# Patient Record
Sex: Female | Born: 1970 | Race: Black or African American | Hispanic: No | Smoking: Former smoker
Health system: Southern US, Community
[De-identification: ages and names within clinical notes are randomized; demographics above are authoritative.]

## PROBLEM LIST (undated history)

## (undated) DIAGNOSIS — R519 Headache, unspecified: Secondary | ICD-10-CM

## (undated) DIAGNOSIS — F32A Depression, unspecified: Secondary | ICD-10-CM

## (undated) DIAGNOSIS — Z8679 Personal history of other diseases of the circulatory system: Secondary | ICD-10-CM

## (undated) DIAGNOSIS — F329 Major depressive disorder, single episode, unspecified: Secondary | ICD-10-CM

## (undated) DIAGNOSIS — K219 Gastro-esophageal reflux disease without esophagitis: Secondary | ICD-10-CM

## (undated) DIAGNOSIS — I252 Old myocardial infarction: Secondary | ICD-10-CM

## (undated) DIAGNOSIS — K589 Irritable bowel syndrome without diarrhea: Secondary | ICD-10-CM

## (undated) DIAGNOSIS — F419 Anxiety disorder, unspecified: Secondary | ICD-10-CM

## (undated) DIAGNOSIS — Z8639 Personal history of other endocrine, nutritional and metabolic disease: Secondary | ICD-10-CM

## (undated) DIAGNOSIS — Z862 Personal history of diseases of the blood and blood-forming organs and certain disorders involving the immune mechanism: Secondary | ICD-10-CM

## (undated) DIAGNOSIS — Z8759 Personal history of other complications of pregnancy, childbirth and the puerperium: Secondary | ICD-10-CM

## (undated) HISTORY — DX: Depression, unspecified: F32.A

## (undated) HISTORY — DX: Gastro-esophageal reflux disease without esophagitis: K21.9

## (undated) HISTORY — DX: Major depressive disorder, single episode, unspecified: F32.9

## (undated) HISTORY — PX: ANTERIOR CRUCIATE LIGAMENT REPAIR: SHX115

## (undated) HISTORY — PX: CARDIAC CATHETERIZATION: SHX172

## (undated) HISTORY — DX: Irritable bowel syndrome, unspecified: K58.9

---

## 1999-10-23 ENCOUNTER — Encounter: Payer: Self-pay | Admitting: Gastroenterology

## 1999-10-23 ENCOUNTER — Ambulatory Visit (HOSPITAL_COMMUNITY): Admission: RE | Admit: 1999-10-23 | Discharge: 1999-10-23 | Payer: Self-pay | Admitting: Gastroenterology

## 1999-10-24 ENCOUNTER — Ambulatory Visit (HOSPITAL_COMMUNITY): Admission: RE | Admit: 1999-10-24 | Discharge: 1999-10-24 | Payer: Self-pay | Admitting: Gastroenterology

## 1999-10-24 ENCOUNTER — Encounter: Payer: Self-pay | Admitting: Gastroenterology

## 2001-02-07 ENCOUNTER — Other Ambulatory Visit: Admission: RE | Admit: 2001-02-07 | Discharge: 2001-02-07 | Payer: Self-pay | Admitting: Family Medicine

## 2002-03-21 ENCOUNTER — Encounter: Payer: Self-pay | Admitting: Internal Medicine

## 2002-03-21 ENCOUNTER — Ambulatory Visit (HOSPITAL_COMMUNITY): Admission: RE | Admit: 2002-03-21 | Discharge: 2002-03-21 | Payer: Self-pay | Admitting: Internal Medicine

## 2002-08-08 ENCOUNTER — Encounter: Payer: Self-pay | Admitting: *Deleted

## 2002-08-08 ENCOUNTER — Inpatient Hospital Stay (HOSPITAL_COMMUNITY): Admission: AD | Admit: 2002-08-08 | Discharge: 2002-08-08 | Payer: Self-pay | Admitting: *Deleted

## 2002-08-31 ENCOUNTER — Ambulatory Visit (HOSPITAL_COMMUNITY): Admission: RE | Admit: 2002-08-31 | Discharge: 2002-08-31 | Payer: Self-pay | Admitting: *Deleted

## 2002-10-26 ENCOUNTER — Ambulatory Visit (HOSPITAL_COMMUNITY): Admission: RE | Admit: 2002-10-26 | Discharge: 2002-10-26 | Payer: Self-pay | Admitting: *Deleted

## 2002-11-23 ENCOUNTER — Inpatient Hospital Stay (HOSPITAL_COMMUNITY): Admission: AD | Admit: 2002-11-23 | Discharge: 2002-11-23 | Payer: Self-pay | Admitting: *Deleted

## 2002-12-10 DIAGNOSIS — Z8679 Personal history of other diseases of the circulatory system: Secondary | ICD-10-CM

## 2002-12-10 DIAGNOSIS — I10 Essential (primary) hypertension: Secondary | ICD-10-CM

## 2002-12-10 HISTORY — DX: Essential (primary) hypertension: I10

## 2002-12-10 HISTORY — DX: Personal history of other diseases of the circulatory system: Z86.79

## 2003-02-01 ENCOUNTER — Inpatient Hospital Stay (HOSPITAL_COMMUNITY): Admission: AD | Admit: 2003-02-01 | Discharge: 2003-02-01 | Payer: Self-pay | Admitting: *Deleted

## 2003-02-04 ENCOUNTER — Inpatient Hospital Stay (HOSPITAL_COMMUNITY): Admission: AD | Admit: 2003-02-04 | Discharge: 2003-02-13 | Payer: Self-pay | Admitting: *Deleted

## 2003-02-05 ENCOUNTER — Encounter: Payer: Self-pay | Admitting: Pulmonary Disease

## 2003-02-05 ENCOUNTER — Encounter: Payer: Self-pay | Admitting: Family Medicine

## 2003-02-05 DIAGNOSIS — Z8759 Personal history of other complications of pregnancy, childbirth and the puerperium: Secondary | ICD-10-CM

## 2003-02-05 DIAGNOSIS — Z862 Personal history of diseases of the blood and blood-forming organs and certain disorders involving the immune mechanism: Secondary | ICD-10-CM

## 2003-02-05 HISTORY — DX: Personal history of other complications of pregnancy, childbirth and the puerperium: Z87.59

## 2003-02-05 HISTORY — DX: Personal history of diseases of the blood and blood-forming organs and certain disorders involving the immune mechanism: Z86.2

## 2003-02-06 ENCOUNTER — Encounter: Payer: Self-pay | Admitting: Critical Care Medicine

## 2003-02-07 ENCOUNTER — Encounter: Payer: Self-pay | Admitting: Pulmonary Disease

## 2003-02-08 ENCOUNTER — Encounter: Payer: Self-pay | Admitting: Pulmonary Disease

## 2003-02-08 ENCOUNTER — Encounter: Payer: Self-pay | Admitting: Cardiology

## 2003-02-09 ENCOUNTER — Encounter: Payer: Self-pay | Admitting: Pulmonary Disease

## 2003-02-10 ENCOUNTER — Encounter: Payer: Self-pay | Admitting: Pulmonary Disease

## 2003-02-15 ENCOUNTER — Inpatient Hospital Stay (HOSPITAL_COMMUNITY): Admission: AD | Admit: 2003-02-15 | Discharge: 2003-02-23 | Payer: Self-pay | Admitting: *Deleted

## 2003-02-25 ENCOUNTER — Encounter: Admission: RE | Admit: 2003-02-25 | Discharge: 2003-02-25 | Payer: Self-pay | Admitting: Obstetrics and Gynecology

## 2003-04-02 ENCOUNTER — Encounter: Admission: RE | Admit: 2003-04-02 | Discharge: 2003-04-02 | Payer: Self-pay | Admitting: Family Medicine

## 2003-04-16 ENCOUNTER — Encounter: Admission: RE | Admit: 2003-04-16 | Discharge: 2003-04-16 | Payer: Self-pay | Admitting: Family Medicine

## 2003-04-20 ENCOUNTER — Ambulatory Visit (HOSPITAL_BASED_OUTPATIENT_CLINIC_OR_DEPARTMENT_OTHER): Admission: RE | Admit: 2003-04-20 | Discharge: 2003-04-21 | Payer: Self-pay | Admitting: Orthopaedic Surgery

## 2003-05-06 ENCOUNTER — Encounter: Admission: RE | Admit: 2003-05-06 | Discharge: 2003-05-06 | Payer: Self-pay | Admitting: Obstetrics and Gynecology

## 2003-05-19 ENCOUNTER — Encounter: Admission: RE | Admit: 2003-05-19 | Discharge: 2003-06-07 | Payer: Self-pay | Admitting: Orthopaedic Surgery

## 2003-06-23 ENCOUNTER — Ambulatory Visit (HOSPITAL_COMMUNITY): Admission: RE | Admit: 2003-06-23 | Discharge: 2003-06-23 | Payer: Self-pay | Admitting: Cardiology

## 2003-06-23 ENCOUNTER — Encounter: Payer: Self-pay | Admitting: Cardiology

## 2003-07-01 ENCOUNTER — Ambulatory Visit (HOSPITAL_COMMUNITY): Admission: RE | Admit: 2003-07-01 | Discharge: 2003-07-01 | Payer: Self-pay | Admitting: Cardiology

## 2004-02-07 IMAGING — CT CT ABDOMEN W/ CM
1 series · 15 of 32 positions shown, 19 images · non-contrast
Comparison: none

FINDINGS
CLINICAL DATA: 31 YEAR OLD WITH  PELVIS PAIN, ELEVATED WHITE COUNT. PATENT IS STATUS POST VAGINAL
DELIVERY OF 02/05/03.
EXAMINATION OF THE LUNG BASES DEMONSTRATE SMALL BILATERAL PLEURAL EFFUSIONS.  THERE IS BIBASILAR
ATELECTASIS AND PATCHY RIGHT LOWER LOBE INFILTRATE.
CT ABDOMEN
THE LIVER, SPLEEN, PANCREAS, ADRENAL GLANDS AND KIDNEYS DEMONSTRATE NO SIGNIFICANT FINDINGS.  THERE
IS A TINY CYST IN THE LEFT KIDNEY.  THE RIGHT OVARIAN VEIN IS ENLARGED AND APPEARS TO BE CLOT
WITHIN IT.   THE LEFT OVARIAN VEIN IS NOT AS ENLARGED BUT MAY ALSO HAVE A SMALL AMOUNT OF CLOT
WITHIN IT. THE FINDINGS MAY SUGGEST INFECTIOUS THROMBOPHLEBITIS OF THE RIGHT OVARIAN VEIN.   NO
MESENTERIC OR RETROPERITONEAL MASSES OR ADENOPATHY.
IMPRESSION
1.  SMALL BILATERAL PLEURAL EFFUSIONS, BIBASILAR ATELECTASIS AND PROBABLE RIGHT BASILAR INFILTRATE.
2.  RIGHT OVARIAN VEIN THROMBOSIS MAY BE AN INFECTIOUS THROMBOPHLEBITIS.   THE LEFT OVARIAN VEIN
MAY ALSO HAVE SOME THROMBUS IN IT.
CT PELVIS
THE UTERUS IS STILL ENLARGED. THERE IS FLUID AND SOME AIR IN THE ENDOMETRIUM WHICH IS NOT AN
EXPECTED FINDING THIS FAR OUT AFTER DELIVERY AND ENDOMETRITIS WOULD BE A CONSIDERATION.   NO FREE
PELVIC FLUID COLLECTIONS OR DRAINABLE PELVIC ABSCESS.  THE RECTUM AND SIGMOID COLON ARE
UNREMARKABLE. THERE IS A CYSTIC STRUCTURE IN THE RIGHT LABIA.
1.  RIGHT OVARIAN VEIN THROMBOSIS.  THERE MAY BE SOME CLOT IN THE LEFT OVARIAN VEIN ALSO.  ENLARGED
UTERUS WITH SMALL AMOUNT OF AIR AND SOME FLUID IN THE ENDOMETRIUM COULD NOT EXCLUDE ENDOMETRITIS.
2.  NO PELVIC ABSCESS.

[Series 2: abd pelvis · axial · 0.75mm/px · z∈[-288,+62]mm · 15 of 106 slices shown, 19 images]
[im 7/106  soft-tissue]
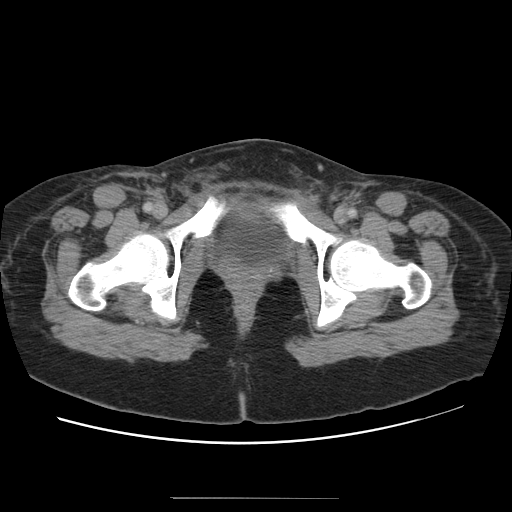
[im 7/106  bone]
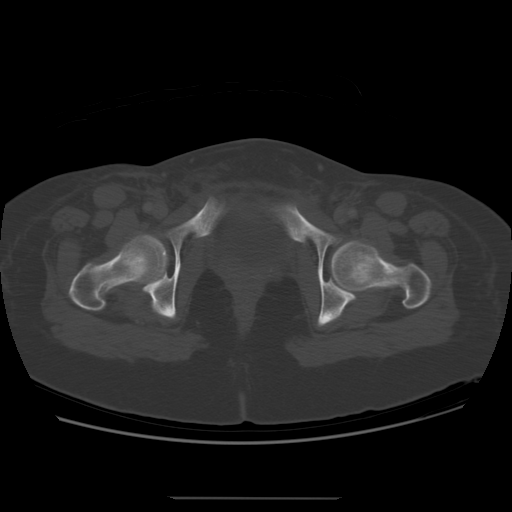
[im 14/106  soft-tissue]
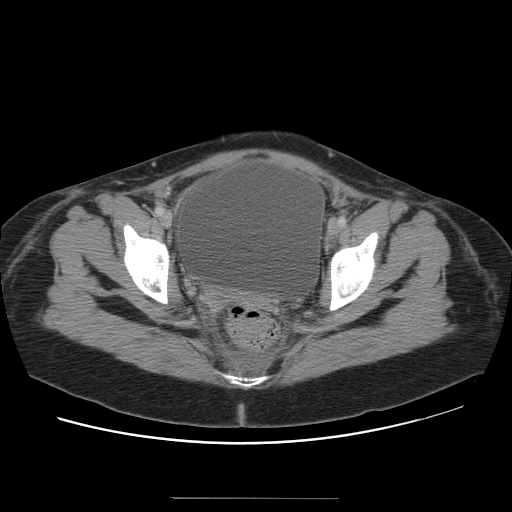
[im 21/106  soft-tissue]
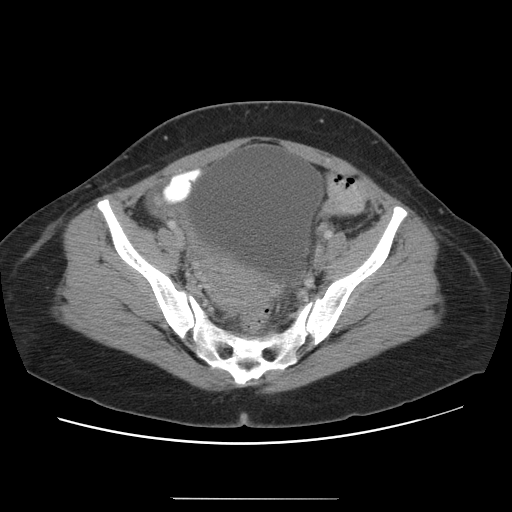
[im 31/106  soft-tissue]
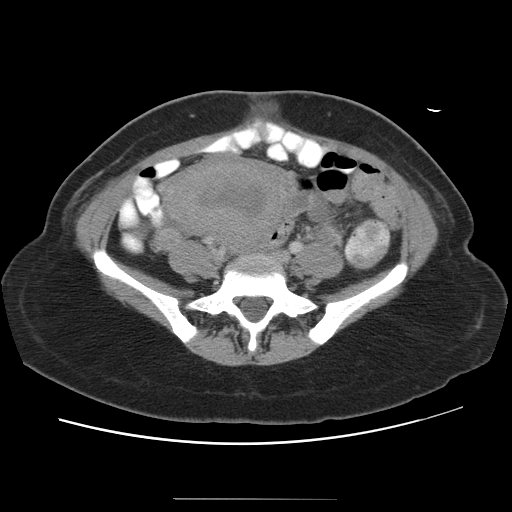
[im 38/106  soft-tissue]
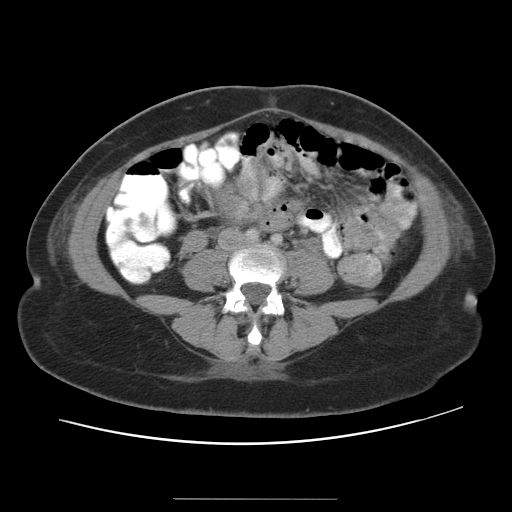
[im 45/106  soft-tissue]
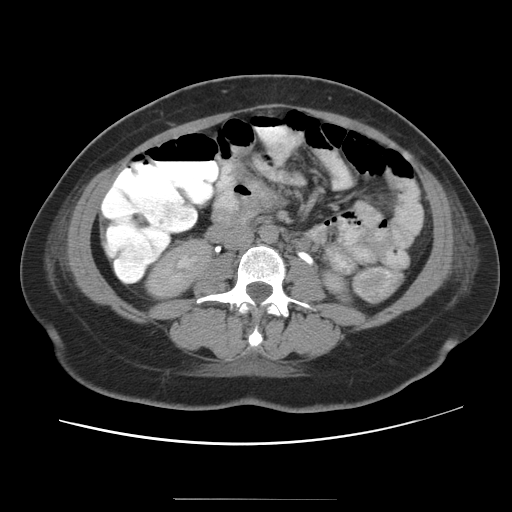
[im 55/106  soft-tissue]
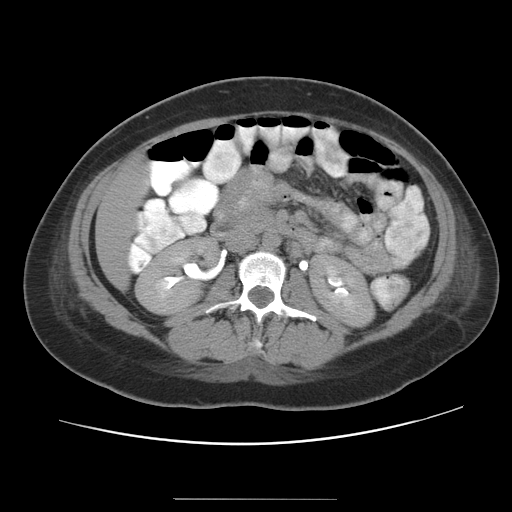
[im 61/106  soft-tissue]
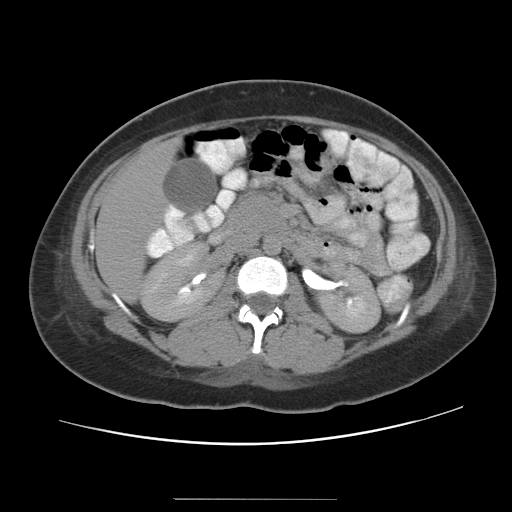
[im 68/106  soft-tissue]
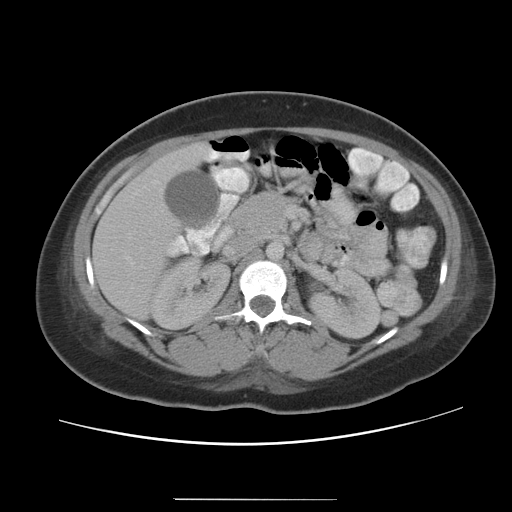
[im 68/106  bone]
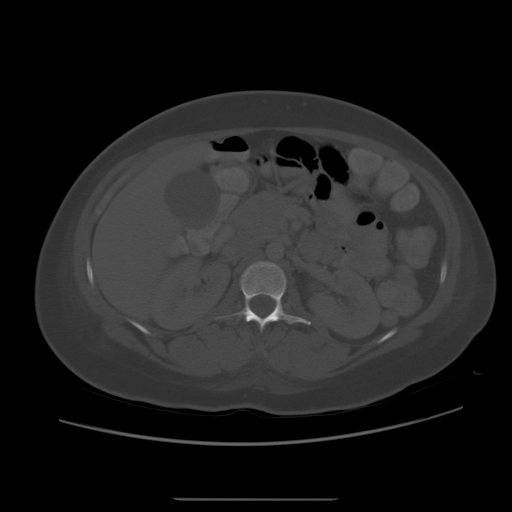
[im 75/106  soft-tissue]
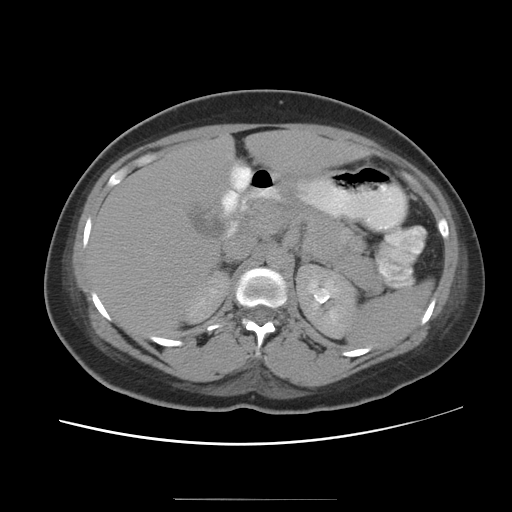
[im 85/106  soft-tissue]
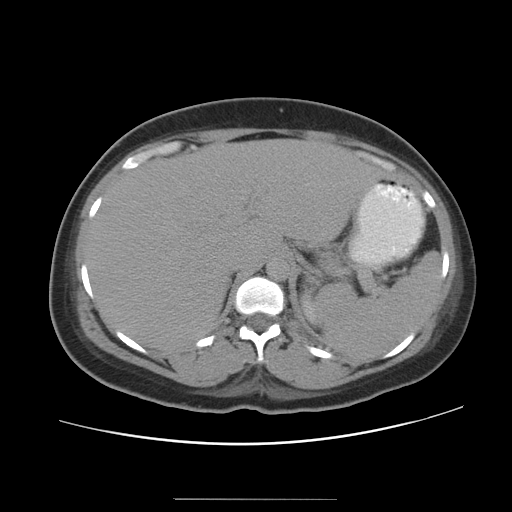
[im 92/106  soft-tissue]
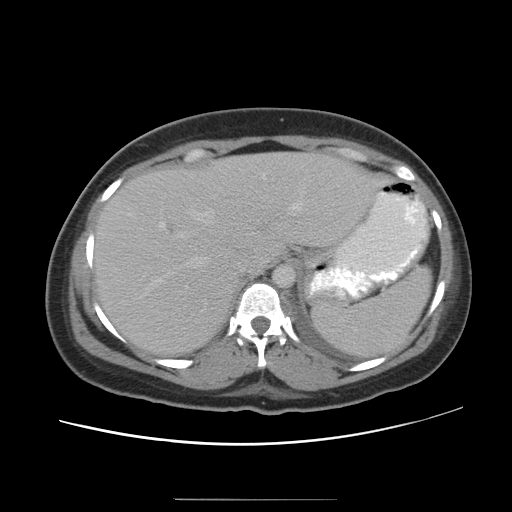
[im 92/106  lung]
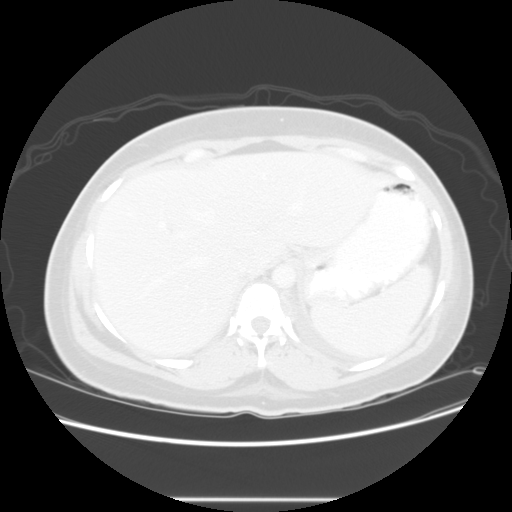
[im 95/106  lung]
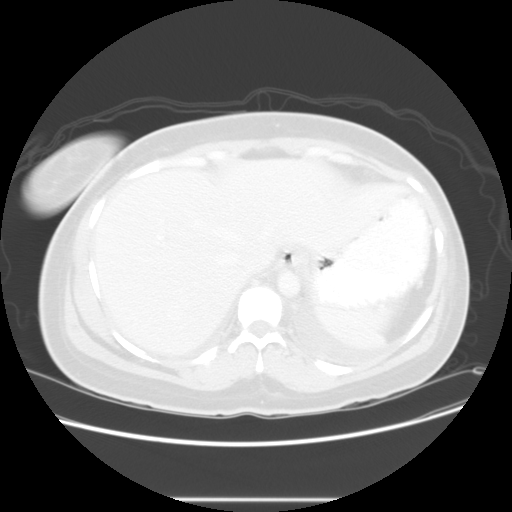
[im 99/106  soft-tissue]
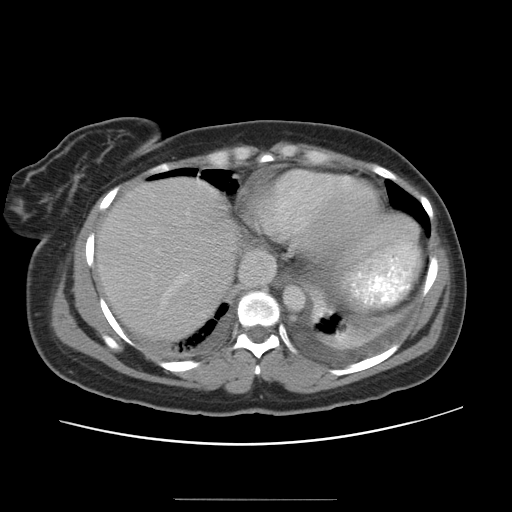
[im 99/106  lung]
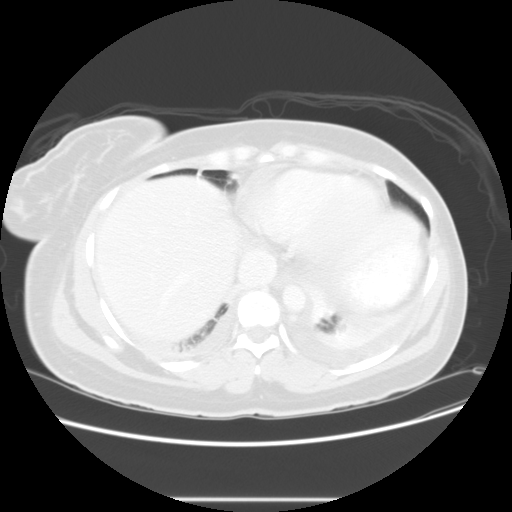
[im 102/106  lung]
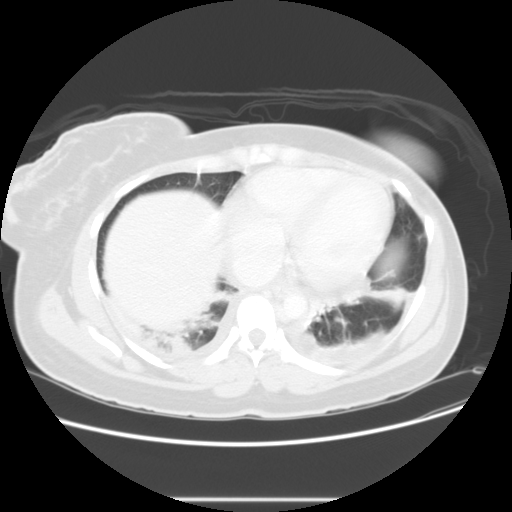

[15 of 32 positions shown; findings below may reference images not displayed]

## 2004-11-10 ENCOUNTER — Ambulatory Visit: Payer: Self-pay | Admitting: Family Medicine

## 2004-12-01 ENCOUNTER — Ambulatory Visit: Payer: Self-pay | Admitting: Family Medicine

## 2004-12-12 ENCOUNTER — Ambulatory Visit: Payer: Self-pay | Admitting: Internal Medicine

## 2004-12-13 ENCOUNTER — Ambulatory Visit: Payer: Self-pay | Admitting: Family Medicine

## 2004-12-13 ENCOUNTER — Ambulatory Visit: Payer: Self-pay | Admitting: *Deleted

## 2004-12-26 ENCOUNTER — Ambulatory Visit: Payer: Self-pay | Admitting: Family Medicine

## 2004-12-27 ENCOUNTER — Ambulatory Visit: Payer: Self-pay | Admitting: Family Medicine

## 2005-01-29 ENCOUNTER — Ambulatory Visit: Payer: Self-pay | Admitting: Nurse Practitioner

## 2005-05-03 ENCOUNTER — Ambulatory Visit: Payer: Self-pay | Admitting: Nurse Practitioner

## 2005-08-24 ENCOUNTER — Ambulatory Visit: Payer: Self-pay | Admitting: Internal Medicine

## 2005-09-20 ENCOUNTER — Ambulatory Visit: Payer: Self-pay | Admitting: Nurse Practitioner

## 2005-11-22 ENCOUNTER — Ambulatory Visit: Payer: Self-pay | Admitting: Family Medicine

## 2005-11-28 ENCOUNTER — Ambulatory Visit: Payer: Self-pay | Admitting: Nurse Practitioner

## 2006-01-29 ENCOUNTER — Ambulatory Visit: Payer: Self-pay | Admitting: Family Medicine

## 2006-01-31 ENCOUNTER — Ambulatory Visit: Payer: Self-pay | Admitting: Nurse Practitioner

## 2006-02-18 ENCOUNTER — Encounter (INDEPENDENT_AMBULATORY_CARE_PROVIDER_SITE_OTHER): Payer: Self-pay | Admitting: Nurse Practitioner

## 2006-02-26 ENCOUNTER — Ambulatory Visit: Payer: Self-pay | Admitting: Nurse Practitioner

## 2006-03-06 ENCOUNTER — Ambulatory Visit (HOSPITAL_COMMUNITY): Admission: RE | Admit: 2006-03-06 | Discharge: 2006-03-06 | Payer: Self-pay | Admitting: Family Medicine

## 2006-03-19 ENCOUNTER — Ambulatory Visit: Payer: Self-pay | Admitting: Nurse Practitioner

## 2006-03-21 ENCOUNTER — Encounter: Admission: RE | Admit: 2006-03-21 | Discharge: 2006-03-21 | Payer: Self-pay | Admitting: Nurse Practitioner

## 2006-08-15 ENCOUNTER — Ambulatory Visit: Payer: Self-pay | Admitting: Nurse Practitioner

## 2006-08-19 ENCOUNTER — Encounter (INDEPENDENT_AMBULATORY_CARE_PROVIDER_SITE_OTHER): Payer: Self-pay | Admitting: Nurse Practitioner

## 2006-08-19 LAB — CONVERTED CEMR LAB: TSH: 2.233 microintl units/mL

## 2006-08-26 ENCOUNTER — Ambulatory Visit (HOSPITAL_COMMUNITY): Admission: RE | Admit: 2006-08-26 | Discharge: 2006-08-26 | Payer: Self-pay | Admitting: Family Medicine

## 2006-09-27 ENCOUNTER — Ambulatory Visit: Payer: Self-pay | Admitting: Cardiology

## 2006-10-11 ENCOUNTER — Ambulatory Visit: Payer: Self-pay

## 2006-10-18 ENCOUNTER — Ambulatory Visit: Payer: Self-pay | Admitting: Cardiology

## 2006-10-24 ENCOUNTER — Ambulatory Visit: Payer: Self-pay | Admitting: Nurse Practitioner

## 2006-11-07 ENCOUNTER — Encounter (INDEPENDENT_AMBULATORY_CARE_PROVIDER_SITE_OTHER): Payer: Self-pay | Admitting: Gynecology

## 2006-11-07 ENCOUNTER — Ambulatory Visit: Payer: Self-pay | Admitting: Gynecology

## 2007-03-17 ENCOUNTER — Ambulatory Visit: Payer: Self-pay | Admitting: Nurse Practitioner

## 2007-03-17 LAB — CONVERTED CEMR LAB
RBC count: 4.05 10*6/uL
WBC, blood: 7 10*3/uL

## 2007-06-18 ENCOUNTER — Ambulatory Visit (HOSPITAL_COMMUNITY): Admission: RE | Admit: 2007-06-18 | Discharge: 2007-06-18 | Payer: Self-pay | Admitting: Family Medicine

## 2007-06-26 ENCOUNTER — Ambulatory Visit: Payer: Self-pay | Admitting: Gynecology

## 2007-07-17 ENCOUNTER — Ambulatory Visit: Payer: Self-pay | Admitting: Family Medicine

## 2007-07-30 ENCOUNTER — Encounter: Payer: Self-pay | Admitting: Nurse Practitioner

## 2007-07-30 DIAGNOSIS — F339 Major depressive disorder, recurrent, unspecified: Secondary | ICD-10-CM | POA: Insufficient documentation

## 2007-07-30 DIAGNOSIS — O8813 Amniotic fluid embolism in the puerperium: Secondary | ICD-10-CM | POA: Insufficient documentation

## 2007-07-30 DIAGNOSIS — I252 Old myocardial infarction: Secondary | ICD-10-CM

## 2007-07-30 DIAGNOSIS — Z8674 Personal history of sudden cardiac arrest: Secondary | ICD-10-CM

## 2007-07-30 DIAGNOSIS — E039 Hypothyroidism, unspecified: Secondary | ICD-10-CM

## 2007-07-30 DIAGNOSIS — F329 Major depressive disorder, single episode, unspecified: Secondary | ICD-10-CM

## 2007-07-31 ENCOUNTER — Ambulatory Visit: Payer: Self-pay | Admitting: Obstetrics & Gynecology

## 2007-08-14 ENCOUNTER — Ambulatory Visit: Payer: Self-pay | Admitting: *Deleted

## 2007-08-27 ENCOUNTER — Encounter (INDEPENDENT_AMBULATORY_CARE_PROVIDER_SITE_OTHER): Payer: Self-pay | Admitting: *Deleted

## 2007-08-28 ENCOUNTER — Ambulatory Visit: Payer: Self-pay | Admitting: Family Medicine

## 2007-09-04 ENCOUNTER — Ambulatory Visit: Payer: Self-pay | Admitting: Obstetrics and Gynecology

## 2007-09-04 ENCOUNTER — Inpatient Hospital Stay (HOSPITAL_COMMUNITY): Admission: AD | Admit: 2007-09-04 | Discharge: 2007-09-05 | Payer: Self-pay | Admitting: Obstetrics & Gynecology

## 2007-09-10 ENCOUNTER — Ambulatory Visit (HOSPITAL_COMMUNITY): Admission: RE | Admit: 2007-09-10 | Discharge: 2007-09-10 | Payer: Self-pay | Admitting: Obstetrics & Gynecology

## 2007-09-15 ENCOUNTER — Ambulatory Visit: Payer: Self-pay | Admitting: Family Medicine

## 2007-09-29 ENCOUNTER — Ambulatory Visit: Payer: Self-pay | Admitting: *Deleted

## 2007-10-13 ENCOUNTER — Ambulatory Visit: Payer: Self-pay | Admitting: Obstetrics & Gynecology

## 2007-10-20 ENCOUNTER — Ambulatory Visit: Payer: Self-pay | Admitting: Obstetrics & Gynecology

## 2007-10-27 ENCOUNTER — Ambulatory Visit: Payer: Self-pay | Admitting: Obstetrics & Gynecology

## 2007-11-03 ENCOUNTER — Ambulatory Visit: Payer: Self-pay | Admitting: Obstetrics & Gynecology

## 2007-11-10 ENCOUNTER — Ambulatory Visit: Payer: Self-pay | Admitting: *Deleted

## 2007-11-17 ENCOUNTER — Inpatient Hospital Stay (HOSPITAL_COMMUNITY): Admission: AD | Admit: 2007-11-17 | Discharge: 2007-11-19 | Payer: Self-pay | Admitting: Obstetrics & Gynecology

## 2007-11-17 ENCOUNTER — Ambulatory Visit: Payer: Self-pay | Admitting: Obstetrics & Gynecology

## 2007-11-17 ENCOUNTER — Ambulatory Visit: Payer: Self-pay | Admitting: Obstetrics and Gynecology

## 2007-11-25 ENCOUNTER — Encounter: Payer: Self-pay | Admitting: Obstetrics & Gynecology

## 2007-11-25 ENCOUNTER — Ambulatory Visit: Payer: Self-pay | Admitting: Obstetrics & Gynecology

## 2007-11-25 ENCOUNTER — Ambulatory Visit (HOSPITAL_COMMUNITY): Admission: AD | Admit: 2007-11-25 | Discharge: 2007-11-26 | Payer: Self-pay | Admitting: Obstetrics & Gynecology

## 2008-04-02 ENCOUNTER — Ambulatory Visit: Payer: Self-pay | Admitting: Internal Medicine

## 2008-12-01 ENCOUNTER — Ambulatory Visit: Payer: Self-pay | Admitting: Internal Medicine

## 2008-12-01 ENCOUNTER — Encounter (INDEPENDENT_AMBULATORY_CARE_PROVIDER_SITE_OTHER): Payer: Self-pay | Admitting: Internal Medicine

## 2008-12-01 LAB — CONVERTED CEMR LAB
ALT: 11 units/L (ref 0–35)
AST: 16 units/L (ref 0–37)
Alkaline Phosphatase: 76 units/L (ref 39–117)
Basophils Absolute: 0 10*3/uL (ref 0.0–0.1)
CO2: 24 meq/L (ref 19–32)
Eosinophils Relative: 2 % (ref 0–5)
HCT: 40.7 % (ref 36.0–46.0)
Lymphocytes Relative: 22 % (ref 12–46)
Lymphs Abs: 1.4 10*3/uL (ref 0.7–4.0)
MCHC: 31.9 g/dL (ref 30.0–36.0)
MCV: 94.9 fL (ref 78.0–100.0)
Monocytes Absolute: 0.5 10*3/uL (ref 0.1–1.0)
Neutro Abs: 4.2 10*3/uL (ref 1.7–7.7)
Neutrophils Relative %: 67 % (ref 43–77)
RDW: 14.3 % (ref 11.5–15.5)
Total Bilirubin: 0.5 mg/dL (ref 0.3–1.2)
WBC: 6.2 10*3/uL (ref 4.0–10.5)

## 2009-01-11 ENCOUNTER — Ambulatory Visit: Payer: Self-pay | Admitting: Internal Medicine

## 2009-05-04 ENCOUNTER — Ambulatory Visit: Payer: Self-pay | Admitting: Internal Medicine

## 2009-06-08 ENCOUNTER — Ambulatory Visit: Payer: Self-pay | Admitting: Family Medicine

## 2009-06-14 ENCOUNTER — Ambulatory Visit: Payer: Self-pay | Admitting: Internal Medicine

## 2009-08-12 ENCOUNTER — Emergency Department (HOSPITAL_COMMUNITY): Admission: EM | Admit: 2009-08-12 | Discharge: 2009-08-12 | Payer: Self-pay | Admitting: Emergency Medicine

## 2009-08-24 ENCOUNTER — Ambulatory Visit: Payer: Self-pay | Admitting: Internal Medicine

## 2009-08-24 ENCOUNTER — Encounter (INDEPENDENT_AMBULATORY_CARE_PROVIDER_SITE_OTHER): Payer: Self-pay | Admitting: Internal Medicine

## 2010-03-03 ENCOUNTER — Ambulatory Visit: Payer: Self-pay | Admitting: Internal Medicine

## 2010-03-28 ENCOUNTER — Ambulatory Visit: Payer: Self-pay | Admitting: Internal Medicine

## 2010-03-28 LAB — CONVERTED CEMR LAB
Direct LDL: 69 mg/dL
Helicobacter Pylori Antibody-IgG: <0.4

## 2010-04-19 ENCOUNTER — Ambulatory Visit: Payer: Self-pay | Admitting: Internal Medicine

## 2010-05-04 ENCOUNTER — Ambulatory Visit: Payer: Self-pay | Admitting: Internal Medicine

## 2010-05-09 ENCOUNTER — Encounter (INDEPENDENT_AMBULATORY_CARE_PROVIDER_SITE_OTHER): Payer: Self-pay | Admitting: Interventional Cardiology

## 2010-05-09 ENCOUNTER — Ambulatory Visit (HOSPITAL_COMMUNITY): Admission: RE | Admit: 2010-05-09 | Discharge: 2010-05-09 | Payer: Self-pay | Admitting: Interventional Cardiology

## 2010-05-09 HISTORY — PX: TRANSTHORACIC ECHOCARDIOGRAM: SHX275

## 2010-06-06 ENCOUNTER — Ambulatory Visit: Payer: Self-pay | Admitting: Internal Medicine

## 2010-06-06 LAB — CONVERTED CEMR LAB
Basophils Relative: 0 % (ref 0–1)
Eosinophils Relative: 2 % (ref 0–5)
Ferritin: 28 ng/mL (ref 10–291)
IgE (Immunoglobulin E), Serum: 16.2 intl units/mL (ref 0.0–180.0)
Iron: 77 ug/dL (ref 42–145)
Lymphocytes Relative: 28 % (ref 12–46)
Lymphs Abs: 1.9 10*3/uL (ref 0.7–4.0)
MCHC: 32.4 g/dL (ref 30.0–36.0)
Monocytes Absolute: 0.3 10*3/uL (ref 0.1–1.0)
Neutro Abs: 4.4 10*3/uL (ref 1.7–7.7)
Neutrophils Relative %: 65 % (ref 43–77)
RDW: 15.7 % — ABNORMAL HIGH (ref 11.5–15.5)

## 2010-06-08 ENCOUNTER — Ambulatory Visit (HOSPITAL_COMMUNITY): Admission: RE | Admit: 2010-06-08 | Discharge: 2010-06-08 | Payer: Self-pay | Admitting: Internal Medicine

## 2010-07-04 ENCOUNTER — Ambulatory Visit: Payer: Self-pay | Admitting: Internal Medicine

## 2010-07-14 ENCOUNTER — Ambulatory Visit: Payer: Self-pay | Admitting: Internal Medicine

## 2010-07-14 LAB — CONVERTED CEMR LAB
ANA Titer 1: 1:40 {titer} — ABNORMAL HIGH
Anti Nuclear Antibody(ANA): POSITIVE — AB
CRP: 0 mg/dL (ref <0.6)

## 2010-12-31 ENCOUNTER — Encounter: Payer: Self-pay | Admitting: *Deleted

## 2011-01-01 ENCOUNTER — Encounter: Payer: Self-pay | Admitting: *Deleted

## 2011-01-10 ENCOUNTER — Encounter (INDEPENDENT_AMBULATORY_CARE_PROVIDER_SITE_OTHER): Payer: Self-pay | Admitting: Family Medicine

## 2011-01-10 LAB — CONVERTED CEMR LAB
ALT: 12 units/L (ref 0–35)
AST: 16 units/L (ref 0–37)
Albumin: 4.3 g/dL (ref 3.5–5.2)
CO2: 25 meq/L (ref 19–32)
Creatinine, Ser: 0.77 mg/dL (ref 0.40–1.20)
Glucose, Bld: 76 mg/dL (ref 70–99)
Hemoglobin: 12.4 g/dL (ref 12.0–15.0)
MCHC: 32.5 g/dL (ref 30.0–36.0)
MCV: 96.9 fL (ref 78.0–100.0)
Monocytes Relative: 9 % (ref 3–12)
Neutro Abs: 3.8 10*3/uL (ref 1.7–7.7)
Neutrophils Relative %: 59 % (ref 43–77)
Potassium: 4 meq/L (ref 3.5–5.3)
RBC: 3.93 M/uL (ref 3.87–5.11)
RDW: 14.2 % (ref 11.5–15.5)
Total Bilirubin: 0.6 mg/dL (ref 0.3–1.2)
Total Protein: 6.8 g/dL (ref 6.0–8.3)
Vit D, 25-Hydroxy: 18 ng/mL — ABNORMAL LOW (ref 30–89)
WBC: 6.4 10*3/uL (ref 4.0–10.5)

## 2011-01-11 ENCOUNTER — Inpatient Hospital Stay (HOSPITAL_COMMUNITY)
Admission: AD | Admit: 2011-01-11 | Discharge: 2011-01-11 | Disposition: A | Discharge status: Home / Self Care | Payer: Self-pay | Source: Ambulatory Visit | Attending: Family Medicine | Admitting: Family Medicine

## 2011-01-11 ENCOUNTER — Inpatient Hospital Stay (HOSPITAL_COMMUNITY): Payer: Self-pay

## 2011-01-11 DIAGNOSIS — N949 Unspecified condition associated with female genital organs and menstrual cycle: Secondary | ICD-10-CM | POA: Insufficient documentation

## 2011-01-11 DIAGNOSIS — N938 Other specified abnormal uterine and vaginal bleeding: Secondary | ICD-10-CM | POA: Insufficient documentation

## 2011-01-11 LAB — CBC
HCT: 36.5 % (ref 36.0–46.0)
MCH: 31.1 pg (ref 26.0–34.0)
MCHC: 32.3 g/dL (ref 30.0–36.0)
MCV: 96.1 fL (ref 78.0–100.0)
RBC: 3.8 MIL/uL — ABNORMAL LOW (ref 3.87–5.11)
RDW: 14.3 % (ref 11.5–15.5)
WBC: 5.3 10*3/uL (ref 4.0–10.5)

## 2011-01-11 LAB — WET PREP, GENITAL

## 2011-01-12 LAB — GC/CHLAMYDIA PROBE AMP, GENITAL: Chlamydia, DNA Probe: NEGATIVE

## 2011-01-25 ENCOUNTER — Encounter: Payer: Self-pay | Admitting: Advanced Practice Midwife

## 2011-02-12 ENCOUNTER — Encounter: Payer: Self-pay | Admitting: Advanced Practice Midwife

## 2011-02-12 ENCOUNTER — Other Ambulatory Visit: Payer: Self-pay

## 2011-02-12 ENCOUNTER — Encounter (INDEPENDENT_AMBULATORY_CARE_PROVIDER_SITE_OTHER): Payer: Self-pay | Admitting: Occupational Therapy

## 2011-02-12 DIAGNOSIS — N920 Excessive and frequent menstruation with regular cycle: Secondary | ICD-10-CM

## 2011-02-12 LAB — CONVERTED CEMR LAB: TSH: 3.54 microintl units/mL (ref 0.350–4.500)
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
## 2011-03-16 LAB — CBC
HCT: 40.1 % (ref 36.0–46.0)
Hemoglobin: 13.4 g/dL (ref 12.0–15.0)
MCHC: 33.4 g/dL (ref 30.0–36.0)
MCV: 97.9 fL (ref 78.0–100.0)
RDW: 15.3 % (ref 11.5–15.5)

## 2011-03-16 LAB — COMPREHENSIVE METABOLIC PANEL
ALT: 20 U/L (ref 0–35)
BUN: 11 mg/dL (ref 6–23)
Calcium: 9.2 mg/dL (ref 8.4–10.5)
Chloride: 111 mEq/L (ref 96–112)
Creatinine, Ser: 1.02 mg/dL (ref 0.4–1.2)
GFR calc Af Amer: >60 mL/min (ref >60)
GFR calc non Af Amer: >60 mL/min (ref >60)
Total Protein: 6.7 g/dL (ref 6.0–8.3)

## 2011-03-16 LAB — URINALYSIS, ROUTINE W REFLEX MICROSCOPIC
Hgb urine dipstick: NEGATIVE
Ketones, ur: >80 mg/dL — AB
Specific Gravity, Urine: 1.029 (ref 1.005–1.030)
pH: 5.5 (ref 5.0–8.0)

## 2011-03-16 LAB — URINE MICROSCOPIC-ADD ON

## 2011-03-16 LAB — DIFFERENTIAL: Monocytes Absolute: 0.5 10*3/uL (ref 0.1–1.0)

## 2011-03-16 LAB — POCT PREGNANCY, URINE: Preg Test, Ur: NEGATIVE

## 2011-03-16 LAB — LIPASE, BLOOD: Lipase: 21 U/L (ref 11–59)

## 2011-04-24 NOTE — Op Note (Signed)
[name], [name]            ACCOUNT NO.:  [id number]   MEDICAL RECORD NO.:  [id number]          PATIENT TYPE:  INP   LOCATION:                                [facility]   PHYSICIAN:  Lesly Dukes, M.D. DATE OF BIRTH:  07-25-1971   DATE OF PROCEDURE:  11/18/2007  DATE OF DISCHARGE:                               OPERATIVE REPORT   PREOPERATIVE DIAGNOSES:  1. Postpartum day #1, spontaneous vaginal delivery.  2. Desires sterilization.   POSTOPERATIVE DIAGNOSES:  1. Postpartum day #1, spontaneous vaginal delivery.  2. Desires sterilization.   PROCEDURE:  Postpartum bilateral tubal ligation with Filshie clips.   SURGEON:  Lesly Dukes, M.D.   ASSISTANT:  Karlton Lemon, M.D.   ANESTHESIA:  Epidural.   FINDINGS:  Normal female anatomy.   ESTIMATED BLOOD LOSS:  Less than 10 ml.   DRAINS:  None.   COMPLICATIONS:  None immediate.   SPECIMENS:  None.   INDICATIONS FOR PROCEDURE:  This is a 40 year old gravida 7, para 5, 0-2-  5, that is postpartum day #1 from a spontaneous vaginal delivery.  She  desires permanent sterilization with bilateral tubal ligation.   DESCRIPTION OF PROCEDURE:  Patient was taken to the operating room and  after insuring adequate epidural anesthesia she was prepped and draped  in the usual sterile manner.  The superior and inferior portions of the  umbilicus were clamped with Allis clamps, and the skin was tented up.  A  skin incision was made through the umbilicus using a scalpel.  This was  carried down through the fascia with the scalpel.  The incision was then  opened superiorly and inferiorly with Mayo scissors.  Army[employer]y  retractors were then placed to provide good visualization of the  peritoneal cavity.  The attention was turned to the left tube, which was  easily identified and clamped with the Babcock clamp.  The second  Tanja Port was used to follow the tube out to the fimbria.  After  identification of the fimbria, the tube  was clamped with two Babcock  clamps, and a Filshie clip was placed between two Babcock clamps under  direct visualization.  The Babcock clamps were then removed from the  tube, and the tube was allowed to fall back within the peritoneal  cavity.  Attention was then turned to the right tube, which was easily  identified and clamped with a Babcock clamp.  A second Tanja Port was used  to follow the tube out to the fimbria, which was easily identified.  A  Filshie clip was then placed between the two Babcock clamps under good  visualization.  The Babcocks were then removed, and the tube was allowed  to fall back within the peritoneal cavity.  The fascia was then  reapproximated with one stitch of 0 Vicryl in unlocked fashion.  A  subcuticular stitch with 3-0 Vicryl was then used to approximate the  skin.  The patient tolerated the procedure well and went to the post  anesthesia care unit in stable condition.  All counts were correct at  the end of the procedure.  Karlton Lemon, MD  Electronically Signed     ______________________________  Lesly Dukes, M.D.    NS/MEDQ  D:  11/19/2007  T:  11/20/2007  Job:  [id number]

## 2011-04-24 NOTE — Op Note (Signed)
NAMECRISTINA, [name]            ACCOUNT NO.:  [id number]   MEDICAL RECORD NO.:  [id number]          PATIENT TYPE:  WOC   LOCATION:  WOC                          FACILITY:  [facility]   PHYSICIAN:  Allie Bossier, MD        DATE OF BIRTH:  1971/06/07   DATE OF PROCEDURE:  DATE OF DISCHARGE:                               OPERATIVE REPORT   PREOPERATIVE DIAGNOSIS:  Postpartum hemorrhage and anemia.   POSTOPERATIVE DIAGNOSIS:  Postpartum hemorrhage and anemia plus retained  products of conception.   PROCEDURE:  Curettage.   SURGEON:  Allie Bossier, M.D.   ANESTHESIA:  GETA.   COMPLICATIONS:  None.   ESTIMATED BLOOD LOSS:  800 ml.   DETAILS OF PROCEDURE/FINDINGS:  The risks, benefits and alternatives of  surgery were explained, understood, and accepted.  Consents were signed.  She was ordered 2 units of packed red blood cells to be transfused as  soon as available.  Please note her hemoglobin from the MAU was 7.8.  Doxycycline 100 mg IV was ordered to be given as well.   In the operating room, she was placed in the dorsal lithotomy position.  General anesthesia was applied without complications.  Her vagina was  prepped and draped in the usual sterile fashion.  Her bladder was  emptied with an in-and-out Foley catheter for a large amount of clear  urine.  Bimanual exam revealed a significantly enlarged uterus.  It was  at the umbilicus -2 position.  There were no adnexal masses appreciated.  A weighted speculum was placed posteriorly and a Deaver anteriorly.  The  anterior lip of the dilated cervix was grasped with a single-tooth  tenaculum.  A #12 curved suction curette was placed in the intrauterine  cavity, and suction was applied.  A large amount of tissue and blood was  obtained.  I then proceeded with a sharp curettage of all quadrants and  the fundus of the uterus until a gritty sensation was appreciated  throughout.  At this point, I reapplied suction and assured complete  emptying of the uterus.  The tenaculum was removed.  The bimanual exam  was repeated.  The uterus was noted to be significantly smaller and  firm.  During the case, she received 20 of Pitocin in her IV fluids and  20 of Pitocin IM.  She also received 1 gm of Cytotec per rectum.  At the  end of the case, there was only a normal amount of post D&C bleeding  from the cervix, and she was taken to the recovery room in stable  condition.  Instrument, sponge, and needle counts were correct.     Allie Bossier, MD  Electronically Signed    MCD/MEDQ  D:  11/25/2007  T:  11/25/2007  Job:  [id number]

## 2011-04-27 NOTE — Consult Note (Signed)
NAMELASHONTA, PILLING                      ACCOUNT NO.:  [id number]   MEDICAL RECORD NO.:  [id number]                   PATIENT TYPE:  INP   LOCATION:  9178                                 [facility]ILITY[facility]   PHYSICIAN:  Shan Levans, M.D. [facility]            DATE OF BIRTH:  Oct 17, 1971   DATE OF CONSULTATION:  02/05/2003  DATE OF DISCHARGE:                                   CONSULTATION   HISTORY OF PRESENT ILLNESS:  This is a 40 year old African-American female  status post delivery this morning.  She came in with prolonged fetal heart  tones that were variable in rate.  She was having epidural placed.  She was  brought and she came initially at midnight and through the night and had  increasing bradycardia and maternal syncope.  The patient felt itching and  restlessness.  She was taken to the labor and delivery area and had a  forceps Simpson's delivery via forceps at 0419 hours.  Post procedure the  patient's blood pressure became very low, pulse rate in the 20s.  She was  intubated and was found to have severe coagulation derangement.  Began  hemorrhaging massively from the vaginal area.  I was called to see the  patient 6:15 a.m. and came to see her at 7 a.m. and at that point multiple  individuals were in the room helping to resuscitate the patient.  We placed  a left subclavian triple lumen and a left femoral Cordis introducer and  helped with resuscitation efforts.   PHYSICAL EXAMINATION:  VITAL SIGNS:  Blood pressure variable between 80s-  100, heart rate 150s-170s, saturations 100%.  GENERAL:  The patient is orally intubated, sedated.  PELVIC:  Copious amount of blood exuding from the vaginal area being dealt  with by the obstetric physicians on duty.  CHEST:  Bilateral equal breath sounds.  ABDOMEN:  Protuberant.  CARDIAC:  Tachycardia.  EXTREMITIES:  Cool.  There were early signs of extremity clotting and  mottling.   LABORATORY DATA:  Chest x-ray showed good airway  expansion, lines and tubes  in proper position, on 100% rate of 30, tidal volume 600, pH 7.37, pCO2 22,  pO2 535.  At 6:11 a.m. the last hemoglobin we have is 10.4, platelet count  193,000, white count 11.2, PT 24, PTT 80, fibrinogen 47.  D-dimer greater  than 20.  Sodium 135, potassium 3.9, chloride 108, CO2 13, BUN 8, creatinine  0.8.   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    RECOMMENDATIONS:  Obtain hematology consultation.  Get consideration for IV  heparin.  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  Give  significant blood products.  Maintain full ventilatory support, pressors.  Shan Levans, M.D. [facility]    PW/MEDQ  D:  02/05/2003  T:  02/05/2003  Job:  [id number]   cc:   Mary Sella. Orlene Erm, M.D.  [address]  [place]  [place] [address]  Fax: [phone]

## 2011-04-27 NOTE — Group Therapy Note (Signed)
[name], [name] NO.:  [id number]   MEDICAL RECORD NO.:  [id number]          PATIENT TYPE:  WOC   LOCATION:  [facility]                   FACILITY:  [facility]   PHYSICIAN:  Ginger Carne, MD DATE OF BIRTH:  [birth year]   DATE OF SERVICE:  11/07/2006                                  CLINIC NOTE   The patient returns today for yearly examination without complaints.  She also uses Tri-Sprintec contraception and wishes a refill.  No  specific complaints at this time.  Vital signs per office record as well  as current medications.  Please refer to current medication list and GYN  visit note for written vital signs and prescriptions.     HEENT:  Grossly normal.  CHEST:  Clear.  Cardiac exam without murmurs or enlargements.  BREASTS:  Without mass, discharge, thickenings or tenderness.  Extremities, lymphatic, skin, neurological, musculoskeletal system  within normal limits.  ABDOMEN:  Soft without gross hepatosplenomegaly.  PELVIC:  Pap smear performed.  external genitalia, vulva and vagina  normal.  Cervix smooth without erosions or lesions.  Uterus small,  anteverted and flexed and both adnexa palpable, found to be normal.   IMPRESSION:  Normal gynecological examination.   PLAN:  The patient prescribed Tri-Sprintec oral contraceptives for 1  year.           ______________________________  Ginger Carne, MD     SHB/MEDQ  D:  11/07/2006  T:  11/08/2006  Job:  [id number]

## 2011-04-27 NOTE — Cardiovascular Report (Signed)
[name], [name]                      ACCOUNT NO.:  [id number]   MEDICAL RECORD NO.:  [id number]                   PATIENT TYPE:  OIB   LOCATION:  2853                                 FACILITY:  [facility]   PHYSICIAN:  Mohan N. Sharyn Lull, M.D.              DATE OF BIRTH:  23-May-1971   DATE OF PROCEDURE:  07/01/2003  DATE OF DISCHARGE:                              CARDIAC CATHETERIZATION   PROCEDURE:  Left cardiac catheterization with selective left and right  coronary angiography and left ventriculography via the right groin using  Judkins technique.   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  Complains of retrosternal chest pain sharp in nature radiating to the left  arm associated with nausea, relieved with rest in few minutes.  The patient  also complains of exertional chest pain associated with dyspnea and  palpitations.  She denies any lightheadedness or syncope.  Denies PND,  orthopnea, leg swelling.  The patient underwent stress Cardiolite on June 23, 2003 which showed reversible ischemia in the mid inferior wall with EF  of 52%.  Due to recurrent chest pain and positive stress Cardiolite and  prior myocardial infarction in the past, the patient was advised on left  cath and possible percutaneous transluminal coronary angioplasty and  stenting.   PAST MEDICAL HISTORY:  As above.   PAST SURGICAL HISTORY:  She had left knee surgery in May 2004.   ALLERGIES:  No known drug allergies.   MEDICATIONS:  1. Baby aspirin 81 mg p.o. daily.  2. Nitrostat 0.4 mg sublingual p.r.n.   SOCIAL HISTORY:  She is married and has four children.  No history of  smoking.  Drinks beer and wine occasionally, socially.  Used to work at [employer]  [employer].  Presently, she is unemployed.   FAMILY HISTORY:  Father is  alive.  He is hypertensive.  He has peripheral  vascular disease. Mother is alive and in good health.  She has five sisters  and two brothers in good health.   PHYSICAL EXAMINATION:  GENERAL:  She was alert, awake, oriented x3 in no  acute distress.  VITAL SIGNS:  Blood pressure 114/76, pulse 64.  HEENT:  Conjunctivae pink.  NECK:  Supple.  No jugular venous distention.  No bruit.  LUNGS:  Clear to auscultation without rhonchi or rales.  CARDIOVASCULAR:  S1, S2 was normal.  There was no S3, gallop or murmur.  ABDOMEN:  Soft.  Bowel sounds are present.  Nontender.  EXTREMITIES:  No clubbing, cyanosis or edema.   IMPRESSION:  1. Recurrent chest pain.  2. Positive stress Cardiolite.  3. Coronary artery disease, status post non-Q-wave myocardial infarction in     February 2004.  4. Status post __________, prolonged hypertension in February 2004.   Discussed with the  patient regarding stress test findings and left cath,  possible percutaneous transluminal coronary angioplasty and stenting its  risks, i.e.; death, myocardial infarction, stroke, need for emergency  coronary artery bypass graft, risk of restenosis, local vascular  complications, etc., and consented for PCI.   PROCEDURE:  After obtaining informed consent, the patient was brought to the  cath lab and was placed on fluoroscopy table. Right groin was prepped and  draped in usual fashion.  2% Xylocaine was used for local anesthesia in the  right groin.  With the help of thin-wall needle, a 6-French arterial sheath  was placed.  The sheath was aspirated and flushed.  Next, 6-French left  Judkins catheter was advanced over the wire under fluoroscopic guidance up  to the ascending aorta.  Wire was pulled out and the catheter was aspirated  and connected to the manifold. Catheter was further advanced and engaged  into left coronary ostium.  Multiple views of the left system were taken.  Next, the catheter was disengaged and was  pulled out over the wire and was  replaced with 6-French right Judkins catheter which was advanced over the  wire under fluoroscopic guidance up to the ascending aorta.  Wire was pulled  out, the catheter was aspirated and connected to the manifold.  Catheter was  further advanced and engaged into right coronary ostium.  Multiple views of  the right system were taken.  Next, the catheter was disengaged and was  pulled out over the wire and was replaced with 6-French pigtail catheter  which was advanced over wire under fluoroscopic guidance up to the ascending  aorta.  Wire was pulled out, the catheter was aspirated and connected to the  manifold.  Catheter was further advanced across the aortic valve into the  left ventricle.  Left ventricular pressures were recorded.  Next, left  ventriculography was done in 30-degree RAO position.  Post angiographic  pressures were recorded from LV and then pullback pressures were recorded  from the aorta.  There was no gradient across the aortic valve.  Next, the  pigtail catheter was pulled out over the wire, sheaths aspirated and  flushed.   FINDINGS:  The LV showed mild global hypokinesia, EF of 50% approximately.  The left main was patent.  LAD was patent.  Diagonal #1 and 2 were very  small which were patent.  Ramus was medium size which was patent.  Left  circumflex was patent.  OM-1 was less than 0.5 mm which was very, very small  vessel which was diffusely diseased.  OM-2 was moderate and patent.  RCA was  patent.  The patient tolerated the procedure well and no complications.  The  patient was transferred to recovery room in stable condition.                                               Eduardo Osier. Sharyn Lull, M.D.    MNH/MEDQ  D:  07/01/2003  T:  07/01/2003  Job:  [id number]

## 2011-04-27 NOTE — Assessment & Plan Note (Signed)
[facility]                              CARDIOLOGY OFFICE NOTE   [name], [name]                     MRN:          [id number]  DATE:10/18/2006                            DOB:          [birth year]    REASON FOR VISIT:  Follow up cardiac testing.   HISTORY OF PRESENT ILLNESS:  I saw [name] back on the 19th of October.  Her history is detailed in my previous note.  I referred her for followup  ischemic testing, and she underwent an exercise echocardiogram.  She  experienced some chest pain with the study, although had no diagnostic  electrocardiographic changes and had no arrhythmias.  Her echocardiographic  views showed normal left ventricular function at baseline, with no regional  wall motion abnormalities and no inducible wall motion abnormalities with  stress.  This would suggest an overall very low risk of major underlying  ischemic heart disease, and I reassured the patient today.  Her descriptions  of chest pain are fairly atypical, describing intermittent sharp episodes.  I asked her to arrange followup with [name], as she may need to  consider other etiologies, potentially pulmonary or gastrointestinal.  At  this point would not pursue any additional ischemic evaluation based on her  reassuring exercise echocardiogram.   ALLERGIES:  No known drug allergies.   PRESENT MEDICATIONS:  1. Multivitamins.  2. Proventil.  3. Celexa 20 mg p.o. daily.  4. Metoclopramide 10 mg p.o. q.i.d.  5. Lexapro 10 mg p.o. daily.  6. Levothyroxine 50 mcg p.o. daily.  7. Ibuprofen p.r.n.  8. Promethazine p.r.n.   REVIEW OF SYSTEMS:  As described in history of present illness.   PHYSICAL EXAMINATION:  Blood pressure today 113/66, heart rate is 76, weight  136 pounds.  She in no acute distress.  NECK:  Examination shows no elevated jugular venous pressure or loud bruits,  no thyromegaly is noted.  LUNGS:  Clear without labored  breathing.  CARDIAC:  Exam reveals a regular rate and rhythm without rub, murmur or  gallop.  EXTREMITIES:  Show no pitting edema.   IMPRESSION AND RECOMMENDATIONS:  1. Longstanding, intermittent, atypical chest discomfort as well as      dyspnea.  Records indicate prior evidence of presumably a non-ST      elevation myocardial infarction in 2004 based on abnormal troponin I      levels in the setting of severe systemic illness, including amnionic      fluid embolism with ventilatory-dependent respiratory failure,      disseminating intravascular coagulation and sepsis.  Prior cardiac      catheterization at that time by Dr. Sharyn Lull did not reveal any major      obstructive disease in the major epicardial vessels, and at this point      followup exercise echocardiogram is within normal limits.  It seems      unlikely that the patient's symptoms are purely cardiac, and I tried to      reassure her today about this.  It may be that she needs additional      evaluation,  perhaps from a pulmonary or gastroenterology evaluation,      and I have recommended that she maintain followup with [name], and      proceed from there.  At this particular time would not pursue      additional ischemic evaluation, based on her reassuring exercise      echocardiogram.  2. We can see her back as needed.     Jonelle Sidle, MD  Electronically Signed    SGM/MedQ  DD: 10/18/2006  DT: 10/19/2006  Job #: [id number]   cc:   [name]

## 2011-04-27 NOTE — Group Therapy Note (Signed)
NAMESETAREH, ROM NO.:  [id number]   MEDICAL RECORD NO.:  [id number]          PATIENT TYPE:  WOC   LOCATION:  [facility]                   FACILITY:  [facility]   PHYSICIAN:  Tinnie Gens, MD        DATE OF BIRTH:  05/28/1971   DATE OF SERVICE:                                    CLINIC NOTE   CHIEF COMPLAINT:  Birth control follow-up.   HISTORY OF PRESENT ILLNESS:  Patient is a 40 year old gravida 4, para 4, who  has previously had an amniotic fluid embolism with her last pregnancy and  status post massive transfusion, who is doing well.  She actually brings her  child in today, who also is doing well.  She had no significant complaints  today.  She is on her cycle though.  Her last Pap smear was in 2005 and was  normal, and she had a normal one before that and no change in partners.   PHYSICAL EXAMINATION:  VITAL SIGNS:  Blood pressure is 112/71, weight 137,  temp 98.4.  GENERAL: She is a well-developed, well-nourished female in no acute  distress.  ABDOMEN:  Soft, non-tender, and non-distended.  GENITOURINARY:  Deferred secondary to bleeding.   IMPRESSION:  OTC oral contraceptive surveillance.   PLAN:  I have refilled her Ortho Tri-Cyclen Lo for a year.  She will follow  up in one year in December of 2007 for her next Pap smear.           ______________________________  Tinnie Gens, MD     TP/MEDQ  D:  11/22/2005  T:  11/22/2005  Job:  [id number]

## 2011-04-27 NOTE — Op Note (Signed)
NAMEZENOBIA, KUENNEN                      ACCOUNT NO.:  [id number]   MEDICAL RECORD NO.:  [id number]                   PATIENT TYPE:  AMB   LOCATION:  [facility]                                  FACILITY:  [facility]   PHYSICIAN:  Lubertha Basque. Jerl Santos, M.D.             DATE OF BIRTH:  24-Sep-1971   DATE OF PROCEDURE:  04/20/2003  DATE OF DISCHARGE:                                 OPERATIVE REPORT   PREOPERATIVE DIAGNOSES:  1. Left knee anterior cruciate ligament tear.  2. Left knee torn medial meniscus.   POSTOPERATIVE DIAGNOSES:  1. Left knee anterior cruciate ligament tear.  2. Left knee chondromalacia of patella.   OPERATION:  1. Left knee anterior cruciate ligament reconstruction.  2. Left knee chondroplasty of patella.   SURGEON:  Lubertha Basque. Jerl Santos, M.D.   ASSISTANT:  Lindwood Qua, P.A.   ANESTHESIA:  General   INDICATIONS FOR PROCEDURE:  The patient is a 40 year old woman with a long  history of an unstable left knee.  This has persisted despite activity  restriction and an exercise program.  She has been left with a knee which  limits her activities and gives way frequency.  She has undergone an MRI  scan which shows a complete anterior cruciate ligament insufficiency.  She  is offered a reconstruction of her anterior cruciate ligament at this point  in hopes of stabilizing the knee.  Informed operative consent was obtained  after discussion of possible complications of, reaction to anesthesia and  infection.  We discussed various graft options and together decided upon an  Allograft in hopes of returning her family and work in a more timely  fashion.  The extensive postoperative rehabilitation after this operation  was also discussed in detail.   DESCRIPTION OF PROCEDURE:  With the patient in the operating suite where  general anesthetic was applied without.  She was positioned supine and  prepped and draped in the normal fashion.  After the administration of  preoperative antibiotics, an arthroscopy of the left knee was performed  through a total of two inferior portals.  Suprapatellar pouch was benign,  while the patella femoral joint exhibited some mild chondromalacia, brief  chondroplasty.  Medial and lateral compartments exhibits no evidence of left  lower articular cartilage injury, though there appeared to be a healed  injury in the lateral meniscus consistent with an old bucket handle tear  which had healed well.  I saw no degenerative changes.  The anterior  cruciate ligament was for the most part absent but there was a small remnant  adherent to the PCL which was intact.  The remnant anterior cruciate  ligament was removed, followed by a notchplasty done with an arthroscopic  bur.  An Allograft was fully defrosted on the back table and contoured to  fit through 9 and 10 mm tunnels by Bryna Colander.  Drill holes were placed  in each of the bone plugs, with  PDS suture placed in one and a wire in the  other.  A guide was then placed inside the knee just anterior to the PCL and  utilized to place a guide wire through a separate anterior stab wound into  the knee.  This was over reamed to a diameter of 10 mm creating a tibial  tunnel.  A guide was then placed through this in the over the top position  of the femur.  Utilized to place the guide wire through the tibial tunnel,  through this guide, and into the femur in the appropriate position.  This  was utilized to ream the distal femur to a diameter of 9 mm and a depth of 3  cm.  Care was taken to maintain a 1 or 2 mm posterior wall which was well  visualized arthroscopically.  Bony debride was removed from the knee with a  resector.  The aforementioned fully defrosted tension graft was then pulled  through the tibial tunnel into the femoral tunnel.  Care was taken to keep  the tendinous surface of the graft in a posterior position.  The graft went  in appropriately and was secured in the  femoral tunnel with an 8 x 25 metal  Lensotex screw placed over an anterior guide wire in this tunnel.  Attention  was then placed on the trailing bone plug with the attached wire and the  leading bone plug could not be dislodged.  The knee moved well and the graft  was felt to be placed in isometric position.  A guide wire was then placed  through the tibial tunnel and seemed to enter the knee.  The trailing bone  plug was then secured in the tibia with an identical interference screw over  this guide wire.  Again the knee ranged fully and came to full  hyperextension without significant impingement.  The graft was thought to be  taut throughout.  The knee was irrigated, followed by placement of Marcaine  with epinephrine and Morphine.  A stitch of Nylon was placed in the tibial  stab wound, followed by Adaptic on all her portals with a dry gauze dressing  and a loose Ace wrap.  Estimated blood loss and operative fluids can be  obtained from anesthesia records.  No tourniquet was placed or used.   DISPOSITION:  The patient was extubated in the operating room and taken to  the recovery room in stable condition.  Plans were for her to stay overnight  for pain control with probable discharge in the morning.                                               Lubertha Basque Jerl Santos, M.D.    PGD/MEDQ  D:  04/20/2003  T:  04/21/2003  Job:  [id number]

## 2011-04-27 NOTE — Discharge Summary (Signed)
NAME:  [name], [name]                      ACCOUNT NO.:  [id number]   MEDICAL RECORD NO.:  [id number]                   PATIENT TYPE:  INP   LOCATION:  9130                                 [facility]ACILITY:  [facility]   PHYSICIAN:  Tanya S. Shawnie Pons, M.D.                DATE OF BIRTH:  Apr 13, 1971   DATE OF ADMISSION:  02/15/2003  DATE OF DISCHARGE:  02/23/2003                                 DISCHARGE SUMMARY   DISCHARGE MEDICATIONS:  1. Ibuprofen 600 mg q.6h. p.r.n.  2. Percocet 5/325 one to two q.4-6h. p.r.n.  3. Zoloft 100 mg one pill daily.   DISCHARGE INSTRUCTIONS:  Activities to include light activity and no dietary  or wound care instructions.   FOLLOW-UP:  The patient is to follow up at 1:15 p.m. on Thursday, February 25, 2003 at OB/GYN clinic at [facility] for hospital follow-up visit.   BRIEF ADMISSION HISTORY:  This is a 40 year old G6 P4-0-2-4 who is  postpartum day #11 status post spontaneous vaginal delivery complicated by  amniotic fluid embolism.  The patient had profound DIC with massive  hemorrhage and multiple transfusions, respiratory failure which resolved,  and who now presents on the day of admission with severe pelvic pain and  diarrhea and some dysuria.  The patient is very tearful and sad and  concerned.  The patient is afebrile at the time but was recently discharged  approximately only a day ago.   ADMISSION MEDICATIONS:  Prenatal vitamins, Phenergan, Augmentin.   ALLERGIES:  None.   OBSTETRICAL HISTORY:  Spontaneous vaginal delivery x4.   GYNECOLOGICAL HISTORY:  1. History of chlamydia.  2. Spontaneous abortion in 1990.  3. Total abortion in 1998.   SOCIAL HISTORY:  The patient denies any substance abuse and is married.   ADMISSION PHYSICAL EXAMINATION:  VITAL SIGNS:  Afebrile, 98.5, pulse 79,  respirations 18, blood pressure 149/75.  GENERAL:  She is a thin female in no acute distress although anxious and  tearful.  HEENT:  Normocephalic,  atraumatic, positive hemorrhage in the right eye.  LUNGS:  Clear to auscultation bilaterally.  ABDOMEN:  Soft, positive bowel sounds.  HEART:  Regular rate and rhythm.  No murmurs, rubs, or gallops.  PELVIC:  External genitalia reveal some mild erythema.  The uterus is  exquisitely tender.  RECTUM:  Has decreased tone and feels intact.  SKIN:  No rashes.  EXTREMITIES:  No cyanosis, clubbing, or edema in the lower extremities.   LABORATORY DATA:  White count 15.7, hemoglobin 10.8, platelets 504.  AST/ALT  are 38 and 20 respectively, total bili 1.2.  Catheterized UA reveals  moderate hemoglobin, trace leukocyte esterase, negative nitrites, 7-10  wbc's, and 3-6 rbc's.   ADMISSION DIAGNOSES:  1. Postpartum endometritis.  2. Possible urinary tract infection.  3. Depression.  4. Diarrhea probably related to Augmentin therapy.  5. 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   HOSPITAL COURSE:  The patient was started  on IV fluids, IV antibiotics  including triple therapy of ampicillin, gentamycin, and clindamycin.  Urine  cultures were sent.  The patient was started on Imodium and an  antidepressant.  The patient was maintained on triple therapy antibiotics  and had fever curve continuing to trend up throughout hospital course  despite adequate antibiotic treatment.  The patient continued to complain of  moderate to severe abdominal pain despite adequate pain control.  The  patient also complained of some shortness of breath with deep breaths and  some upper rib pain described as 10/10.  Although O2 saturations were good  with 96-97% the patient found it difficult to get good oxygenation.  She was  not tachypneic.  Respiration rate was approximately 20.  The patient had  chest x-rays PA and lateral that showed decreased bilateral pleural effusion  with persistent bibasilar atelectasis; otherwise, no infiltrate.  On  hospital day #4 despite adequate antibiotic therapy and increasing fever  curve,  the patient had CT scan performed to rule out septic thrombophlebitis  and ovarian thromboembolism.  CT was performed at [facility] which showed  small bilateral pleural effusions, bibasilar atelectasis, and it also showed  right ovarian vein thrombosis, may be infectious thrombophlebitis left  ovarian vein.  They also had some thrombus.  Possible retained clot in the  left ovarian vein, enlarged uterus with a small amount of air and some fluid  in the endometrium, cannot exclude endometritis.  The patient at that point  in time was switched over to heparin drip for treatment of ovarian  thromboembolism and switched over to Primaxin for greater coverage.  The  patient's white count started to trend down subsequently.  White count at  time of discharge was 8.3.  The patient's fever curve trended down very  nicely and was afebrile x72 hours at the time of discharge.  The patient's  abdominal tenderness decreased to very mild abdominal discomfort upon  palpation the day of discharge.  The patient was discontinued off of  Primaxin and heparin drip on day of discharge and the patient received a  total of six days of antibiotic therapy and the patient will follow up in  GYN clinic on February 25, 2003 at 1:15 p.m. for follow-up of both ovarian vein  thrombosis and endometritis.  Literature on the chart supports  discontinuation of IV anticoagulation and no oral anticoagulation on  discharge for such a diagnosis.   The patient was also started on Zoloft 100 mg p.o. daily for depression.  The patient's mood was stable at the time of discharge and will continue  Zoloft at discharge.   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  Sodium remained  normal 135 to 139 range, potassium remained in normal range from 3.5 to 4.0. Prolactin level was also obtained which was 28.8.  Serum estradiol was also  obtained which  was 47.8 and FSH was 4.3 and TSH was 1.544.  LH was 0.6.  Urine osmolarities were 294.  The patient was thus ruled out by laboratory  values for Sheehan's.  No imaging studies were done of the pituitary.  Will  continue to watch and observe, though.   For the patient's diarrhea, the patient had negative C. difficile cultures  and resolved on approximately hospital day #2 with no further therapy.  The  patient was watched and had normal bowel movements despite getting IV  antibiotics throughout hospital course.  The  patient was discharged on February 23, 2039 in stable and improved condition and will follow up on February 25, 2003 as noted above.  The case was discussed with Dr. Tinnie Gens and okay  for discharge.   The patient's vital signs on day of discharge showed afebrile, blood  pressure 100/60, pulse 80, and respirations 20.   LABORATORY DATA:  Patient's labs at day of discharge revealed white count  8.3, hemoglobin 10.3, hematocrit 30.8, platelets 804.     Alvira Philips, M.D.                      Shelbie Proctor. Shawnie Pons, M.D.    RM/MEDQ  D:  02/23/2003  T:  02/23/2003  Job:  [id number]

## 2011-04-27 NOTE — Assessment & Plan Note (Signed)
[facility]                              CARDIOLOGY OFFICE NOTE   [name], [name]                     MRN:          [id number]  DATE:09/27/2006                            DOB:          11-02-1971    PRIMARY CARE Newell Wafer:  [name] with [facility].   REASON FOR VISIT:  Patient self-referral with history of chest pain.   HISTORY OF PRESENT ILLNESS:  [name] is a 40 year old woman with  available history indicating amniotic fluid embolism complicating vaginal  delivery back in March 2004.  She required critical care with ventilatory  support and experienced respiratory failure, hypotension, DIC, and sepsis  during her hospital stay at that time.  She was also noted to rule in for a  myocardial infarction at that point (I presume a non ST elevation  myocardial infarction based on limited information) and had a peaked  troponin level of 14.  The discharge summary states that she was seen by  cardiology at that time and the records available at this point indicate  that Dr. Sharyn Lull reviewed a myocardial perfusion study done on the patient  in July of 2004.  The report of this study indicated a reversible perfusion  defect in the mid portion of the anterior wall, concerning for ischemia with  an ejection fraction of 52%.  She ultimately underwent a cardiac  catheterization by Dr. Sharyn Lull which reported mild global left ventricular  hypokinesis with an ejection fraction of 50%.  The left main was patent,  left anterior descending patent, first and second diagonals were small but  patent, ramus intermedius was patent, left circumflex was patent.  First  obtuse marginal was very small, less than 0.5 mm and diffusely diseased.  Second obtuse marginal was moderate and patent.  Right coronary artery was  also patent.  [name] states that she saw Dr. Sharyn Lull in the office  following her procedure and had not had any regular cardiology  followup  since that time.   Symptomatically, [name] tells me that she has experienced  intermittent sharp episodes of chest pain and a feeling of sudden  breathlessness that occur sporadically and have actually done so for at  least the last few years.  She denies having specific exertion induced chest  pain.  She does describe some sense of palpitations when she gets upset  but has had no frank syncope.  Her electrocardiogram today is normal showing  sinus rhythm at 70 beats per minute.   ALLERGIES:  No known drug allergies.   PRESENT MEDICATIONS:  1. Multivitamins.  2. Proventil.  3. Celexa 20 mg p.o. daily.  4. Metoclopramide 10 mg p.o. q.i.d.  5. Lexapro 10 mg p.o. daily.  6. Levothyroxine 50 mcg p.o. daily.  7. Ibuprofen p.r.n.  8. Promethazine p.r.n.   PAST MEDICAL HISTORY:  As outlined in the history of present illness.  She  also reports a prior knee surgery in 2004.   FAMILY HISTORY:  Noncontributory for sudden cardiac death or premature  cardiovascular disease.   SOCIAL HISTORY:  The patient is married.  She has  four children.  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  She has a  prior tobacco use history but states that she quit in 1995.  She denies any  recreational drug use and states that she drinks alcoholic beverages rarely.   REVIEW OF SYSTEMS:  As described in the history of present illness.  She  also describes occasional headaches, reflux symptoms, menstrual and sexual  dysfunction, anxiety and depression, fatigue, constipation.   PHYSICAL EXAMINATION:  VITAL SIGNS:  Blood pressure 124/79, heart rate 70,  weight 136 pounds.  GENERAL:  The patient is comfortable and in no acute distress.  HEENT:  Conjunctivae and lids are normal.  Pharynx is clear.  NECK:  Supple without elevated jugular venous pressure or loud bruits.  No  thyromegaly is noted.  LUNGS:  Clear without labored breathing at rest.  No active wheezing.   CARDIAC:  Regular rate and rhythm without S3 gallop or pericardial rub.  ABDOMEN:  Soft with no bruits.  Bowel sounds are present.  EXTREMITIES:  No pitting edema.  Distal pulses are 1-2+.  SKIN:  No ulcerative changes are noted.  MUSCULOSKELETAL:  No kyphosis is noted.  NEUROPSYCHIATRIC:  The patient is alert and oriented x3.  Affect is grossly  normal.   IMPRESSION/RECOMMENDATIONS:  1. Description of chronic intermittent atypical chest pain as outlined      above.  Records indicate evidence of a myocardial infarction in 2004 in      the setting of severe systemic illness including amniotic fluid      embolism with ventilatory dependent respiratory failure, disseminated      intravascular coagulation and sepsis.  Subsequent cardiac evaluation by      Dr. Sharyn Lull including a cardiac catheterization did not demonstrate any      major obstructive disease in the major epicardial vessels with an      ejection fraction of 50%.  She has not had any followup or cardiac      testing based on available information.  Her electrocardiogram is      normal today.  I discussed the situation with the patient and will plan      to proceed with an exercise echocardiogram for basic risk      stratification.  I will then have her come back to the office and we      can review the results.  For the time being I will not make any major      medication adjustments.  2. Further plans to follow.    Jonelle Sidle, MD   SGM/MedQ  DD: 09/27/2006  DT: 09/30/2006  Job #: [id number]   cc:   [name] with [facility]

## 2011-04-27 NOTE — Group Therapy Note (Signed)
[name], [name] NO.:  [id number]   MEDICAL RECORD NO.:  [id number]          PATIENT TYPE:  WOC   LOCATION:  [facility]                   FACILITY:  [facility]   PHYSICIAN:  Tinnie Gens, MD        DATE OF BIRTH:  20-Jan-1971   DATE OF SERVICE:  11/10/2004                                    CLINIC NOTE   CHIEF COMPLAINT:  Pap smear, birth control consult.   HISTORY OF PRESENT ILLNESS:  The patient is a 40 year old gravida 4 para 4-0-  0-4 who previously had a Mirena IUD inserted in May 2004 who is here today  for possible removal of her IUD.  She reports that she has multiple  breakouts on her back and arms and face and that she would like to have this  removed.  She is interested in getting back on oral contraceptives.  She was  recently separated from her husband; however, had intercourse with him  approximately 1 week ago and since that time has had a foul-smelling vaginal  discharge.  The patient does have a history significant for amniotic fluid  embolism in 2004 with the birth of her last child.  The patient's last Pap  was in 2004.  Her last period was October 24, 2004.   PAST MEDICAL HISTORY:  Negative.   PAST SURGICAL HISTORY:  Also negative.   MEDICATIONS:  None.   ALLERGIES:  None known.   SOCIAL HISTORY:  No tobacco, alcohol, or drug use.   REVIEW OF SYSTEMS:  A 14-point review of systems reviewed.  The patient does  have some anxiety and depression although she does not seem too willing to  talk about it today.  Otherwise, her review of systems is negative except as  in HPI.   PHYSICAL EXAMINATION TODAY:  VITAL SIGNS:  As noted in the chart.  Blood  pressure is 96/57.  GENERAL:  She is a thin black female in no acute distress.  ABDOMEN:  Soft, nontender, nondistended.  NECK:  Supple with normal thyroid.  BREASTS:  Symmetric with everted nipples.  There is no mass noted.  There is  no supraclavicular or axillary adenopathy.  GENITOURINARY:   She has normal external female genitalia.  The vagina is  rugated and pink.  There is a foul-smelling discharge that is yellow in  nature noted.  The cervix is without lesion.  The IUD strings are clearly  visible.  The uterus was small, anteverted.  The adnexa are without mass or  tenderness.   PROCEDURE:  IUD strings grabbed with the ring forceps and IUD easily  removed.   IMPRESSION:  1.  Yearly examination.  2.  Birth control consult with intrauterine device removal.  3.  Vaginal discharge.   PLAN:  1.  Pap smear today.  2.  Start Nordette one p.o. daily with next menses.  3.  Encouraged condom use.  4.  Wet prep, GC, chlamydia today.      TP/MEDQ  D:  11/10/2004  T:  11/10/2004  Job:  [id number]

## 2011-04-27 NOTE — Discharge Summary (Signed)
NAMEJAVIANA, [name]                        ACCOUNT NO.:  [id number]   MEDICAL RECORD NO.:  [id number]                   PATIENT TYPE:   LOCATION:                                       FACILITY:   PHYSICIAN:  Julianne Handler. Shattuck, N.P.              DATE OF BIRTH:   DATE OF ADMISSION:  DATE OF DISCHARGE:                                 DISCHARGE SUMMARY   DISCHARGE DIAGNOSES:  1. Forceps assisted vaginal delivery.  2. Amniotic fluid embolism.  3. DIC.  4. Acute myocardial infarction.  5. Hypokalemia.  6. Pulmonary edema.  7. Ventilatory dependent respiratory failure.  8. Hypotension.   DISCHARGE MEDICATIONS:  1. Prenatal vitamins times one month.  2. Augmentin 875 mg one by mouth twice a day times two days.  3. Phenergan 12.5 to 25 mg by mouth every four to six hours as needed.  4. Ortho-Tri-Cyclen one tab by mouth each day, do not start for two weeks.   CONSULTATIONS:  1. Critical Care Medicine.  2. Cardiology.   HISTORY OF PRESENT ILLNESS:  Please see the admission history and physical  for further details. Briefly, the patient is a 40 year old female who was  admitted at 11 and 3/7th weeks with oligohydramnios for labor. Her labor  progressed relatively normal until around the time of delivery, when the  patient's mental status acutely changed. She was found to have an amniotic  fluid embolus. The baby was extracted as quickly as possible. The patient  was intubated and resuscitated. On resuscitation, she was sent to [facility] for further care. She developed DIC and severe anaphylaxis  secondary to her amniotic fluid embolism and received multiple units of FFP,  blood, chronic precipitate, and factor 7-A. She was intubated for a couple  of days until her status improved. She did have a myocardial infarction  secondary to microemboli or severe sepsis. After overcoming the initial  vent, the patient responded slowly. Her mental status remained appropriate  and  she had no evidence of CNS involvement. She was slowly resuscitated at  [facility] and then transferred back to the [facility] on  February 10, 2003, where her rehabilitation continued. She had some mild  pulmonary embolism secondary to fluids and sepsis which has resolved with  Lasix. She has been on room air for approximately 48 hours without any  hypoxia. She had one temperature yesterday morning of 100.6 but has been  afebrile for the past 30-36 hours. On today, she denies any specific  complaints. Again, this is just a summary of her hospital course. Please see  the ICU noted for further details of ICU hospitalization. On the day of  discharge, the patient was having some mild nausea and was treated with  Phenergan. We will continue to treat her with this when she goes home. She  is not breast feeding. Because she had a myocardial infarction, she will  need  a cardiology consultation as an outpatient and we will arrange this.   LABORATORY DATA:  At discharge CBC revealed WBC count 14,000. Hemoglobin and  hematocrit 9.4 and 27. Platelet count 308,000 with a normal differential to  the white count. Chemistry panel revealed sodium of 138, potassium 3.3,  chloride 108, CO2 25, glucose 76, BUN 14, creatinine 0.9, calcium 8.5,  magnesium 2.1. UA is grossly within normal limits. Blood cultures negative  times two days. Urine cultures show no growth. Other lab data as per her  chart.   DIAGNOSTIC IMPRESSION:  Chest x-ray shows a small pleural effusion with  possible atelectasis. An echocardiogram was obtained which showed normal  ejection fraction without evidence of wall motion abnormalities.   DISCHARGE INSTRUCTIONS:  1. Follow-up with [facility] in one week for hospital follow-up.  2. Follow-up with Cardiology. The patient will schedule this. We will given     her the phone number as this cannot be scheduled over the weekend.  3. Medications as directed.  4. Activity as  tolerated.  5. Return to Maternity Admissions Unit or [facility] for fever,     worsening pain, significant discharge, inability to hold down fluids, or     any other concerns.  6. Ortho-Tri-Cyclen for birth control.  7. Regular diet.                                               [name], N.P.    LRS/MEDQ  D:  02/13/2003  T:  02/14/2003  Job:  [id number]   cc:   Shelbie Proctor. Shawnie Pons, M.D.  [address]  [place], [place] [address]  Fax: [phone]

## 2011-04-27 NOTE — Consult Note (Signed)
[name], GORUM                      ACCOUNT NO.:  [id number]   MEDICAL RECORD NO.:  [id number]                   PATIENT TYPE:  INP   LOCATION:  9178                                 [facility]ILITY:  [facility]   PHYSICIAN:  Merilynn Finland, M.D.                DATE OF BIRTH:  1971/07/21   DATE OF CONSULTATION:  02/05/2003  DATE OF DISCHARGE:                                   CONSULTATION   REASON FOR CONSULTATION:  The patient is a 40 year old female who presented  February 05, 2003 in the early morning hours lethargic with fetal heart rate  showing prolonged variable decelerations x3, carrying a pregnancy at 40+  weeks with oligohydramnios.  The patient began to progressively decline over  the course of the next two to three hours and because of both maternal and  fetal compromise the patient was taken for delivery and underwent forceps  delivery at 0400.  The patient was thereafter intubated and it was realized  that her coagulations were out of range with findings consistent with DIC.  At the time of her intubation and transfer to the AICU PT was 47.3, PTT was  greater than 200, INR 7.6, fibrinogen was reportedly low at 47 and her D-  dimers were greater than 20.  ACT was likewise low.  She was rapidly  resuscitated and has received a numerous amount of blood products including  FFP, cyroprecipitate, and packed red blood cells.   PAST MEDICAL HISTORY:  Significant for the above-mentioned pregnancy.  She  has a spontaneous vaginal delivery x3 in 1989, 1991, and 1993.  Her medical  history is consistent only with vertigo.  There is no family history or  social history that is remarkable.  There is no history of substance abuse.   MEDICATIONS:  The only medications have been prenatal vitamins.   REVIEW OF SYSTEMS:  She is currently unresponsive and intubated.   PHYSICAL EXAMINATION:  VITAL SIGNS:  She is afebrile, heart rate is 150, and  her blood pressure is 100/50.  GENERAL:  She  has _____  hemorrhage from her mouth and vagina.  CHEST:  Clear  HEART:  Tachycardic, regular rhythm.  ABDOMEN:  Soft, nontender, slightly distended.  EXTREMITIES:  Show 1+ edema.  She does have some early signs of ischemia  with cool toes and fingers.  No overt petechia or purpura.   IMPRESSION:  A 40 year old female with a history of oligohydramnios, now  status post delivery at 40 weeks with what appears to be postpartum  disseminated intravascular coagulation.  I have reviewed the smear and there  are no signs of any schistocytes or signs of hemolysis, no fragmented cells.  She has a low number of platelets with neutrophils in the order of 80%.   RECOMMENDATIONS:  Recommendations at this point would be for IV heparin at a  low dose of 200 units/hour, to confirm if possible no retained  products, and  keep parameters as follows:  Keep fibrinogen greater than 100, ACT greater  than 80, and I would check coagulations, ACT, and fibrinogen levels q.4-6h.  following D-dimers to determine heparin dose.  Continue broad-spectrum  antibiotics and agree at this point the prognosis is quite grave and I will  continue to follow closely with you.                                              Merilynn Finland, M.D.   JSS/MEDQ  D:  02/05/2003  T:  02/05/2003  Job:  [id number]

## 2011-09-14 LAB — CBC
HCT: 25.9 — ABNORMAL LOW
Platelets: 372
RBC: 2.39 — ABNORMAL LOW
RBC: 2.74 — ABNORMAL LOW
WBC: 14.6 — ABNORMAL HIGH
WBC: 7.7

## 2011-09-14 LAB — APTT: aPTT: 32

## 2011-09-14 LAB — CROSSMATCH
ABO/RH(D): O POS
Antibody Screen: NEGATIVE

## 2011-09-14 LAB — PROTIME-INR
INR: 1.1
Prothrombin Time: 14.1

## 2011-09-17 LAB — TYPE AND SCREEN
ABO/RH(D): O POS
Antibody Screen: NEGATIVE

## 2011-09-17 LAB — CBC
HCT: 33.1 — ABNORMAL LOW
Hemoglobin: 11.2 — ABNORMAL LOW
Hemoglobin: 8.6 — ABNORMAL LOW
RBC: 2.56 — ABNORMAL LOW
RBC: 3.37 — ABNORMAL LOW
WBC: 14.6 — ABNORMAL HIGH

## 2011-09-17 LAB — POCT URINALYSIS DIP (DEVICE)
Bilirubin Urine: NEGATIVE
Ketones, ur: NEGATIVE
Operator id: 194561
Specific Gravity, Urine: 1.02
pH: 7

## 2011-09-17 LAB — CCBB MATERNAL DONOR DRAW

## 2011-09-17 LAB — ABO/RH: ABO/RH(D): O POS

## 2011-09-18 LAB — POCT URINALYSIS DIP (DEVICE)
Bilirubin Urine: NEGATIVE
Bilirubin Urine: NEGATIVE
Glucose, UA: NEGATIVE
Glucose, UA: NEGATIVE
Glucose, UA: NEGATIVE
Ketones, ur: NEGATIVE
Ketones, ur: NEGATIVE
Ketones, ur: NEGATIVE
Operator id: 120861
Operator id: 194561
Operator id: 159681
Operator id: 297281
Protein, ur: 30 — AB
Protein, ur: 30 — AB
Protein, ur: NEGATIVE
Urobilinogen, UA: 0.2

## 2011-09-19 LAB — POCT URINALYSIS DIP (DEVICE)
Operator id: 194561
Protein, ur: 30 — AB
Urobilinogen, UA: 0.2

## 2011-09-20 LAB — POCT URINALYSIS DIP (DEVICE)
Nitrite: NEGATIVE
Operator id: 194561
Protein, ur: NEGATIVE
Urobilinogen, UA: 0.2

## 2011-09-20 LAB — STREP B DNA PROBE

## 2011-09-20 LAB — URINALYSIS, ROUTINE W REFLEX MICROSCOPIC
Ketones, ur: NEGATIVE
Nitrite: NEGATIVE
Protein, ur: NEGATIVE

## 2011-09-20 LAB — WET PREP, GENITAL: Clue Cells Wet Prep HPF POC: NONE SEEN

## 2011-09-21 LAB — POCT URINALYSIS DIP (DEVICE)
Glucose, UA: NEGATIVE
Hgb urine dipstick: NEGATIVE
Ketones, ur: NEGATIVE
Specific Gravity, Urine: 1.02
Urobilinogen, UA: 0.2

## 2011-09-24 LAB — POCT URINALYSIS DIP (DEVICE)
Bilirubin Urine: NEGATIVE
Glucose, UA: NEGATIVE
Glucose, UA: NEGATIVE
Hgb urine dipstick: NEGATIVE
Hgb urine dipstick: NEGATIVE
Ketones, ur: NEGATIVE
Ketones, ur: NEGATIVE
Specific Gravity, Urine: 1.02
Specific Gravity, Urine: 1.02
Urobilinogen, UA: 0.2
Urobilinogen, UA: 0.2

## 2012-02-07 ENCOUNTER — Other Ambulatory Visit (HOSPITAL_COMMUNITY): Payer: Self-pay | Admitting: Family Medicine

## 2012-02-07 DIAGNOSIS — R0989 Other specified symptoms and signs involving the circulatory and respiratory systems: Secondary | ICD-10-CM

## 2012-02-08 ENCOUNTER — Ambulatory Visit (HOSPITAL_COMMUNITY)
Admission: RE | Admit: 2012-02-08 | Discharge: 2012-02-08 | Disposition: A | Discharge status: Home / Self Care | Payer: Self-pay | Source: Ambulatory Visit | Attending: Family Medicine | Admitting: Family Medicine

## 2012-02-08 DIAGNOSIS — R0989 Other specified symptoms and signs involving the circulatory and respiratory systems: Secondary | ICD-10-CM

## 2012-02-08 DIAGNOSIS — K7689 Other specified diseases of liver: Secondary | ICD-10-CM | POA: Insufficient documentation

## 2012-02-08 DIAGNOSIS — R109 Unspecified abdominal pain: Secondary | ICD-10-CM | POA: Insufficient documentation

## 2012-02-08 MED ORDER — IOHEXOL 300 MG/ML  SOLN
80.0000 mL | Freq: Once | INTRAMUSCULAR | Status: AC | PRN
  Administered 2012-02-08: 80 mL via INTRAVENOUS

## 2012-02-19 ENCOUNTER — Encounter: Payer: Self-pay | Admitting: Internal Medicine

## 2012-03-17 ENCOUNTER — Encounter: Payer: Self-pay | Admitting: Internal Medicine

## 2012-03-17 ENCOUNTER — Ambulatory Visit (INDEPENDENT_AMBULATORY_CARE_PROVIDER_SITE_OTHER): Payer: Self-pay | Admitting: Internal Medicine

## 2012-03-17 VITALS — BP 112/74 | HR 72 | Ht 68.0 in | Wt 128.8 lb

## 2012-03-17 DIAGNOSIS — R1012 Left upper quadrant pain: Secondary | ICD-10-CM

## 2012-03-17 NOTE — Progress Notes (Signed)
Subjective:    Patient ID: [name], female    DOB: 1971/12/07, 41 y.o.   MRN: [id number]  HPI This middle-aged [place] American woman presents for evaluation of left upper quadrant pain. She is sent by the [facility]. She reports a fairly constant left upper quadrant pain worse with lying on her side. It's been a chronic problem off and on for years. She underwent an upper endoscopy and a colonoscopy around 2008 and says that there were sores or ulcers in her stomach, but that the colonoscopy was unremarkable. We have requested those records.  She has nausea, she lost her appetite in the last 6-12 months with about 7-8 pound weight loss. There is no clear relationship of the pain to defecation or eating. She has not been vomiting. He feels like she does have some heartburn problems. She does not have any dysphagia or odynophagia. She had H. pylori testing negative, she had a CT of the abdomen and pelvis with contrast last month that demonstrated a small hepatic hemangioma but was otherwise unrevealing.  She reports that several different PPI medications, metoclopramide have not helped her pain at all.  No Known Allergies Outpatient Prescriptions Prior to Visit  Medication Sig Dispense Refill  . clindamycin (CLINDAGEL) 1 % gel Apply 1 application topically daily.      Marland Kitchen doxycycline (PERIOSTAT) 20 MG tablet Take 20 mg by mouth. Take 2 and a half tablets bid                                          Past Medical History  Diagnosis Date  . Gastritis   . Hypothyroidism   . Depression   . Chronic headaches   . Anemia   . Irritable bowel syndrome   . GERD (gastroesophageal reflux disease)   . Hypertension   . Myocardial infarction 2004    DIC, hypertension during delivery  . Amniotic fluid embolism 2004  . Respiratory failure, acute 2004   Past Surgical History  Procedure Date  . Knee arthroscopy   . Dilation and curettage of uterus   . Cardiac catheterization 2004    History   Social History  . Marital Status: Legally Separated    Spouse Name: N/A    Number of Children: 5  . Years of Education: N/A   Occupational History  . Unemployed    Social History Main Topics  . Smoking status: Never Smoker   . Smokeless tobacco: Never Used  . Alcohol Use: No  . Drug Use: No  .                  Family History  Problem Relation Age of Onset  . Prostate cancer Paternal Uncle   . Stomach cancer Paternal Grandmother         Review of Systems Somewhat diffusely positive, notable for headaches, back pain, she's had hematuria, menstrual pain, fatigue and allergy problems. She has significant insomnia. All other abuse systems negative or as intubated the history of present illness.    Objective:   Physical Exam General:  Well-developed, well-nourished and in no acute distress Eyes:  anicteric. ENT:   Mouth and posterior pharynx free of lesions.  Neck:   supple w/o thyromegaly or mass.  Lungs: Clear to auscultation bilaterally. Heart:  S1S2, no rubs, murmurs, gallops. Abdomen:  soft, non-tender, no hepatosplenomegaly, hernia, or mass and BS+.  The ribs and lower chest wall or not tender and there is no CVA    tenderness Lymph:  no cervical or supraclavicular adenopathy. Extremities:   no edema Skin   no rash. Neuro:  A&O x 3.  Psych:  Flat affect   Data Reviewed:  Clinic notes from referring physician and have requested endoscopy and colonoscopy notes from Dr. Elnoria Howard, 2008      Assessment & Plan:   1. Abdominal pain, LUQ (left upper quadrant)    This is a chronic problem. It has a functional flavor to it. I think it is worth repeating an upper endoscopy, she is on chronic doxycycline and that can cause upper GI tract ulceration. She has failed PPI therapy. I think ultimately a tricyclic antidepressant at low dose may provide her overall benefit and she has history of chronic depression and insomnia in this chronic pain.  The risks and  benefits as well as alternatives of endoscopic procedure(s) have been discussed and reviewed. All questions answered. The patient agrees to proceed.  Cc: Dr.Emobong Amao

## 2012-03-17 NOTE — Patient Instructions (Addendum)
You have been scheduled for an endoscopy. Please follow written instructions given to you at your visit today.  

## 2012-03-20 ENCOUNTER — Ambulatory Visit (AMBULATORY_SURGERY_CENTER): Payer: Self-pay | Admitting: Internal Medicine

## 2012-03-20 ENCOUNTER — Encounter: Payer: Self-pay | Admitting: Internal Medicine

## 2012-03-20 VITALS — BP 121/74 | HR 64 | Temp 96.4°F | Resp 16 | Ht 68.0 in | Wt 128.0 lb

## 2012-03-20 DIAGNOSIS — K2960 Other gastritis without bleeding: Secondary | ICD-10-CM

## 2012-03-20 DIAGNOSIS — R1012 Left upper quadrant pain: Secondary | ICD-10-CM

## 2012-03-20 DIAGNOSIS — K259 Gastric ulcer, unspecified as acute or chronic, without hemorrhage or perforation: Secondary | ICD-10-CM

## 2012-03-20 DIAGNOSIS — K319 Disease of stomach and duodenum, unspecified: Secondary | ICD-10-CM

## 2012-03-20 MED ORDER — OMEPRAZOLE 20 MG PO CPDR: 20.0000 mg | DELAYED_RELEASE_CAPSULE | Freq: Every day | ORAL | Status: DC

## 2012-03-20 MED ORDER — AMITRIPTYLINE HCL 25 MG PO TABS: 25.0000 mg | ORAL_TABLET | Freq: Every day | ORAL | Status: DC

## 2012-03-20 MED ORDER — SODIUM CHLORIDE 0.9 % IV SOLN: 500.0000 mL | INTRAVENOUS | Status: DC

## 2012-03-20 NOTE — Patient Instructions (Addendum)
There were two erosions or tiny ulcers in the stomach. I doubt these are causing your chronic pain. I am prescribing omeprazole for the ulcers but also prescribing amitriptyline which will help the abdominal/side pain and headaces as well as sleeping difficulties. It may not help until taken for 1-2 months so give it time. Iva Boop, MD, FACG  YOU HAD AN ENDOSCOPIC PROCEDURE TODAY AT THE [facility]: Refer to the procedure report that was given to you for any specific questions about what was found during the examination.  If the procedure report does not answer your questions, please call your gastroenterologist to clarify.  If you requested that your care partner not be given the details of your procedure findings, then the procedure report has been included in a sealed envelope for you to review at your convenience later.  YOU SHOULD EXPECT: Some feelings of bloating in the abdomen. Passage of more gas than usual.  Walking can help get rid of the air that was put into your GI tract during the procedure and reduce the bloating. If you had a lower endoscopy (such as a colonoscopy or flexible sigmoidoscopy) you may notice spotting of blood in your stool or on the toilet paper. If you underwent a bowel prep for your procedure, then you may not have a normal bowel movement for a few days.  DIET: Your first meal following the procedure should be a light meal and then it is ok to progress to your normal diet.  A half-sandwich or bowl of soup is an example of a good first meal.  Heavy or fried foods are harder to digest and may make you feel nauseous or bloated.  Likewise meals heavy in dairy and vegetables can cause extra gas to form and this can also increase the bloating.  Drink plenty of fluids but you should avoid alcoholic beverages for 24 hours.  ACTIVITY: Your care partner should take you home directly after the procedure.  You should plan to take it easy, moving slowly for the rest  of the day.  You can resume normal activity the day after the procedure however you should NOT DRIVE or use heavy machinery for 24 hours (because of the sedation medicines used during the test).    SYMPTOMS TO REPORT IMMEDIATELY: A gastroenterologist can be reached at any hour.  During normal business hours, 8:30 AM to 5:00 PM Monday through Friday, call [phone].  After hours and on weekends, please call the GI answering service at [phone] who will take a message and have the physician on call contact you.   Following lower endoscopy (colonoscopy or flexible sigmoidoscopy):  Excessive amounts of blood in the stool  Significant tenderness or worsening of abdominal pains  Swelling of the abdomen that is new, acute  Fever of 100F or higher  Following upper endoscopy (EGD)  Vomiting of blood or coffee ground material  New chest pain or pain under the shoulder blades  Painful or persistently difficult swallowing  New shortness of breath  Fever of 100F or higher  Black, tarry-looking stools  FOLLOW UP: If any biopsies were taken you will be contacted by phone or by letter within the next 1-3 weeks.  Call your gastroenterologist if you have not heard about the biopsies in 3 weeks.  Our staff will call the home number listed on your records the next business day following your procedure to check on you and address any questions or concerns that you may have  at that time regarding the information given to you following your procedure. This is a courtesy call and so if there is no answer at the home number and we have not heard from you through the emergency physician on call, we will assume that you have returned to your regular daily activities without incident.  SIGNATURES/CONFIDENTIALITY: You and/or your care partner have signed paperwork which will be entered into your electronic medical record.  These signatures attest to the fact that that the information above on your After  Visit Summary has been reviewed and is understood.  Full responsibility of the confidentiality of this discharge information lies with you and/or your care-partner.

## 2012-03-20 NOTE — Progress Notes (Signed)
Patient did not experience any of the following events: a burn prior to discharge; a fall within the facility; wrong site/side/patient/procedure/implant event; or a hospital transfer or hospital admission upon discharge from the facility. (G8907) Patient did not experience a hospital transfer or hospital admission upon discharge from [facility]. (G8915)  

## 2012-03-20 NOTE — Op Note (Signed)
[facility] [address] [place], [place]  [address]  ENDOSCOPY PROCEDURE REPORT  PATIENT:  [name], [name]  MR#:  [id number] BIRTHDATE:  1971-05-22, 40 yrs. old  GENDER:  female  ENDOSCOPIST:  Iva Boop, MD, [facility] Referred by:   [facility]  PROCEDURE DATE:  03/20/2012 PROCEDURE:  EGD with biopsy, 19147 ASA CLASS:  Class II INDICATIONS:  abdominal pain, left upper quadrant  MEDICATIONS:   These medications were titrated to patient response per physician's verbal order, Fentanyl 50 mcg IV, Versed 3 mg IV, Benadryl 12.5 mg IV TOPICAL ANESTHETIC:  Cetacaine Spray  DESCRIPTION OF PROCEDURE:   After the risks benefits and alternatives of the procedure were thoroughly explained, informed consent was obtained.  [facility] GIF-H180 [id number] endoscope was introduced through the mouth and advanced to the second portion of the duodenum, without limitations.  The instrument was slowly withdrawn as the mucosa was fully examined. <<PROCEDUREIMAGES>>  Multiple erosions were found in the antrum. Two erosions in the pre-pyloric antrum and some patchy erythema. Multiple biopsies were obtained and sent to pathology.  Otherwise the examination was normal.    Retroflexed views revealed no abnormalities.    The scope was then withdrawn from the patient and the procedure completed.  COMPLICATIONS:  None  ENDOSCOPIC IMPRESSION: 1) Erosions, 2 in the antrum - biopsied 2) Otherwise normal examination RECOMMENDATIONS: 1) Omeprazole 20 mg daily 2) Amitriptyline 25 mg at bedtime for chronic LUQ pain in setting of chronic headaches and insomnia 3) Pathology letter to patient also - likely follow-up as needed and return to PCP  Iva Boop, MD, [facility]  CC:  [facility]  n. Rosalie Doctor:   Iva Boop at 03/20/2012 11:29 AM  Natasha Bence, [phone]

## 2012-03-21 ENCOUNTER — Telehealth: Payer: Self-pay | Admitting: *Deleted

## 2012-03-21 NOTE — Telephone Encounter (Signed)
Left message to call office if questions or concerns. 

## 2012-03-26 ENCOUNTER — Encounter: Payer: Self-pay | Admitting: Internal Medicine

## 2012-03-26 NOTE — Progress Notes (Signed)
Quick Note:  Reactive gastropathy  To try omeprazole and amitriptyline for chronic LUQ pain ______

## 2012-12-24 ENCOUNTER — Inpatient Hospital Stay (HOSPITAL_COMMUNITY)
Admission: AD | Admit: 2012-12-24 | Discharge: 2012-12-25 | Disposition: A | Discharge status: Home / Self Care | Payer: Self-pay | Source: Ambulatory Visit | Attending: Obstetrics & Gynecology | Admitting: Obstetrics & Gynecology

## 2012-12-24 ENCOUNTER — Inpatient Hospital Stay (HOSPITAL_COMMUNITY): Payer: Self-pay

## 2012-12-24 ENCOUNTER — Encounter (HOSPITAL_COMMUNITY): Payer: Self-pay | Admitting: *Deleted

## 2012-12-24 DIAGNOSIS — N925 Other specified irregular menstruation: Secondary | ICD-10-CM

## 2012-12-24 DIAGNOSIS — N949 Unspecified condition associated with female genital organs and menstrual cycle: Secondary | ICD-10-CM

## 2012-12-24 DIAGNOSIS — N939 Abnormal uterine and vaginal bleeding, unspecified: Secondary | ICD-10-CM

## 2012-12-24 DIAGNOSIS — N938 Other specified abnormal uterine and vaginal bleeding: Secondary | ICD-10-CM | POA: Insufficient documentation

## 2012-12-24 LAB — CBC
HCT: 34.4 % — ABNORMAL LOW (ref 36.0–46.0)
Hemoglobin: 11.7 g/dL — ABNORMAL LOW (ref 12.0–15.0)
MCH: 31.9 pg (ref 26.0–34.0)
MCHC: 34 g/dL (ref 30.0–36.0)
MCV: 93.7 fL (ref 78.0–100.0)
RDW: 14.3 % (ref 11.5–15.5)

## 2012-12-24 LAB — WET PREP, GENITAL
Clue Cells Wet Prep HPF POC: NONE SEEN
Trich, Wet Prep: NONE SEEN
Yeast Wet Prep HPF POC: NONE SEEN

## 2012-12-24 LAB — SAMPLE TO BLOOD BANK

## 2012-12-24 MED ORDER — MEDROXYPROGESTERONE ACETATE 10 MG PO TABS
10.0000 mg | ORAL_TABLET | Freq: Once | ORAL | Status: AC
  Administered 2012-12-24: 10 mg via ORAL
  Filled 2012-12-24: qty 1

## 2012-12-24 MED ORDER — KETOROLAC TROMETHAMINE 60 MG/2ML IM SOLN
60.0000 mg | Freq: Once | INTRAMUSCULAR | Status: AC
  Administered 2012-12-24: 60 mg via INTRAMUSCULAR
  Filled 2012-12-24: qty 2

## 2012-12-24 NOTE — MAU Note (Signed)
Bleeding started 12/22/2012. Pt states she has had some heavy in the evening hours. Pt states she is using eight tampons daily and is wearing a pad at this time.

## 2012-12-24 NOTE — MAU Note (Signed)
Pt states she is on her period-sttes it started this morning but the bleeding has gotten heavier and heavier-sttes is passing clots as well

## 2012-12-24 NOTE — [facility]U Provider Note (Signed)
History     CSN: [id number]  Arrival date and time: 12/24/12 2058   First Provider Initiated Contact with Patient 12/24/12 2133      Chief Complaint  Patient presents with  . Vaginal Bleeding   HPI [name] is a 42 y.o. female who presents to [facility]U with vaginal bleeding. The bleeding started 12/22/12 like a normal period but today got very heavy and started passing clots. Using 9 tampons today. Last pap smear one year ago and was normal. Last sexually active one month ago. Hx of Chlamydia and trichomonas. BTL for birth control. The history was provided by the patient.  OB History    Grav Para Term Preterm Abortions TAB SAB Ect Mult Living   6 5 5  0 1 0 1 0 0 5      Past Medical History  Diagnosis Date  . Gastritis   . Hypothyroidism   . Depression   . Chronic headaches   . Anemia   . Irritable bowel syndrome   . GERD (gastroesophageal reflux disease)   . Hypertension   . Myocardial infarction 2004    DIC, hypertension during delivery  . Amniotic fluid embolism 2004  . Respiratory failure, acute 2004    Past Surgical History  Procedure Date  . Knee arthroscopy   . Dilation and curettage of uterus   . Cardiac catheterization 2004    Family History  Problem Relation Age of Onset  . Prostate cancer Paternal Uncle   . Stomach cancer Paternal Grandmother     History  Substance Use Topics  . Smoking status: Never Smoker   . Smokeless tobacco: Never Used  . Alcohol Use: No    Allergies: No Known Allergies  Prescriptions prior to admission  Medication Sig Dispense Refill  . amitriptyline (ELAVIL) 25 MG tablet Take 1 tablet (25 mg total) by mouth at bedtime.  30 tablet  5  . Multiple Vitamin (MULTIVITAMIN WITH MINERALS) TABS Take 1 tablet by mouth daily.      . Pantoprazole Sodium (PROTONIX PO) Take 1 capsule by mouth daily.        Review of Systems  Constitutional: Positive for chills. Negative for fever and weight loss.  HENT: Negative for ear pain,  nosebleeds, congestion, sore throat and neck pain.   Eyes: Negative for blurred vision, double vision, photophobia and pain.  Respiratory: Positive for shortness of breath. Negative for cough and wheezing.   Cardiovascular: Negative for chest pain, palpitations and leg swelling.  Gastrointestinal: Positive for nausea and abdominal pain. Negative for heartburn, vomiting, diarrhea and constipation.  Genitourinary: Negative for dysuria, urgency and frequency.  Musculoskeletal: Positive for back pain. Negative for myalgias.  Skin: Negative for itching and rash.  Neurological: Positive for dizziness and headaches. Negative for sensory change, speech change, seizures and weakness.  Endo/Heme/Allergies: Does not bruise/bleed easily (hx of Pe).  Psychiatric/Behavioral: Positive for depression. Negative for substance abuse. The patient is not nervous/anxious and does not have insomnia.    Blood pressure 130/78, pulse 80, temperature 98.3 F (36.8 C), temperature source Oral, resp. rate 16, height 5\' 2"  (1.575 m), weight 124 lb (56.246 kg), last menstrual period 12/24/2012, SpO2 100.00%.  Physical Exam  Nursing note and vitals reviewed. Constitutional: She is oriented to person, place, and time. She appears well-developed and well-nourished. No distress.  HENT:  Head: Normocephalic and atraumatic.  Eyes: EOM are normal.  Neck: Neck supple.  Cardiovascular: Normal rate.   Respiratory: Effort normal.  GI: Soft. There is no  tenderness.  Musculoskeletal: Normal range of motion.  Neurological: She is alert and oriented to person, place, and time.  Skin: Skin is warm and dry.  Psychiatric: She has a normal mood and affect. Her behavior is normal. Judgment and thought content normal.   Procedures  Results for orders placed during the hospital encounter of 12/24/12 (from the past 24 hour(s))  CBC     Status: Abnormal   Collection Time   12/24/12  9:50 PM      Component Value Range   WBC 7.5  4.0 -  10.5 K/uL   RBC 3.67 (*) 3.87 - 5.11 MIL/uL   Hemoglobin 11.7 (*) 12.0 - 15.0 g/dL   HCT 16.1 (*) 09.6 - 04.5 %   MCV 93.7  78.0 - 100.0 fL   MCH 31.9  26.0 - 34.0 pg   MCHC 34.0  30.0 - 36.0 g/dL   RDW 40.9  81.1 - 91.4 %   Platelets 247  150 - 400 K/uL  SAMPLE TO BLOOD BANK     Status: Normal   Collection Time   12/24/12  9:50 PM      Component Value Range   Blood Bank Specimen SAMPLE AVAILABLE FOR TESTING     Sample Expiration 12/27/2012    WET PREP, GENITAL     Status: Normal   Collection Time   12/24/12 10:18 PM      Component Value Range   Yeast Wet Prep HPF POC NONE SEEN  NONE SEEN   Trich, Wet Prep NONE SEEN  NONE SEEN   Clue Cells Wet Prep HPF POC NONE SEEN  NONE SEEN   WBC, Wet Prep HPF POC NONE SEEN  NONE SEEN  POCT PREGNANCY, URINE     Status: Normal   Collection Time   12/24/12 11:20 PM      Component Value Range   Preg Test, Ur NEGATIVE  NEGATIVE    US Transvaginal Non-ob  12/25/2012  *RADIOLOGY REPORT*  Clinical Data: B U B.  Heavy bleeding.  Left greater than right pain.  TRANSABDOMINAL AND TRANSVAGINAL ULTRASOUND OF PELVIS Technique:  Both transabdominal and transvaginal ultrasound examinations of the pelvis were performed. Transabdominal technique was performed for global imaging of the pelvis including uterus, ovaries, adnexal regions, and pelvic cul-de-sac.  It was necessary to proceed with endovaginal exam following the transabdominal exam to visualize the endometrium and ovaries.  Comparison:  CT abdomen and pelvis 02/08/2012.  Findings:  Uterus: The uterus is anteverted and measures about 8.4 x 4.2 x 6.8 cm.  Mild diffusely heterogeneous myometrial echotexture.  No focal masses are demonstrated. Cysts in the cervical region consistent with Nabothian cysts.  Endometrium: The margins of the endometrium are somewhat indistinct.  Endometrial stripe thickness measures 9 mm.  No endometrial fluid collections.  Right ovary:  The right ovary measures 3.3 x 1.5 x 2.2 cm.   Normal follicular changes.  Flow is demonstrated in the right ovary on color flow Doppler imaging.  No abnormal adnexal masses.  Left ovary: The left ovary measures 3.7 x 2.1 x 2.6 cm.  Normal follicular changes.  Flow is demonstrated in the left ovary on color flow Doppler imaging.  No abnormal adnexal masses.  Other findings: No free fluid  IMPRESSION: Mild diffusely heterogeneous myometrial echotexture.  Indistinct margins of the endometrium.  Changes could represent adenomyosis. Ovaries are unremarkable.   Original Report Authenticated By: Burman Nieves, M.D.    US Pelvis Complete  12/25/2012  *RADIOLOGY REPORT*  Clinical Data:  B U B.  Heavy bleeding.  Left greater than right pain.  TRANSABDOMINAL AND TRANSVAGINAL ULTRASOUND OF PELVIS Technique:  Both transabdominal and transvaginal ultrasound examinations of the pelvis were performed. Transabdominal technique was performed for global imaging of the pelvis including uterus, ovaries, adnexal regions, and pelvic cul-de-sac.  It was necessary to proceed with endovaginal exam following the transabdominal exam to visualize the endometrium and ovaries.  Comparison:  CT abdomen and pelvis 02/08/2012.  Findings:  Uterus: The uterus is anteverted and measures about 8.4 x 4.2 x 6.8 cm.  Mild diffusely heterogeneous myometrial echotexture.  No focal masses are demonstrated. Cysts in the cervical region consistent with Nabothian cysts.  Endometrium: The margins of the endometrium are somewhat indistinct.  Endometrial stripe thickness measures 9 mm.  No endometrial fluid collections.  Right ovary:  The right ovary measures 3.3 x 1.5 x 2.2 cm.  Normal follicular changes.  Flow is demonstrated in the right ovary on color flow Doppler imaging.  No abnormal adnexal masses.  Left ovary: The left ovary measures 3.7 x 2.1 x 2.6 cm.  Normal follicular changes.  Flow is demonstrated in the left ovary on color flow Doppler imaging.  No abnormal adnexal masses.  Other findings: No  free fluid  IMPRESSION: Mild diffusely heterogeneous myometrial echotexture.  Indistinct margins of the endometrium.  Changes could represent adenomyosis. Ovaries are unremarkable.   Original Report Authenticated By: Burman Nieves, M.D.     Assessment: 42 y.o. female with vaginal bleeding   Possible adenomyosis  Plan:  Toradol 60 mg IM now   Provera 10 mg PO now   Rx Provera    Rx Anaprox   Follow up in [facility] Clinic (message sent for them to call patient) I have reviewed this patient's vital signs, nurses notes, appropriate labs and imaging. I have discussed results with the patient and plan of care. Patient voices understanding.   Medication List     As of 12/29/2012  7:56 PM    START taking these medications         HYDROcodone-acetaminophen 5-325 MG per tablet   Commonly known as: NORCO/VICODIN   Take 1 tablet by mouth every 4 (four) hours as needed for pain.      medroxyPROGESTERone 10 MG tablet   Commonly known as: PROVERA   Take 1 tablet (10 mg total) by mouth daily.      naproxen sodium 550 MG tablet   Commonly known as: ANAPROX   Take 1 tablet (550 mg total) by mouth 2 (two) times daily with a meal.      CONTINUE taking these medications         amitriptyline 25 MG tablet   Commonly known as: ELAVIL   Take 1 tablet (25 mg total) by mouth at bedtime.      multivitamin with minerals Tabs      PROTONIX PO          Where to get your medications    These are the prescriptions that you need to pick up. We sent them to a specific pharmacy, so you will need to go there to get them.   [facility] - [place], [place] - [address]    [address] [place] [address]    Phone: [phone]        medroxyPROGESTERone 10 MG tablet   naproxen sodium 550 MG tablet         You may get these medications from  any pharmacy.         HYDROcodone-acetaminophen 5-325 MG per tablet              Oletta Buehring, RN, FNP,  [facility] 12/24/2012, 9:35 PM

## 2012-12-25 LAB — GC/CHLAMYDIA PROBE AMP
CT Probe RNA: NEGATIVE
GC Probe RNA: NEGATIVE

## 2012-12-25 MED ORDER — NAPROXEN SODIUM 550 MG PO TABS: 550.0000 mg | ORAL_TABLET | Freq: Two times a day (BID) | ORAL | Status: DC

## 2012-12-25 MED ORDER — HYDROCODONE-ACETAMINOPHEN 5-325 MG PO TABS: 1.0000 | ORAL_TABLET | ORAL | Status: DC | PRN

## 2012-12-25 MED ORDER — MEDROXYPROGESTERONE ACETATE 10 MG PO TABS: 10.0000 mg | ORAL_TABLET | Freq: Every day | ORAL | Status: DC

## 2013-01-12 ENCOUNTER — Encounter: Payer: Self-pay | Admitting: Obstetrics and Gynecology

## 2013-01-29 ENCOUNTER — Encounter: Payer: Self-pay | Admitting: Obstetrics & Gynecology

## 2013-04-22 ENCOUNTER — Encounter: Payer: Self-pay | Admitting: Obstetrics & Gynecology

## 2013-04-22 ENCOUNTER — Ambulatory Visit (INDEPENDENT_AMBULATORY_CARE_PROVIDER_SITE_OTHER): Payer: Medicaid Other | Admitting: Obstetrics & Gynecology

## 2013-04-22 ENCOUNTER — Other Ambulatory Visit (HOSPITAL_COMMUNITY)
Admission: RE | Admit: 2013-04-22 | Discharge: 2013-04-22 | Disposition: A | Discharge status: Home / Self Care | Payer: Medicaid Other | Source: Ambulatory Visit | Attending: Obstetrics & Gynecology | Admitting: Obstetrics & Gynecology

## 2013-04-22 ENCOUNTER — Other Ambulatory Visit: Payer: Self-pay | Admitting: Obstetrics & Gynecology

## 2013-04-22 VITALS — BP 119/81 | HR 71 | Temp 97.6°F | Ht 62.0 in | Wt 135.5 lb

## 2013-04-22 DIAGNOSIS — N938 Other specified abnormal uterine and vaginal bleeding: Secondary | ICD-10-CM

## 2013-04-22 DIAGNOSIS — Z01419 Encounter for gynecological examination (general) (routine) without abnormal findings: Secondary | ICD-10-CM

## 2013-04-22 DIAGNOSIS — Z1151 Encounter for screening for human papillomavirus (HPV): Secondary | ICD-10-CM | POA: Insufficient documentation

## 2013-04-22 DIAGNOSIS — Z01812 Encounter for preprocedural laboratory examination: Secondary | ICD-10-CM

## 2013-04-22 DIAGNOSIS — Z113 Encounter for screening for infections with a predominantly sexual mode of transmission: Secondary | ICD-10-CM | POA: Insufficient documentation

## 2013-04-22 LAB — POCT PREGNANCY, URINE: Preg Test, Ur: NEGATIVE

## 2013-04-22 MED ORDER — NORGESTIMATE-ETH ESTRADIOL 0.25-35 MG-MCG PO TABS: 1.0000 | ORAL_TABLET | Freq: Every day | ORAL | Status: DC

## 2013-04-22 NOTE — Progress Notes (Addendum)
Patient ID: [name], female   DOB: 08/25/1971, 42 y.o.   MRN: [id number] Subjective:

## 2013-04-22 NOTE — Patient Instructions (Addendum)
Levonorgestrel intrauterine device (IUD) What is this medicine? LEVONORGESTREL IUD (LEE voe nor jes trel) is a contraceptive (birth control) device. It is used to prevent pregnancy and to treat heavy bleeding that occurs during your period. It can be used for up to 5 years. This medicine may be used for other purposes; ask your health care provider or pharmacist if you have questions. What should I tell my health care provider before I take this medicine? They need to know if you have any of these conditions: -abnormal Pap smear -cancer of the breast, uterus, or cervix -diabetes -endometritis -genital or pelvic infection now or in the past -have more than one sexual partner or your partner has more than one partner -heart disease -history of an ectopic or tubal pregnancy -immune system problems -IUD in place -liver disease or tumor -problems with blood clots or take blood-thinners -use intravenous drugs -uterus of unusual shape -vaginal bleeding that has not been explained -an unusual or allergic reaction to levonorgestrel, other hormones, silicone, or polyethylene, medicines, foods, dyes, or preservatives -pregnant or trying to get pregnant -breast-feeding How should I use this medicine? This device is placed inside the uterus by a health care professional. Talk to your pediatrician regarding the use of this medicine in children. Special care may be needed. Overdosage: If you think you have taken too much of this medicine contact a poison control center or emergency room at once. NOTE: This medicine is only for you. Do not share this medicine with others. What if I miss a dose? This does not apply. What may interact with this medicine? Do not take this medicine with any of the following medications: -amprenavir -bosentan -fosamprenavir This medicine may also interact with the following medications: -aprepitant -barbiturate medicines for inducing sleep or treating  seizures -bexarotene -griseofulvin -medicines to treat seizures like carbamazepine, ethotoin, felbamate, oxcarbazepine, phenytoin, topiramate -modafinil -pioglitazone -rifabutin -rifampin -rifapentine -some medicines to treat HIV infection like atazanavir, indinavir, lopinavir, nelfinavir, tipranavir, ritonavir -St. John's wort -warfarin This list may not describe all possible interactions. Give your health care provider a list of all the medicines, herbs, non-prescription drugs, or dietary supplements you use. Also tell them if you smoke, drink alcohol, or use illegal drugs. Some items may interact with your medicine. What should I watch for while using this medicine? Visit your doctor or health care professional for regular check ups. See your doctor if you or your partner has sexual contact with others, becomes HIV positive, or gets a sexual transmitted disease. This product does not protect you against HIV infection (AIDS) or other sexually transmitted diseases. You can check the placement of the IUD yourself by reaching up to the top of your vagina with clean fingers to feel the threads. Do not pull on the threads. It is a good habit to check placement after each menstrual period. Call your doctor right away if you feel more of the IUD than just the threads or if you cannot feel the threads at all. The IUD may come out by itself. You may become pregnant if the device comes out. If you notice that the IUD has come out use a backup birth control method like condoms and call your health care provider. Using tampons will not change the position of the IUD and are okay to use during your period. What side effects may I notice from receiving this medicine? Side effects that you should report to your doctor or health care professional as soon as possible: -allergic reactions   like skin rash, itching or hives, swelling of the face, lips, or tongue -fever, flu-like symptoms -genital sores -high  blood pressure -no menstrual period for 6 weeks during use -pain, swelling, warmth in the leg -pelvic pain or tenderness -severe or sudden headache -signs of pregnancy -stomach cramping -sudden shortness of breath -trouble with balance, talking, or walking -unusual vaginal bleeding, discharge -yellowing of the eyes or skin Side effects that usually do not require medical attention (report to your doctor or health care professional if they continue or are bothersome): -acne -breast pain -change in sex drive or performance -changes in weight -cramping, dizziness, or faintness while the device is being inserted -headache -irregular menstrual bleeding within first 3 to 6 months of use -nausea This list may not describe all possible side effects. Call your doctor for medical advice about side effects. You may report side effects to FDA at 1-800-FDA-1088. Where should I keep my medicine? This does not apply. NOTE: This sheet is a summary. It may not cover all possible information. If you have questions about this medicine, talk to your doctor, pharmacist, or health care provider.  2012, [employer]vier/Gold Standard. (12/17/2008 6:39:08 PM)Dysfunctional Uterine Bleeding Normally, menstrual periods begin between ages 26 to 29 in young women. A normal menstrual cycle/period may begin every 23 days up to 35 days and lasts from 1 to 7 days. Around 12 to 14 days before your menstrual period starts, ovulation (ovary produces an egg) occurs. When counting the time between menstrual periods, count from the first day of bleeding of the previous period to the first day of bleeding of the next period. Dysfunctional (abnormal) uterine bleeding is bleeding that is different from a normal menstrual period. Your periods may come earlier or later than usual. They may be lighter, have blood clots or be heavier. You may have bleeding between periods, or you may skip one period or more. You may have bleeding after sexual  intercourse, bleeding after menopause, or no menstrual period. CAUSES   Pregnancy (normal, miscarriage, tubal).  IUDs (intrauterine device, birth control).  Birth control pills.  Hormone treatment.  Menopause.  Infection of the cervix.  Blood clotting problems.  Infection of the inside lining of the uterus.  Endometriosis, inside lining of the uterus growing in the pelvis and other female organs.  Adhesions (scar tissue) inside the uterus.  Obesity or severe weight loss.  Uterine polyps inside the uterus.  Cancer of the vagina, cervix, or uterus.  Ovarian cysts or polycystic ovary syndrome.  Medical problems (diabetes, thyroid disease).  Uterine fibroids (noncancerous tumor).  Problems with your female hormones.  Endometrial hyperplasia, very thick lining and enlarged cells inside of the uterus.  Medicines that interfere with ovulation.  Radiation to the pelvis or abdomen.  Chemotherapy. DIAGNOSIS   Your doctor will discuss the history of your menstrual periods, medicines you are taking, changes in your weight, stress in your life, and any medical problems you may have.  Your doctor will do a physical and pelvic examination.  Your doctor may want to perform certain tests to make a diagnosis, such as:  Pap test.  Blood tests.  Cultures for infection.  CT scan.  Ultrasound.  Hysteroscopy.  Laparoscopy.  MRI.  Hysterosalpingography.  D and C.  Endometrial biopsy. TREATMENT  Treatment will depend on the cause of the dysfunctional uterine bleeding (DUB). Treatment may include:  Observing your menstrual periods for a couple of months.  Prescribing medicines for medical problems, including:  Antibiotics.  Hormones.  Birth control pills.  Removing an IUD (intrauterine device, birth control).  Surgery:  D and C (scrape and remove tissue from inside the uterus).  Laparoscopy (examine inside the abdomen with a lighted tube).  Uterine  ablation (destroy lining of the uterus with electrical current, laser, heat, or freezing).  Hysteroscopy (examine cervix and uterus with a lighted tube).  Hysterectomy (remove the uterus). HOME CARE INSTRUCTIONS   If medicines were prescribed, take exactly as directed. Do not change or switch medicines without consulting your caregiver.  Long term heavy bleeding may result in iron deficiency. Your caregiver may have prescribed iron pills. They help replace the iron that your body lost from heavy bleeding. Take exactly as directed.  Do not take aspirin or medicines that contain aspirin one week before or during your menstrual period. Aspirin may make the bleeding worse.  If you need to change your sanitary pad or tampon more than once every 2 hours, stay in bed with your feet elevated and a cold pack on your lower abdomen. Rest as much as possible, until the bleeding stops or slows down.  Eat well-balanced meals. Eat foods high in iron. Examples are:  Leafy green vegetables.  Whole-grain breads and cereals.  Eggs.  Meat.  Liver.  Do not try to lose weight until the abnormal bleeding has stopped and your blood iron level is back to normal. Do not lift more than ten pounds or do strenuous activities when you are bleeding.  For a couple of months, make note on your calendar, marking the start and ending of your period, and the type of bleeding (light, medium, heavy, spotting, clots or missed periods). This is for your caregiver to better evaluate your problem. SEEK MEDICAL CARE IF:   You develop nausea (feeling sick to your stomach) and vomiting, dizziness, or diarrhea while you are taking your medicine.  You are getting lightheaded or weak.  You have any problems that may be related to the medicine you are taking.  You develop pain with your DUB.  You want to remove your IUD.  You want to stop or change your birth control pills or hormones.  You have any type of abnormal  bleeding mentioned above.  You are over 72 years old and have not had a menstrual period yet.  You are 42 years old and you are still having menstrual periods.  You have any of the symptoms mentioned above.  You develop a rash. SEEK IMMEDIATE MEDICAL CARE IF:   An oral temperature above 102 F (38.9 C) develops.  You develop chills.  You are changing your sanitary pad or tampon more than once an hour.  You develop abdominal pain.  You pass out or faint. Document Released: 11/23/2000 Document Revised: 02/18/2012 Document Reviewed: 10/25/2009 [facility] Patient Information 2013 [place], [place].

## 2013-04-22 NOTE — Addendum Note (Signed)
Addended by: Willodean Rosenthal on: 04/22/2013 02:51 PM   Modules accepted: Orders

## 2013-04-22 NOTE — Addendum Note (Signed)
Addended by: Willodean Rosenthal on: 04/22/2013 02:34 PM   Modules accepted: Orders

## 2013-04-22 NOTE — Progress Notes (Addendum)
Subjective:     Patient ID: [name], female   DOB: 12-06-1971, 42 y.o.   MRN: [id number]  HPI Pt with h/o heavy bleeding that she has been seen in the MAU for multiple times.  She has not been bleeding heavy since her last visit in Jan.  Past Medical History  Diagnosis Date  . Gastritis   . Hypothyroidism   . Depression   . Chronic headaches   . Anemia   . Irritable bowel syndrome   . GERD (gastroesophageal reflux disease)   . Hypertension   . Myocardial infarction 2004    DIC, hypertension during delivery  . Amniotic fluid embolism 2004  . Respiratory failure, acute 2004   Past Surgical History  Procedure Laterality Date  . Knee arthroscopy    . Dilation and curettage of uterus    . Cardiac catheterization  2004   Current Outpatient Prescriptions on File Prior to Visit  Medication Sig Dispense Refill  . Multiple Vitamin (MULTIVITAMIN WITH MINERALS) TABS Take 1 tablet by mouth daily.      Marland Kitchen amitriptyline (ELAVIL) 25 MG tablet Take 1 tablet (25 mg total) by mouth at bedtime.  30 tablet  5  . HYDROcodone-acetaminophen (NORCO/VICODIN) 5-325 MG per tablet Take 1 tablet by mouth every 4 (four) hours as needed for pain.  10 tablet  0  . medroxyPROGESTERone (PROVERA) 10 MG tablet Take 1 tablet (10 mg total) by mouth daily.  5 tablet  0  . naproxen sodium (ANAPROX DS) 550 MG tablet Take 1 tablet (550 mg total) by mouth 2 (two) times daily with a meal.  15 tablet  0  . Pantoprazole Sodium (PROTONIX PO) Take 1 capsule by mouth daily.       No current facility-administered medications on file prior to visit.   No Known Allergies History   Social History  . Marital Status: Legally Separated    Spouse Name: N/A    Number of Children: 5  . Years of Education: N/A   Occupational History  . Unemployed    Social History Main Topics  . Smoking status: Never Smoker   . Smokeless tobacco: Never Used  . Alcohol Use: No  . Drug Use: No  . Sexually Active: Not on file    Other Topics Concern  . Not on file   Social History Narrative  . No narrative on file        Review of Systems     Objective:   Physical Exam BP 119/81  Pulse 71  Temp(Src) 97.6 F (36.4 C) (Oral)  Ht 5\' 2"  (1.575 m)  Wt 135 lb 8 oz (61.462 kg)  BMI 24.78 kg/m2  LMP 03/29/2013 Pt in NAD   Jan 2014 Findings:  Uterus: The uterus is anteverted and measures about 8.4 x 4.2 x 6.8  cm. Mild diffusely heterogeneous myometrial echotexture. No focal  masses are demonstrated. Cysts in the cervical region consistent  with Nabothian cysts.  Endometrium: The margins of the endometrium are somewhat  indistinct. Endometrial stripe thickness measures 9 mm. No  endometrial fluid collections.  Right ovary: The right ovary measures 3.3 x 1.5 x 2.2 cm. Normal  follicular changes. Flow is demonstrated in the right ovary on  color flow Doppler imaging. No abnormal adnexal masses.  Left ovary: The left ovary measures 3.7 x 2.1 x 2.6 cm. Normal  follicular changes. Flow is demonstrated in the left ovary on  color flow Doppler imaging. No abnormal adnexal masses.  Other findings: No  free fluid  IMPRESSION:  Mild diffusely heterogeneous myometrial echotexture. Indistinct  margins of the endometrium. Changes could represent adenomyosis.  Ovaries are unremarkable.        Assessment:    Annual GYN exam DUB- possible adenomyosis. D/w pt OCP's vs Mirena vs Hyst     Plan:    schedule mammogram F/u PAP and cx Sprintec 1 po q day F/u in 3 months or sooner prn

## 2013-05-06 ENCOUNTER — Other Ambulatory Visit: Payer: Self-pay | Admitting: Gastroenterology

## 2013-05-06 ENCOUNTER — Ambulatory Visit
Admission: RE | Admit: 2013-05-06 | Discharge: 2013-05-06 | Disposition: A | Discharge status: Home / Self Care | Payer: Medicaid Other | Source: Ambulatory Visit | Attending: Gastroenterology | Admitting: Gastroenterology

## 2013-05-06 DIAGNOSIS — R1032 Left lower quadrant pain: Secondary | ICD-10-CM

## 2013-08-31 ENCOUNTER — Other Ambulatory Visit: Payer: Medicaid Other | Admitting: Obstetrics & Gynecology

## 2013-10-19 ENCOUNTER — Encounter: Payer: Self-pay | Admitting: Obstetrics & Gynecology

## 2013-10-19 ENCOUNTER — Ambulatory Visit (INDEPENDENT_AMBULATORY_CARE_PROVIDER_SITE_OTHER): Payer: Medicaid Other | Admitting: Obstetrics & Gynecology

## 2013-10-19 ENCOUNTER — Encounter: Payer: Self-pay | Admitting: *Deleted

## 2013-10-19 ENCOUNTER — Other Ambulatory Visit (HOSPITAL_COMMUNITY)
Admission: RE | Admit: 2013-10-19 | Discharge: 2013-10-19 | Disposition: A | Discharge status: Home / Self Care | Payer: Medicaid Other | Source: Ambulatory Visit | Attending: Obstetrics & Gynecology | Admitting: Obstetrics & Gynecology

## 2013-10-19 VITALS — BP 105/73 | HR 76 | Temp 98.4°F | Ht 62.0 in | Wt 136.4 lb

## 2013-10-19 DIAGNOSIS — O88219 Thromboembolism in pregnancy, unspecified trimester: Secondary | ICD-10-CM

## 2013-10-19 DIAGNOSIS — N946 Dysmenorrhea, unspecified: Secondary | ICD-10-CM

## 2013-10-19 DIAGNOSIS — Z01812 Encounter for preprocedural laboratory examination: Secondary | ICD-10-CM

## 2013-10-19 DIAGNOSIS — N938 Other specified abnormal uterine and vaginal bleeding: Secondary | ICD-10-CM

## 2013-10-19 DIAGNOSIS — Z23 Encounter for immunization: Secondary | ICD-10-CM

## 2013-10-19 DIAGNOSIS — N949 Unspecified condition associated with female genital organs and menstrual cycle: Secondary | ICD-10-CM

## 2013-10-19 MED ORDER — NAPROXEN SODIUM 550 MG PO TABS: 550.0000 mg | ORAL_TABLET | Freq: Two times a day (BID) | ORAL | Status: DC

## 2013-10-19 NOTE — Progress Notes (Signed)
Patient ID: [name], female   DOB: [birth year], 42 y.o.   MRN: [id number] The indications for endometrial biopsy were reviewed.   Risks of the biopsy including cramping, bleeding, infection, uterine perforation, inadequate specimen and need for additional procedures  were discussed. The patient states she understands and agrees to undergo procedure today. Consent was signed. Time out was performed. Urine HCG was negative. A sterile speculum was placed in the patient's vagina and the cervix was prepped with Betadine. A single-toothed tenaculum was placed on the anterior lip of the cervix to stabilize it. The 3 mm pipelle was introduced into the endometrial cavity without difficulty to a depth of 8cm, and a moderate amount of tissue was obtained and sent to pathology. The instruments were removed from the patient's vagina. Minimal bleeding from the cervix was noted. The patient tolerated the procedure well. Routine post-procedure instructions were given to the patient. The patient will follow up to review the results and for further management.  Pt given info on endometrial ablation.  Would not use EES due to pts medical history.

## 2013-10-19 NOTE — Patient Instructions (Addendum)
Dysfunctional Uterine Bleeding Normally, menstrual periods begin between ages 11 to 17 in young women. A normal menstrual cycle/period may begin every 23 days up to 35 days and lasts from 1 to 7 days. Around 12 to 14 days before your menstrual period starts, ovulation (ovary produces an egg) occurs. When counting the time between menstrual periods, count from the first day of bleeding of the previous period to the first day of bleeding of the next period. Dysfunctional (abnormal) uterine bleeding is bleeding that is different from a normal menstrual period. Your periods may come earlier or later than usual. They may be lighter, have blood clots or be heavier. You may have bleeding between periods, or you may skip one period or more. You may have bleeding after sexual intercourse, bleeding after menopause, or no menstrual period. CAUSES   Pregnancy (normal, miscarriage, tubal).  IUDs (intrauterine device, birth control).  Birth control pills.  Hormone treatment.  Menopause.  Infection of the cervix.  Blood clotting problems.  Infection of the inside lining of the uterus.  Endometriosis, inside lining of the uterus growing in the pelvis and other female organs.  Adhesions (scar tissue) inside the uterus.  Obesity or severe weight loss.  Uterine polyps inside the uterus.  Cancer of the vagina, cervix, or uterus.  Ovarian cysts or polycystic ovary syndrome.  Medical problems (diabetes, thyroid disease).  Uterine fibroids (noncancerous tumor).  Problems with your female hormones.  Endometrial hyperplasia, very thick lining and enlarged cells inside of the uterus.  Medicines that interfere with ovulation.  Radiation to the pelvis or abdomen.  Chemotherapy. DIAGNOSIS   Your doctor will discuss the history of your menstrual periods, medicines you are taking, changes in your weight, stress in your life, and any medical problems you may have.  Your doctor will do a physical  and pelvic examination.  Your doctor may want to perform certain tests to make a diagnosis, such as:  Pap test.  Blood tests.  Cultures for infection.  CT scan.  Ultrasound.  Hysteroscopy.  Laparoscopy.  MRI.  Hysterosalpingography.  D and C.  Endometrial biopsy. TREATMENT  Treatment will depend on the cause of the dysfunctional uterine bleeding (DUB). Treatment may include:  Observing your menstrual periods for a couple of months.  Prescribing medicines for medical problems, including:  Antibiotics.  Hormones.  Birth control pills.  Removing an IUD (intrauterine device, birth control).  Surgery:  D and C (scrape and remove tissue from inside the uterus).  Laparoscopy (examine inside the abdomen with a lighted tube).  Uterine ablation (destroy lining of the uterus with electrical current, laser, heat, or freezing).  Hysteroscopy (examine cervix and uterus with a lighted tube).  Hysterectomy (remove the uterus). HOME CARE INSTRUCTIONS   If medicines were prescribed, take exactly as directed. Do not change or switch medicines without consulting your caregiver.  Long term heavy bleeding may result in iron deficiency. Your caregiver may have prescribed iron pills. They help replace the iron that your body lost from heavy bleeding. Take exactly as directed.  Do not take aspirin or medicines that contain aspirin one week before or during your menstrual period. Aspirin may make the bleeding worse.  If you need to change your sanitary pad or tampon more than once every 2 hours, stay in bed with your feet elevated and a cold pack on your lower abdomen. Rest as much as possible, until the bleeding stops or slows down.  Eat well-balanced meals. Eat foods high in iron. Examples   are:  Leafy green vegetables.  Whole-grain breads and cereals.  Eggs.  Meat.  Liver.  Do not try to lose weight until the abnormal bleeding has stopped and your blood iron level is  back to normal. Do not lift more than ten pounds or do strenuous activities when you are bleeding.  For a couple of months, make note on your calendar, marking the start and ending of your period, and the type of bleeding (light, medium, heavy, spotting, clots or missed periods). This is for your caregiver to better evaluate your problem. SEEK MEDICAL CARE IF:   You develop nausea (feeling sick to your stomach) and vomiting, dizziness, or diarrhea while you are taking your medicine.  You are getting lightheaded or weak.  You have any problems that may be related to the medicine you are taking.  You develop pain with your DUB.  You want to remove your IUD.  You want to stop or change your birth control pills or hormones.  You have any type of abnormal bleeding mentioned above.  You are over 16 years old and have not had a menstrual period yet.  You are 42 years old and you are still having menstrual periods.  You have any of the symptoms mentioned above.  You develop a rash. SEEK IMMEDIATE MEDICAL CARE IF:   An oral temperature above 102 F (38.9 C) develops.  You develop chills.  You are changing your sanitary pad or tampon more than once an hour.  You develop abdominal pain.  You pass out or faint. Document Released: 11/23/2000 Document Revised: 02/18/2012 Document Reviewed: 10/25/2009 ExitCare Patient Information 2014 [employer]. Endometrial Ablation Endometrial ablation removes the lining of the uterus (endometrium). It is usually a same-day, outpatient treatment. Ablation helps avoid major surgery, such as surgery to remove the cervix and uterus (hysterectomy). After endometrial ablation, you will have little or no menstrual bleeding and may not be able to have children. However, if you are premenopausal, you will need to use a reliable method of birth control following the procedure because of the small chance that pregnancy can occur. There are different reasons  to have this procedure, which include:  Heavy periods.  Bleeding that is causing anemia.  Irregular bleeding.  Bleeding fibroids on the lining inside the uterus if they are smaller than 3 centimeters. This procedure should not be done if:  You want children in the future.  You have severe cramps with your menstrual period.  You have precancerous or cancerous cells in your uterus.  You were recently pregnant.  You have gone through menopause.  You have had major surgery on the uterus, such as a cesarean delivery. LET YOUR HEALTH CARE PROVIDER KNOW ABOUT:  Any allergies you have.  All medicines you are taking, including vitamins, herbs, eye drops, creams, and over-the-counter medicines.  Previous problems you or members of your family have had with the use of anesthetics.  Any blood disorders you have.  Previous surgeries you have had.  Medical conditions you have. RISKS AND COMPLICATIONS  Generally, this is a safe procedure. However, as with any procedure, complications can occur. Possible complications include:  Perforation of the uterus.  Bleeding.  Infection of the uterus, bladder, or vagina.  Injury to surrounding organs.  An air bubble to the lung (air embolus).  Pregnancy following the procedure.  Failure of the procedure to help the problem, requiring hysterectomy.  Decreased ability to diagnose cancer in the lining of the uterus. BEFORE THE PROCEDURE    The lining of the uterus must be tested to make sure there is no pre-cancerous or cancer cells present.  An ultrasound may be performed to look at the size of the uterus and to check for abnormalities.  Medicines may be given to thin the lining of the uterus. PROCEDURE  During the procedure, your health care provider will use a tool called a resectoscope to help see inside your uterus. There are different ways to remove the lining of your uterus.   Radiofrequency  This method uses a  radiofrequency-alternating electric current to remove the lining of the uterus.  Cryotherapy This method uses extreme cold to freeze the lining of the uterus.  Heated-Free Liquid  This method uses heated salt (saline) solution to remove the lining of the uterus.  Microwave This method uses high-energy microwaves to heat up the lining of the uterus to remove it.  Thermal balloon  This method involves inserting a catheter with a balloon tip into the uterus. The balloon tip is filled with heated fluid to remove the lining of the uterus. AFTER THE PROCEDURE  After your procedure, do not have sexual intercourse or insert anything into your vagina until permitted by your health care provider. After the procedure, you may experience:  Cramps.  Vaginal discharge.  Frequent urination. Document Released: 10/05/2004 Document Revised: 07/29/2013 Document Reviewed: 04/29/2013 ExitCare Patient Information 2014 [employer].  

## 2013-10-27 ENCOUNTER — Encounter: Payer: Self-pay | Admitting: *Deleted

## 2013-10-27 ENCOUNTER — Telehealth: Payer: Self-pay

## 2013-10-27 NOTE — Telephone Encounter (Signed)
Called pt.'s cell phone-- stated number disconnected. Called work phone-- stated they did not know a Psychiatric nurse. Home number invalid. Letter sent to inform pt. Of normal endometrial bx results.

## 2013-10-27 NOTE — Telephone Encounter (Signed)
Message copied by Louanna Raw on Tue Oct 27, 2013 11:42 AM ------      Message from: Willodean Rosenthal      Created: Wed Oct 21, 2013  1:03 PM       Please call pt and notify her of a normal endo bx.            clh-S  ------

## 2013-10-29 ENCOUNTER — Encounter: Payer: Self-pay | Admitting: Obstetrics & Gynecology

## 2013-10-29 ENCOUNTER — Encounter: Payer: Self-pay | Admitting: *Deleted

## 2013-10-29 ENCOUNTER — Ambulatory Visit (INDEPENDENT_AMBULATORY_CARE_PROVIDER_SITE_OTHER): Payer: Medicaid Other | Admitting: Obstetrics & Gynecology

## 2013-10-29 VITALS — BP 125/78 | HR 96 | Temp 98.5°F | Ht 62.0 in | Wt 138.5 lb

## 2013-10-29 DIAGNOSIS — N907 Vulvar cyst: Secondary | ICD-10-CM

## 2013-10-29 DIAGNOSIS — Z113 Encounter for screening for infections with a predominantly sexual mode of transmission: Secondary | ICD-10-CM

## 2013-10-29 DIAGNOSIS — N9089 Other specified noninflammatory disorders of vulva and perineum: Secondary | ICD-10-CM

## 2013-10-29 NOTE — Progress Notes (Signed)
Subjective:     Patient ID: [name], female   DOB: 04-18-1971, 42 y.o.   MRN: [id number]  HPI Pt presents with c/o vulvar lump that she is worried is syphilis.   She reports that she has been reunited with her spouse for 1 1/2 weeks after some time and reports that   She has a lump on her labia that she is worried is syphilis.  She denies discharge or any related sx.  She reports that she had a HIV test ~6 months prev     Review of Systems     Objective:   Physical Exam BP 125/78  Pulse 96  Temp(Src) 98.5 F (36.9 C)  Ht 5\' 2"  (1.575 m)  Wt 138 lb 8 oz (62.823 kg)  BMI 25.33 kg/m2  LMP 10/12/2013 Pt in NAD  GU: EGBUS: small mobile cyst on right labia majus Vagina: no blood in vault Cervix: no lesion; no mucopurulent d/c; no CMT Uterus: small, mobile Adnexa: no masses; non tender        Assessment:     STI screen Inclusion cyst on vulva  reviewed safe sex     Plan:     F/u GC and chlamydia Labs: RPR, hepatitis panel, HIV F/u prn

## 2013-10-29 NOTE — Patient Instructions (Signed)
Sexually Transmitted Disease °Sexually transmitted disease (STD) refers to any infection that is passed from person to person during sexual activity. This may happen by way of saliva, semen, blood, vaginal mucus, or urine. Common STDs include: °· Gonorrhea. °· Chlamydia. °· Syphilis. °· HIV/AIDS. °· Genital herpes. °· Hepatitis B and C. °· Trichomonas. °· Human papillomavirus (HPV). °· Pubic lice. °CAUSES  °An STD may be spread by bacteria, virus, or parasite. A person can get an STD by: °· Sexual intercourse with an infected person. °· Sharing sex toys with an infected person. °· Sharing needles with an infected person. °· Having intimate contact with the genitals, mouth, or rectal areas of an infected person. °SYMPTOMS  °Some people may not have any symptoms, but they can still pass the infection to others. Different STDs have different symptoms. Symptoms include: °· Painful or bloody urination. °· Pain in the pelvis, abdomen, vagina, anus, throat, or eyes. °· Skin rash, itching, irritation, growths, or sores (lesions). These usually occur in the genital or anal area. °· Abnormal vaginal discharge. °· Penile discharge in men. °· Soft, flesh-colored skin growths in the genital or anal area. °· Fever. °· Pain or bleeding during sexual intercourse. °· Swollen glands in the groin area. °· Yellow skin and eyes (jaundice). This is seen with hepatitis. °DIAGNOSIS  °To make a diagnosis, your caregiver may: °· Take a medical history. °· Perform a physical exam. °· Take a specimen (culture) to be examined. °· Examine a sample of discharge under a microscope. °· Perform blood tests. °· Perform a Pap test, if this applies. °· Perform a colposcopy. °· Perform a laparoscopy. °TREATMENT  °· Chlamydia, gonorrhea, trichomonas, and syphilis can be cured with antibiotic medicine. °· Genital herpes, hepatitis, and HIV can be treated, but not cured, with prescribed medicines. The medicines will lessen the symptoms. °· Genital warts  from HPV can be treated with medicine or by freezing, burning (electrocautery), or surgery. Warts may come back. °· HPV is a virus and cannot be cured with medicine or surgery. However, abnormal areas may be followed very closely by your caregiver and may be removed from the cervix, vagina, or vulva through office procedures or surgery. °If your diagnosis is confirmed, your recent sexual partners need treatment. This is true even if they are symptom-free or have a negative culture or evaluation. They should not have sex until their caregiver says it is okay. °HOME CARE INSTRUCTIONS °· All sexual partners should be informed, tested, and treated for all STDs. °· Take your antibiotics as directed. Finish them even if you start to feel better. °· Only take over-the-counter or prescription medicines for pain, discomfort, or fever as directed by your caregiver. °· Rest. °· Eat a balanced diet and drink enough fluids to keep your urine clear or pale yellow. °· Do not have sex until treatment is completed and you have followed up with your caregiver. STDs should be checked after treatment. °· Keep all follow-up appointments, Pap tests, and blood tests as directed by your caregiver. °· Only use latex condoms and water-soluble lubricants during sexual activity. Do not use petroleum jelly or oils. °· Avoid alcohol and illegal drugs. °· Get vaccinated for HPV and hepatitis. If you have not received these vaccines in the past, talk to your caregiver about whether one or both might be right for you. °· Avoid risky sex practices that can break the skin. °The only way to avoid getting an STD is to avoid all sexual activity. Latex condoms and dental   dams (for oral sex) will help lessen the risk of getting an STD, but will not completely eliminate the risk. °SEEK MEDICAL CARE IF:  °· You have a fever. °· You have any new or worsening symptoms. °Document Released: 02/16/2003 Document Revised: 02/18/2012 Document Reviewed:  06/16/2013 °ExitCare® Patient Information ©2014 [employer]. ° °

## 2013-10-30 ENCOUNTER — Telehealth: Payer: Self-pay | Admitting: Obstetrics & Gynecology

## 2013-10-30 LAB — HEPATITIS PANEL, ACUTE
HCV Ab: NEGATIVE
Hep A IgM: NONREACTIVE
Hep B C IgM: NONREACTIVE
Hepatitis B Surface Ag: NEGATIVE

## 2013-10-30 LAB — RPR

## 2013-10-30 NOTE — Telephone Encounter (Signed)
I placed a call to pt she called back using caller ID.  Pt was notified that her STI screening blood tests were all negative.  Pt previously told that she would need to f/u in 3 months if she felt that she needed a repeat HIV test.  Eber Jones L. Harraway-Smith, M.D., [facility]n Core

## 2013-10-30 NOTE — Telephone Encounter (Signed)
T.C. To pt.  To notify of neg STI screen.  Did NOT leave msg.  Forwarded to the ofc nurses to try to contact pt.  Kelsi Benham L. Harraway-Smith, M.D., [place]

## 2013-11-04 ENCOUNTER — Encounter: Payer: Self-pay | Admitting: *Deleted

## 2013-11-09 ENCOUNTER — Telehealth: Payer: Self-pay

## 2013-11-09 NOTE — Telephone Encounter (Signed)
Pt called and stated that she wanted to get her Novasure done. Called pt and pt informed me that she wanted to get her Novasure.  Pt stated that she called the front desk and they informed her that she needed to schedule another appt.  I informed pt that her last visit to discuss options and now that she has chosen to do the Novasure that is a procedure that is done in the OR and that she would need to schedule an appt for a surgical consult.  Pt stated "ok".  I advised pt that I would give her contact information to the front desk and they will call with an appt for surgical consult.  Pt stated understanding.

## 2013-11-10 ENCOUNTER — Encounter: Payer: Self-pay | Admitting: Obstetrics & Gynecology

## 2013-11-20 ENCOUNTER — Encounter (HOSPITAL_COMMUNITY): Payer: Self-pay | Admitting: Pharmacist

## 2013-11-23 NOTE — Patient Instructions (Addendum)
   Your procedure is scheduled on:  Friday, Dec 19  Enter through the [employer] of [facility] at:  830 AM Pick up the phone at the desk and dial [phone] and inform us of your arrival.  Please call this number if you have any problems the morning of surgery: [phone]  Remember: Do not eat or drink after midnight: Thursday Take these medicines the morning of surgery with a SIP OF WATER:   None  Do not wear jewelry, make-up, or FINGER nail polish No metal in your hair or on your body. Do not wear lotions, powders, perfumes. You may wear deodorant.  Please use your CHG wash as directed prior to surgery.  Do not shave anywhere for at least 12 hours prior to first CHG shower.  Do not bring valuables to the hospital. Contacts, dentures or bridgework may not be worn into surgery.  Patients discharged on the day of surgery will not be allowed to drive home.  Home with daughter Kathlen Mody cell  [phone].

## 2013-11-24 ENCOUNTER — Encounter (HOSPITAL_COMMUNITY)
Admission: RE | Admit: 2013-11-24 | Discharge: 2013-11-24 | Disposition: A | Discharge status: Home / Self Care | Payer: Medicaid Other | Source: Ambulatory Visit | Attending: Obstetrics & Gynecology | Admitting: Obstetrics & Gynecology

## 2013-11-24 ENCOUNTER — Encounter (HOSPITAL_COMMUNITY): Payer: Self-pay

## 2013-11-24 LAB — CBC
MCH: 32 pg (ref 26.0–34.0)
MCHC: 33.5 g/dL (ref 30.0–36.0)
MCV: 95.5 fL (ref 78.0–100.0)
Platelets: 237 10*3/uL (ref 150–400)
RBC: 3.59 MIL/uL — ABNORMAL LOW (ref 3.87–5.11)
RDW: 14 % (ref 11.5–15.5)

## 2013-11-27 ENCOUNTER — Encounter (HOSPITAL_COMMUNITY)
Admission: RE | Disposition: A | Discharge status: Home / Self Care | Payer: Self-pay | Source: Ambulatory Visit | Attending: Obstetrics & Gynecology

## 2013-11-27 ENCOUNTER — Encounter (HOSPITAL_COMMUNITY): Payer: Self-pay | Admitting: *Deleted

## 2013-11-27 ENCOUNTER — Ambulatory Visit (HOSPITAL_COMMUNITY)
Admission: RE | Admit: 2013-11-27 | Discharge: 2013-11-27 | Disposition: A | Discharge status: Home / Self Care | Payer: Medicaid Other | Source: Ambulatory Visit | Attending: Obstetrics & Gynecology | Admitting: Obstetrics & Gynecology

## 2013-11-27 ENCOUNTER — Other Ambulatory Visit: Payer: Self-pay | Admitting: Obstetrics and Gynecology

## 2013-11-27 ENCOUNTER — Ambulatory Visit (HOSPITAL_COMMUNITY): Payer: Medicaid Other | Admitting: Anesthesiology

## 2013-11-27 ENCOUNTER — Encounter (HOSPITAL_COMMUNITY): Payer: Medicaid Other | Admitting: Anesthesiology

## 2013-11-27 DIAGNOSIS — N949 Unspecified condition associated with female genital organs and menstrual cycle: Secondary | ICD-10-CM | POA: Insufficient documentation

## 2013-11-27 DIAGNOSIS — N920 Excessive and frequent menstruation with regular cycle: Secondary | ICD-10-CM

## 2013-11-27 DIAGNOSIS — N938 Other specified abnormal uterine and vaginal bleeding: Secondary | ICD-10-CM | POA: Insufficient documentation

## 2013-11-27 HISTORY — PX: HYSTEROSCOPY WITH NOVASURE: SHX5574

## 2013-11-27 LAB — PREGNANCY, URINE: Preg Test, Ur: NEGATIVE

## 2013-11-27 SURGERY — HYSTEROSCOPY WITH NOVASURE
Anesthesia: General | Site: Vagina

## 2013-11-27 MED ORDER — ONDANSETRON HCL 4 MG/2ML IJ SOLN
INTRAMUSCULAR | Status: AC
  Filled 2013-11-27: qty 2

## 2013-11-27 MED ORDER — LACTATED RINGERS IV SOLN
INTRAVENOUS | Status: DC
  Administered 2013-11-27 (×2): via INTRAVENOUS

## 2013-11-27 MED ORDER — PROPOFOL 10 MG/ML IV BOLUS
INTRAVENOUS | Status: DC | PRN
  Administered 2013-11-27: 180 mg via INTRAVENOUS

## 2013-11-27 MED ORDER — FENTANYL CITRATE 0.05 MG/ML IJ SOLN
INTRAMUSCULAR | Status: AC
  Filled 2013-11-27: qty 2

## 2013-11-27 MED ORDER — PHENYLEPHRINE 40 MCG/ML (10ML) SYRINGE FOR IV PUSH (FOR BLOOD PRESSURE SUPPORT)
PREFILLED_SYRINGE | INTRAVENOUS | Status: AC
  Filled 2013-11-27: qty 5

## 2013-11-27 MED ORDER — MEPERIDINE HCL 25 MG/ML IJ SOLN
6.2500 mg | INTRAMUSCULAR | Status: DC | PRN
  Administered 2013-11-27: 12.5 mg via INTRAVENOUS

## 2013-11-27 MED ORDER — LIDOCAINE HCL (CARDIAC) 20 MG/ML IV SOLN
INTRAVENOUS | Status: DC | PRN
  Administered 2013-11-27: 50 mg via INTRAVENOUS

## 2013-11-27 MED ORDER — IBUPROFEN 600 MG PO TABS: 600.0000 mg | ORAL_TABLET | Freq: Four times a day (QID) | ORAL | Status: DC | PRN

## 2013-11-27 MED ORDER — LACTATED RINGERS IV SOLN
INTRAVENOUS | Status: DC
  Administered 2013-11-27: 08:00:00 via INTRAVENOUS

## 2013-11-27 MED ORDER — BUPIVACAINE HCL (PF) 0.5 % IJ SOLN
INTRAMUSCULAR | Status: AC
  Filled 2013-11-27: qty 30

## 2013-11-27 MED ORDER — LIDOCAINE HCL (CARDIAC) 20 MG/ML IV SOLN
INTRAVENOUS | Status: AC
  Filled 2013-11-27: qty 5

## 2013-11-27 MED ORDER — FENTANYL CITRATE 0.05 MG/ML IJ SOLN
25.0000 ug | INTRAMUSCULAR | Status: DC | PRN
  Administered 2013-11-27: 25 ug via INTRAVENOUS

## 2013-11-27 MED ORDER — METOCLOPRAMIDE HCL 5 MG/ML IJ SOLN: 10.0000 mg | Freq: Once | INTRAMUSCULAR | Status: DC | PRN

## 2013-11-27 MED ORDER — BUPIVACAINE HCL (PF) 0.5 % IJ SOLN
INTRAMUSCULAR | Status: DC | PRN
  Administered 2013-11-27: 20 mL

## 2013-11-27 MED ORDER — PROPOFOL 10 MG/ML IV EMUL
INTRAVENOUS | Status: AC
  Filled 2013-11-27: qty 20

## 2013-11-27 MED ORDER — ONDANSETRON HCL 4 MG/2ML IJ SOLN
INTRAMUSCULAR | Status: DC | PRN
  Administered 2013-11-27: 4 mg via INTRAVENOUS

## 2013-11-27 MED ORDER — MIDAZOLAM HCL 2 MG/2ML IJ SOLN
INTRAMUSCULAR | Status: DC | PRN
  Administered 2013-11-27: 2 mg via INTRAVENOUS

## 2013-11-27 MED ORDER — MEPERIDINE HCL 25 MG/ML IJ SOLN
INTRAMUSCULAR | Status: AC
  Filled 2013-11-27: qty 1

## 2013-11-27 MED ORDER — PHENYLEPHRINE HCL 10 MG/ML IJ SOLN
INTRAMUSCULAR | Status: DC | PRN
  Administered 2013-11-27 (×2): 80 ug via INTRAVENOUS
  Administered 2013-11-27: 40 ug via INTRAVENOUS

## 2013-11-27 MED ORDER — FENTANYL CITRATE 0.05 MG/ML IJ SOLN
INTRAMUSCULAR | Status: DC | PRN
  Administered 2013-11-27: 100 ug via INTRAVENOUS

## 2013-11-27 MED ORDER — DEXAMETHASONE SODIUM PHOSPHATE 10 MG/ML IJ SOLN
INTRAMUSCULAR | Status: AC
  Filled 2013-11-27: qty 1

## 2013-11-27 MED ORDER — FENTANYL CITRATE 0.05 MG/ML IJ SOLN
INTRAMUSCULAR | Status: AC
  Administered 2013-11-27: 25 ug via INTRAVENOUS
  Filled 2013-11-27: qty 2

## 2013-11-27 MED ORDER — DEXAMETHASONE SODIUM PHOSPHATE 10 MG/ML IJ SOLN
INTRAMUSCULAR | Status: DC | PRN
  Administered 2013-11-27: 10 mg via INTRAVENOUS

## 2013-11-27 MED ORDER — LACTATED RINGERS IV SOLN
INTRAVENOUS | Status: DC | PRN
  Administered 2013-11-27: 1000 mL via INTRAUTERINE

## 2013-11-27 MED ORDER — KETOROLAC TROMETHAMINE 30 MG/ML IJ SOLN
INTRAMUSCULAR | Status: DC | PRN
  Administered 2013-11-27: 30 mg via INTRAVENOUS

## 2013-11-27 MED ORDER — MIDAZOLAM HCL 2 MG/2ML IJ SOLN
INTRAMUSCULAR | Status: AC
  Filled 2013-11-27: qty 2

## 2013-11-27 SURGICAL SUPPLY — 13 items
ABLATOR ENDOMETRIAL BIPOLAR (ABLATOR) ×2 IMPLANT
CATH ROBINSON RED A/P 16FR (CATHETERS) ×2 IMPLANT
CLOTH BEACON ORANGE TIMEOUT ST (SAFETY) ×2 IMPLANT
CONTAINER PREFILL 10% NBF 60ML (FORM) ×2 IMPLANT
DRSG TELFA 3X8 NADH (GAUZE/BANDAGES/DRESSINGS) ×2 IMPLANT
GLOVE BIO SURGEON STRL SZ7 (GLOVE) ×2 IMPLANT
GLOVE BIOGEL PI IND STRL 7.0 (GLOVE) ×1 IMPLANT
GLOVE BIOGEL PI INDICATOR 7.0 (GLOVE) ×1
GOWN STRL REIN XL XLG (GOWN DISPOSABLE) ×4 IMPLANT
PACK HYSTEROSCOPY LF (CUSTOM PROCEDURE TRAY) ×2 IMPLANT
PAD OB MATERNITY 4.3X12.25 (PERSONAL CARE ITEMS) ×2 IMPLANT
TOWEL OR 17X24 6PK STRL BLUE (TOWEL DISPOSABLE) ×4 IMPLANT
WATER STERILE IRR 1000ML POUR (IV SOLUTION) ×2 IMPLANT

## 2013-11-27 NOTE — Anesthesia Preprocedure Evaluation (Signed)
Anesthesia Evaluation  Patient identified by MRN, date of birth, ID band Patient awake    Reviewed: Allergy & Precautions, H&P , NPO status , Patient's Chart, lab work & pertinent test results  Airway Mallampati: II TM Distance: >3 FB Neck ROM: Full    Dental no notable dental hx. 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   Neuro/Psych  Headaches, PSYCHIATRIC DISORDERS Depression    GI/Hepatic GERD-  Medicated and Controlled,IBS   Endo/Other  Hypothyroidism   Renal/GU   negative genitourinary   Musculoskeletal negative musculoskeletal ROS (+)   Abdominal   Peds  Hematology  (+) anemia ,   Anesthesia Other Findings   Reproductive/Obstetrics DUB  Adenomyosis uterus                           Anesthesia Physical Anesthesia Plan  ASA: II  Anesthesia Plan: General   Post-op Pain Management:    Induction: Intravenous  Airway Management Planned: LMA  Additional Equipment:   Intra-op Plan:   Post-operative Plan: Extubation in OR  Informed Consent: I have reviewed the patients History and Physical, chart, labs and discussed the procedure including the risks, benefits and alternatives for the proposed anesthesia with the patient or authorized representative who has indicated his/her understanding and acceptance.   Dental advisory given  Plan Discussed with: Anesthesiologist, CRNA and Surgeon  Anesthesia Plan Comments:         Anesthesia Quick Evaluation

## 2013-11-27 NOTE — Transfer of Care (Signed)
Immediate Anesthesia Transfer of Care Note  Patient: [name]  Procedure(s) Performed: Procedure(s): HYSTEROSCOPY WITH NOVASURE (N/A)  Patient Location: PACU  Anesthesia Type:General  Level of Consciousness: awake  Airway & Oxygen Therapy: Patient Spontanous Breathing  Post-op Assessment: Report given to PACU RN  Post vital signs: stable  Filed Vitals:   11/27/13 0744  BP: 123/75  Pulse: 77  Temp: 36.8 C  Resp: 18    Complications: No apparent anesthesia complications

## 2013-11-27 NOTE — Progress Notes (Signed)
Pt calls, with increasing pain after endometrial ablation this morning, not releived by ibuprofen. Tramadol 50 mg x 15 tabs called to [employer].

## 2013-11-27 NOTE — Op Note (Signed)
PRE-OPERATIVE DIAGNOSIS: Dysfunctional Uterine Bleeding  POST-OPERATIVE DIAGNOSIS:Dysfunctional Uterine Bleeding  PROCEDURE: Procedure(s): Hysteroscopy with dilation and curettage and  NOVASURE ENDOMETRIAL ABLATION  SURGEON: Willodean Rosenthal, M.D. ASSISTANTS: none  ANESTHESIA: Genreal with LMA / Paracervical block with 0.5% Marcaine IVF 1500cc EBL: <5cc  SPECIMEN: endometrial currettings  PLAN OF CARE: Discharge to home after PACU  PATIENT DISPOSITION: PACU - hemodynamically stable.   The risks, benefits, and alternatives of surgery were explained, understood, and accepted. The consents were signed and all questions were answered. She was taken to the operating room and general anesthesia was applied without complication. She was placed in the dorsal lithotomy position and her vagina and abdomen were prepped and draped in the usual sterile fashion. A bimanual exam revealed a normal size and shape anteverted mobile uterus. Her adnexa were non-enlarged.   A bivalved speculum was placed in the patients' vagina and the anterior lip of the cervix was grasped with a single toothed tenaculum. A paracervical block was performed at 5 and 7 o'clock with 20cc of 0.5% Marcaine.   The endometrial cavity was sounded to 8.5cm and the endocervical length measured 2cm. A hysteroscope was inserted and the endometrium was noted to be thickened.  The scope was removed and a sharp currete was used to scape the lining of the uterus until a gritty texture was noted throughout.  Specimens were sent to pathology.  The NovaSure device was then inserted and seated using 6.5cm as the cavity length and 3.0cm as the cavity width.  The total activation time was 85 sec.  The hysteroscope was reinserted and an even burn pattern was noted to the fundus.  The single toothed tenaculum was removed at the end of the case and no bleeding was noted from the cervix.   The patient was extubated and taken to the recovery room in stable  condition.  Sponge, lap and instrument counts were correct.  There were no complications noted. [name], M.D., [facility]

## 2013-11-27 NOTE — H&P (Signed)
HPI Pt with h/o heavy bleeding that she has been seen in the MAU for multiple times. Her last cycles was Nov 24th.  Pt reports bleeding between cycles as well as heavy bleeding.  Past Medical History   Diagnosis  Date   .  Gastritis    .  Hypothyroidism    .  Depression    .  Chronic headaches    .  Anemia    .  Irritable bowel syndrome    .  GERD (gastroesophageal reflux disease)    .  Hypertension    .  Myocardial infarction  2004     DIC, hypertension during delivery   .  Amniotic fluid embolism  2004   .  Respiratory failure, acute  2004    Past Surgical History   Procedure  Laterality  Date   .  Knee arthroscopy     .  Dilation and curettage of uterus     .  Cardiac catheterization   2004    Current Outpatient Prescriptions on File Prior to Visit   Medication  Sig  Dispense  Refill   .  Multiple Vitamin (MULTIVITAMIN WITH MINERALS) TABS  Take 1 tablet by mouth daily.     Marland Kitchen  amitriptyline (ELAVIL) 25 MG tablet  Take 1 tablet (25 mg total) by mouth at bedtime.  30 tablet  5   .  HYDROcodone-acetaminophen (NORCO/VICODIN) 5-325 MG per tablet  Take 1 tablet by mouth every 4 (four) hours as needed for pain.  10 tablet  0   .  medroxyPROGESTERone (PROVERA) 10 MG tablet  Take 1 tablet (10 mg total) by mouth daily.  5 tablet  0   .  naproxen sodium (ANAPROX DS) 550 MG tablet  Take 1 tablet (550 mg total) by mouth 2 (two) times daily with a meal.  15 tablet  0   .  Pantoprazole Sodium (PROTONIX PO)  Take 1 capsule by mouth daily.      No current facility-administered medications on file prior to visit.   No Known Allergies  History    Social History   .  Marital Status:  Legally Separated     Spouse Name:  N/A     Number of Children:  5   .  Years of Education:  N/A    Occupational History   .  Unemployed     Social History Main Topics   .  Smoking status:  Never Smoker   .  Smokeless tobacco:  Never Used   .  Alcohol Use:  No   .  Drug Use:  No   .  Sexually Active:   Not on file    Other Topics  Concern   .  Not on file    Social History Narrative   .  No narrative on file   Review of Systems  Objective:   Physical Exam BP 123/75  Pulse 77  Temp(Src) 98.2 F (36.8 C) (Oral)  Resp 18  SpO2 100%  LMP 11/02/2013  Pt in NAD  Jan 2014  Findings:  Uterus: The uterus is anteverted and measures about 8.4 x 4.2 x 6.8  cm. Mild diffusely heterogeneous myometrial echotexture. No focal  masses are demonstrated. Cysts in the cervical region consistent  with Nabothian cysts.  Endometrium: The margins of the endometrium are somewhat  indistinct. Endometrial stripe thickness measures 9 mm. No  endometrial fluid collections.  Right ovary: The right ovary measures 3.3 x 1.5 x 2.2  cm. Normal  follicular changes. Flow is demonstrated in the right ovary on  color flow Doppler imaging. No abnormal adnexal masses.  Left ovary: The left ovary measures 3.7 x 2.1 x 2.6 cm. Normal  follicular changes. Flow is demonstrated in the left ovary on  color flow Doppler imaging. No abnormal adnexal masses.  Other findings: No free fluid  IMPRESSION:  Mild diffusely heterogeneous myometrial echotexture. Indistinct  margins of the endometrium. Changes could represent adenomyosis.  Ovaries are unremarkable.   CBC    Component Value Date/Time   WBC 7.1 11/24/2013 1155   RBC 3.59* 11/24/2013 1155   HGB 11.5* 11/24/2013 1155   HCT 34.3* 11/24/2013 1155   PLT 237 11/24/2013 1155   MCV 95.5 11/24/2013 1155   MCH 32.0 11/24/2013 1155   MCHC 33.5 11/24/2013 1155   RDW 14.0 11/24/2013 1155   LYMPHSABS 1.9 01/10/2011 2222   MONOABS 0.6 01/10/2011 2222   EOSABS 0.1 01/10/2011 2222   BASOSABS 0.0 01/10/2011 2222     Assessment:    DUB- possible adenomyosis.  Pt requests endometrial ablation.  She believes that the pain is directly related to the bleeding.  She was informed that if it is adenomyosis the ablation could make the pain worse and the treatment would potentially be  a hysterectomy.  She wishes to proceed with the ablation to see if the procedure controls the bleeding and therefore the pain.  Plan:   Patient desires surgical management with hysteroscopy with dilation and curettage and endometrial ablation.  The risks of surgery were discussed in detail with the patient including but not limited to: bleeding which may require transfusion or reoperation; infection which may require prolonged hospitalization or re-hospitalization and antibiotic therapy; injury to bowel, bladder, ureters and major vessels or other surrounding organs; need for additional procedures including laparotomy; thromboembolic phenomenon, incisional problems and other postoperative or anesthesia complications.  Patient was told that the likelihood that her condition and symptoms will be treated effectively with this surgical management was very high; the postoperative expectations were also discussed in detail. The patient also understands the alternative treatment options which were discussed in full. All questions were answered.  She has no family present with her today.

## 2013-11-27 NOTE — Brief Op Note (Signed)
11/27/2013  9:46 AM  PATIENT:  [name]  42 y.o. female  PRE-OPERATIVE DIAGNOSIS:   - Dysfunctional uterine bleeding, possible adenomyosis  POST-OPERATIVE DIAGNOSIS:  dysfunctional uterine bleeding  PROCEDURE:  Procedure(s): HYSTEROSCOPY WITH NOVASURE (N/A)  SURGEON:  Surgeon(s) and Role:    * Willodean Rosenthal, MD - Primary  ANESTHESIA:   general  EBL:  Total I/O In: 1500 [I.V.:1500] Out: 210 [Urine:200; Blood:10]  BLOOD ADMINISTERED:none  DRAINS: none   LOCAL MEDICATIONS USED:  MARCAINE     SPECIMEN:  Source of Specimen:  endometrial curretting  DISPOSITION OF SPECIMEN:  PATHOLOGY  COUNTS:  YES  TOURNIQUET:  * No tourniquets in log *  DICTATION: .Note written in EPIC  PLAN OF CARE: Discharge to home after PACU  PATIENT DISPOSITION:  PACU - hemodynamically stable.   Delay start of Pharmacological VTE agent (>24hrs) due to surgical blood loss or risk of bleeding: yes

## 2013-11-27 NOTE — Anesthesia Postprocedure Evaluation (Signed)
  Anesthesia Post-op Note  Patient: [name]  Procedure(s) Performed: Procedure(s): HYSTEROSCOPY WITH NOVASURE (N/A)  Patient Location: PACU  Anesthesia Type:General  Level of Consciousness: awake, alert  and oriented  Airway and Oxygen Therapy: Patient Spontanous Breathing  Post-op Pain: mild  Post-op Assessment: Post-op Vital signs reviewed, Patient's Cardiovascular Status Stable, Respiratory Function Stable, Patent Airway, No signs of Nausea or vomiting and Pain level controlled  Post-op Vital Signs: Reviewed and stable  Complications: No apparent anesthesia complications

## 2013-11-30 ENCOUNTER — Encounter (HOSPITAL_COMMUNITY): Payer: Self-pay | Admitting: Obstetrics & Gynecology

## 2013-12-13 IMAGING — US US TRANSVAGINAL NON-OB
1 series · 13 of 25 positions shown · non-contrast
Comparison: CT abdomen and pelvis 02/08/2012.

CLINICAL DATA: B U B.  Heavy bleeding.  Left greater than right
pain.



[Series 1: us pelvis complete · 13 of 29 slices shown]
[im 1/29]
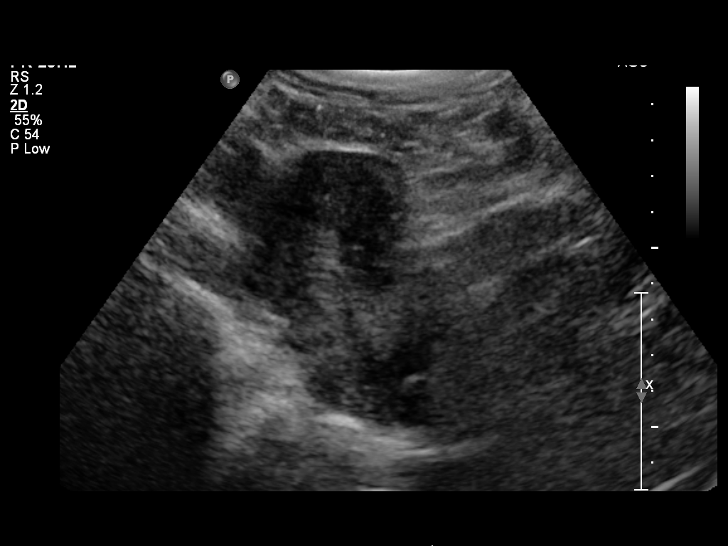
[im 3/29]
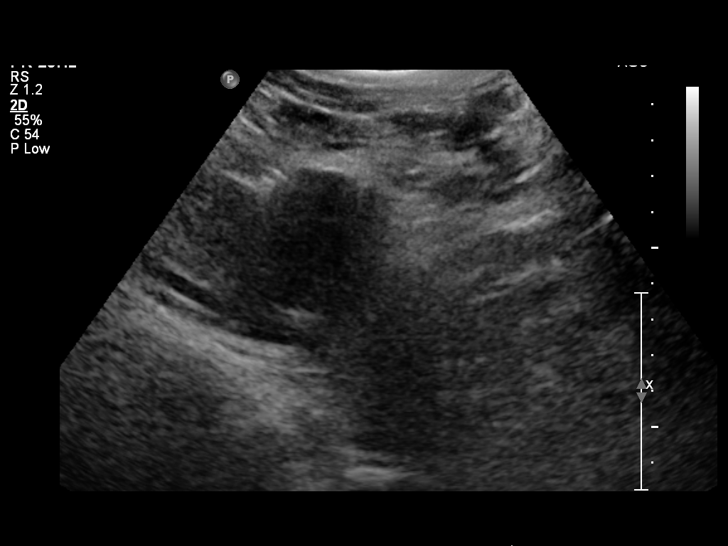
[im 5/29]
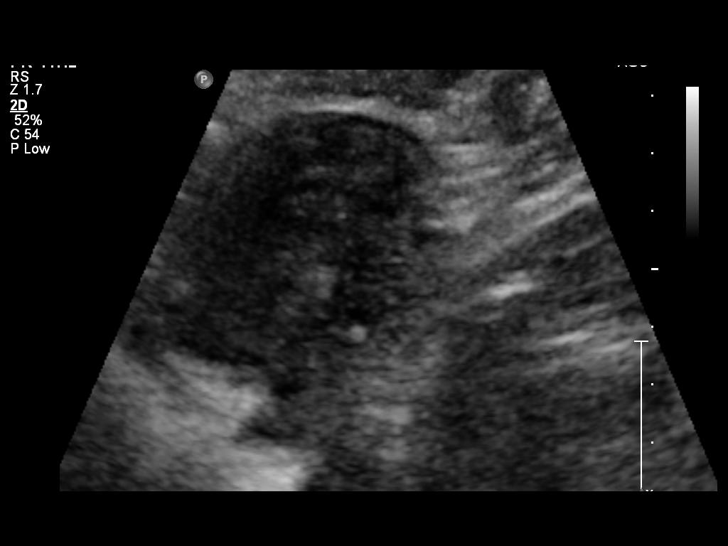
[im 8/29]
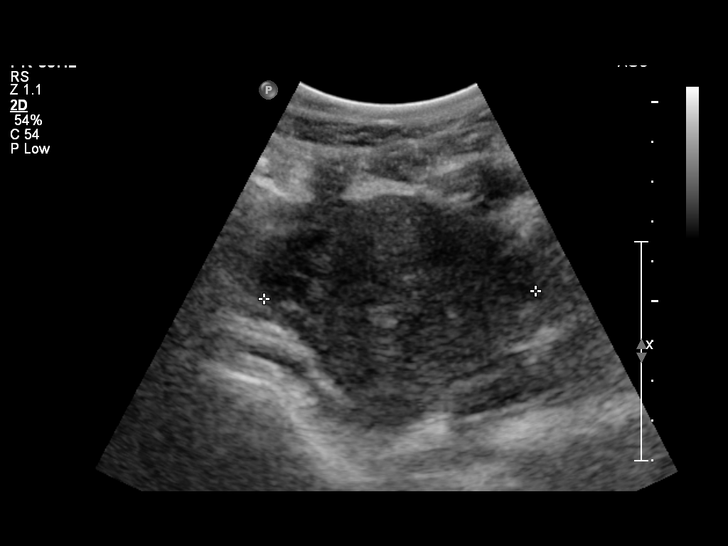
[im 10/29]
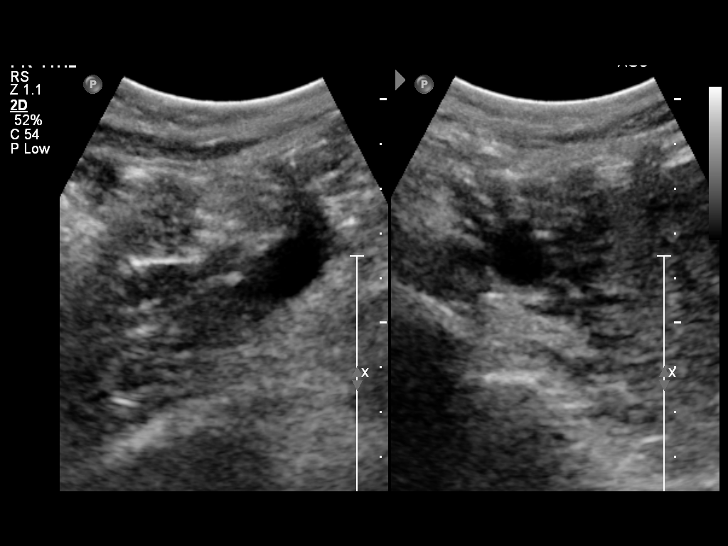
[im 12/29]
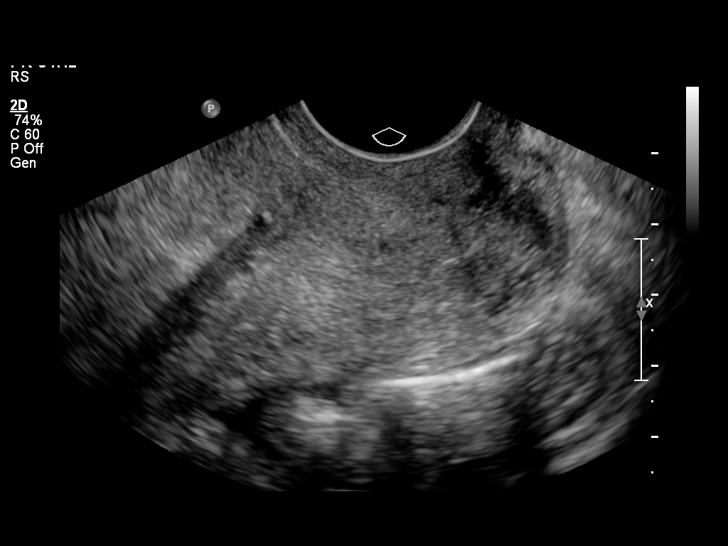
[im 15/29]
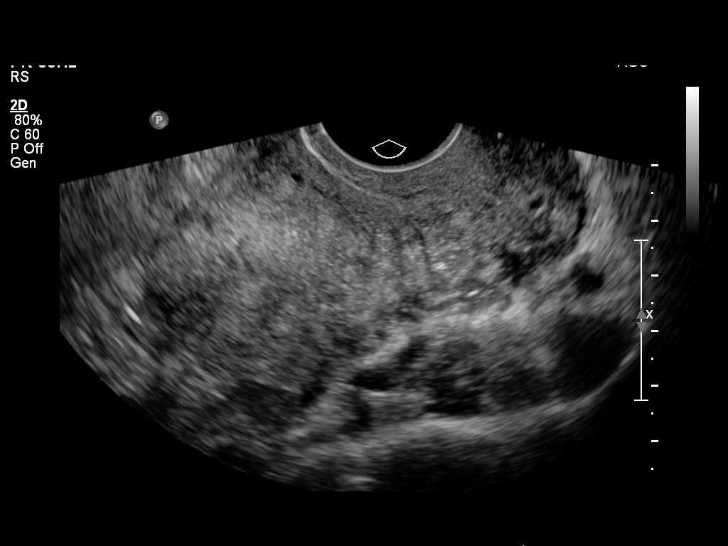
[im 17/29]
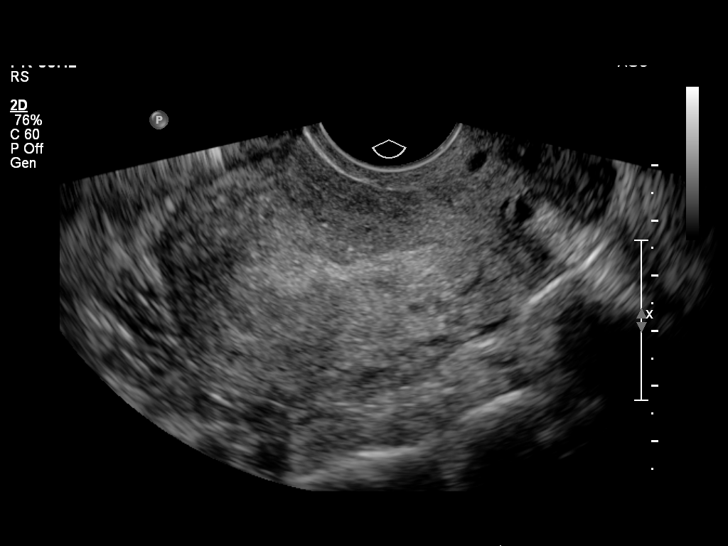
[im 19/29]
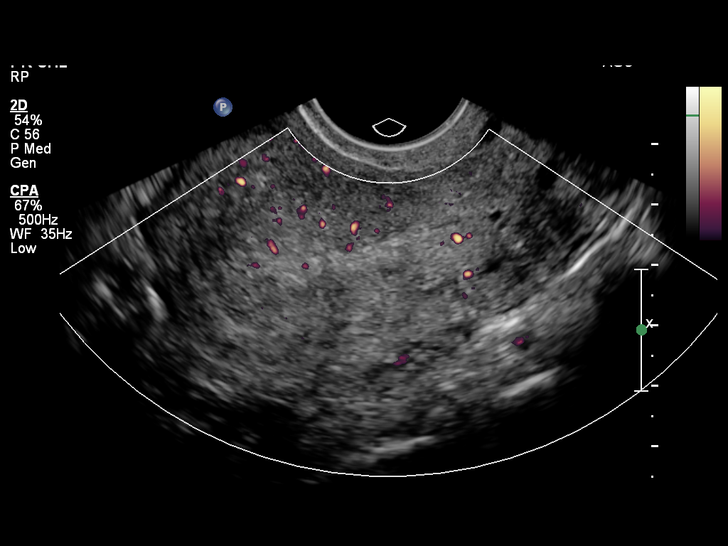
[im 22/29]
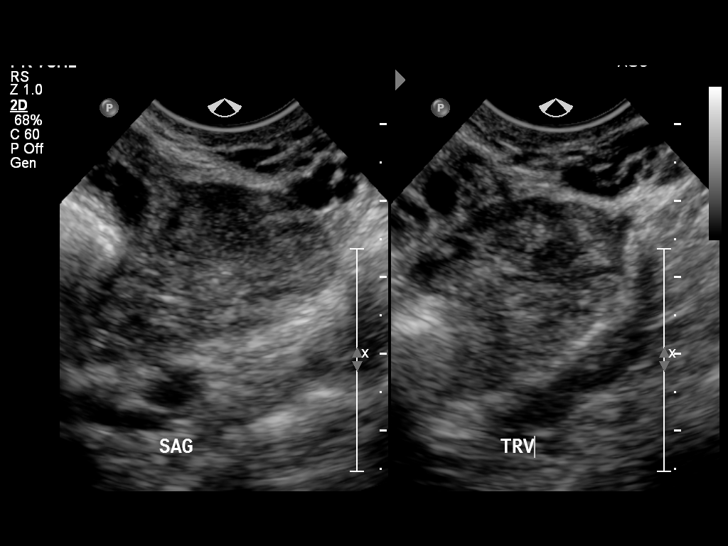
[im 24/29]
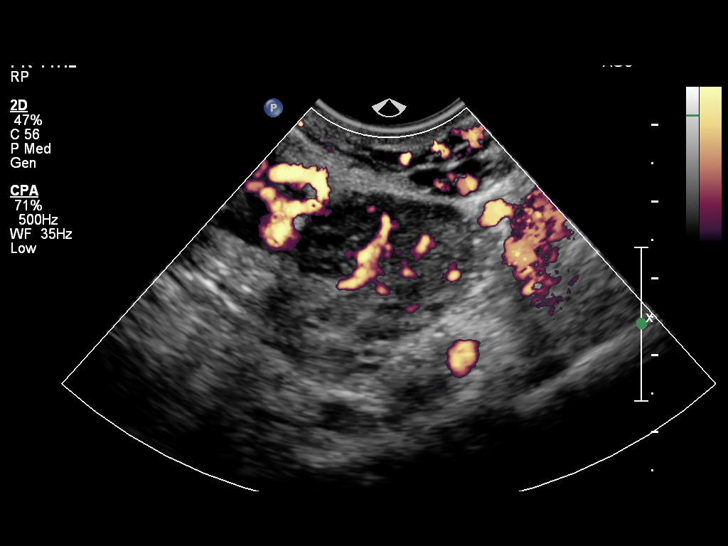
[im 26/29]
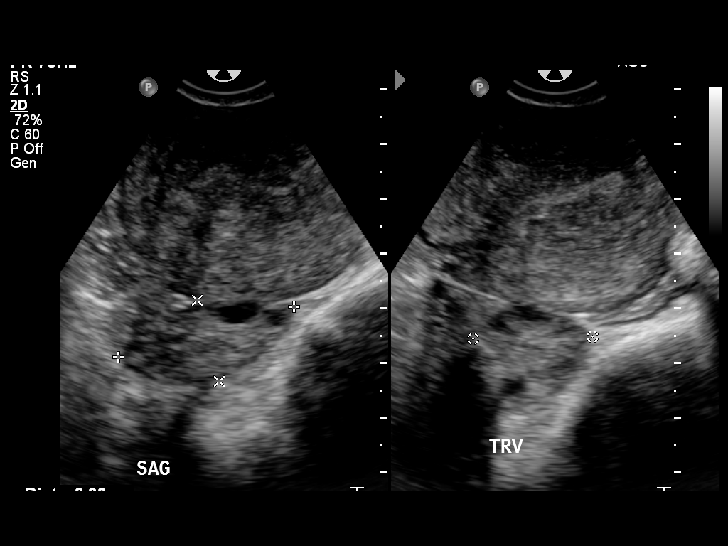
[im 29/29]
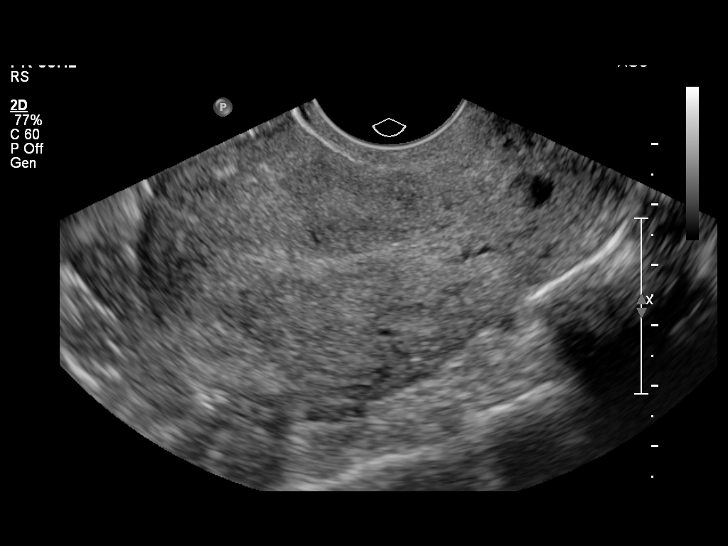

[13 of 25 positions shown; findings below may reference images not displayed]

FINDINGS: Uterus: The uterus is anteverted and measures about 8.4 x 4.2 x
cm.  Mild diffusely heterogeneous myometrial echotexture.  No focal
masses are demonstrated. Cysts in the cervical region consistent
with Nabothian cysts.

Endometrium: The margins of the endometrium are somewhat
indistinct.  Endometrial stripe thickness measures 9 mm.  No
endometrial fluid collections.

Right ovary:  The right ovary measures 3.3 x 1.5 x 2.2 cm.  Normal
follicular changes.  Flow is demonstrated in the right ovary on
color flow Doppler imaging.  No abnormal adnexal masses.

Left ovary: The left ovary measures 3.7 x 2.1 x 2.6 cm.  Normal
follicular changes.  Flow is demonstrated in the left ovary on
color flow Doppler imaging.  No abnormal adnexal masses.

Other findings: No free fluid
IMPRESSION: Mild diffusely heterogeneous myometrial echotexture.  Indistinct
margins of the endometrium.  Changes could represent adenomyosis.
Ovaries are unremarkable.

## 2013-12-15 ENCOUNTER — Encounter (HOSPITAL_COMMUNITY): Payer: Self-pay

## 2013-12-15 ENCOUNTER — Inpatient Hospital Stay (HOSPITAL_COMMUNITY)
Admission: AD | Admit: 2013-12-15 | Discharge: 2013-12-15 | Disposition: A | Discharge status: Home / Self Care | Payer: Medicaid Other | Source: Ambulatory Visit | Attending: Obstetrics & Gynecology | Admitting: Obstetrics & Gynecology

## 2013-12-15 DIAGNOSIS — O9989 Other specified diseases and conditions complicating pregnancy, childbirth and the puerperium: Principal | ICD-10-CM

## 2013-12-15 DIAGNOSIS — R1012 Left upper quadrant pain: Secondary | ICD-10-CM | POA: Insufficient documentation

## 2013-12-15 DIAGNOSIS — O99891 Other specified diseases and conditions complicating pregnancy: Secondary | ICD-10-CM | POA: Insufficient documentation

## 2013-12-15 LAB — URINALYSIS, ROUTINE W REFLEX MICROSCOPIC
Bilirubin Urine: NEGATIVE
Glucose, UA: NEGATIVE mg/dL
KETONES UR: NEGATIVE mg/dL
Nitrite: NEGATIVE
PROTEIN: NEGATIVE mg/dL
Specific Gravity, Urine: 1.025 (ref 1.005–1.030)
UROBILINOGEN UA: 0.2 mg/dL (ref 0.0–1.0)
pH: 6 (ref 5.0–8.0)

## 2013-12-15 LAB — COMPREHENSIVE METABOLIC PANEL
ALBUMIN: 3.4 g/dL — AB (ref 3.5–5.2)
ALT: 10 U/L (ref 0–35)
AST: 15 U/L (ref 0–37)
Alkaline Phosphatase: 62 U/L (ref 39–117)
BUN: 13 mg/dL (ref 6–23)
CALCIUM: 9.3 mg/dL (ref 8.4–10.5)
CO2: 26 mEq/L (ref 19–32)
CREATININE: 0.87 mg/dL (ref 0.50–1.10)
Chloride: 103 mEq/L (ref 96–112)
GFR calc Af Amer: >90 mL/min (ref >90)
GFR calc non Af Amer: 80 mL/min — ABNORMAL LOW (ref >90)
Glucose, Bld: 92 mg/dL (ref 70–99)
POTASSIUM: 3.9 meq/L (ref 3.7–5.3)
Sodium: 138 mEq/L (ref 137–147)
TOTAL PROTEIN: 6 g/dL (ref 6.0–8.3)
Total Bilirubin: 0.3 mg/dL (ref 0.3–1.2)

## 2013-12-15 LAB — CBC
HEMATOCRIT: 34.6 % — AB (ref 36.0–46.0)
Hemoglobin: 11.7 g/dL — ABNORMAL LOW (ref 12.0–15.0)
MCH: 31.9 pg (ref 26.0–34.0)
MCHC: 33.8 g/dL (ref 30.0–36.0)
MCV: 94.3 fL (ref 78.0–100.0)
Platelets: 212 10*3/uL (ref 150–400)
RBC: 3.67 MIL/uL — ABNORMAL LOW (ref 3.87–5.11)
RDW: 13.8 % (ref 11.5–15.5)
WBC: 13 10*3/uL — ABNORMAL HIGH (ref 4.0–10.5)

## 2013-12-15 LAB — URINE MICROSCOPIC-ADD ON

## 2013-12-15 LAB — POCT PREGNANCY, URINE: Preg Test, Ur: NEGATIVE

## 2013-12-15 MED ORDER — KETOROLAC TROMETHAMINE 60 MG/2ML IM SOLN
60.0000 mg | Freq: Once | INTRAMUSCULAR | Status: AC
  Administered 2013-12-15: 60 mg via INTRAMUSCULAR
  Filled 2013-12-15: qty 2

## 2013-12-15 MED ORDER — PROMETHAZINE HCL 25 MG PO TABS: 25.0000 mg | ORAL_TABLET | Freq: Four times a day (QID) | ORAL | Status: DC | PRN

## 2013-12-15 MED ORDER — KETOROLAC TROMETHAMINE 10 MG PO TABS: 10.0000 mg | ORAL_TABLET | Freq: Four times a day (QID) | ORAL | Status: DC | PRN

## 2013-12-15 MED ORDER — GI COCKTAIL ~~LOC~~
30.0000 mL | Freq: Once | ORAL | Status: AC
  Administered 2013-12-15: 30 mL via ORAL
  Filled 2013-12-15: qty 30

## 2013-12-15 NOTE — Discharge Instructions (Signed)

## 2013-12-15 NOTE — MAU Note (Signed)
Pt states she had a D & C and novasure procedure performed on 11-27-2013. States that yesterday morning she began experiencing upper and left abdominal pain and cramping.

## 2013-12-15 NOTE — MAU Provider Note (Signed)
History     CSN: [id number]  Arrival date and time: 12/15/13 0244   First Provider Initiated Contact with Patient 12/15/13 [phone]      Chief Complaint  Patient presents with  . Abdominal Pain   HPI Ms. [name] is a 43 y.o. [id number] who presents to MAU today with complaint of abdominal pain. The patient had a D&C and Novasure ablation on 11/27/13. She states that she was given Ibuprofen for pain at time of discharge. She called back later that night for additional pain medication and was given Rx for Tramadol which relieved her pain, but she ran out. The abdominal pain has returned since yesterday morning. She states that the pain comes and goes and at worst is rated at 8/10. She states the majority of her pain is in the upper abdomen. She has had a watery pink discharge since the procedure with a mild odor. She has occasional headaches, but none now. She denies fever, she has had some nausea without vomiting or reflux symptoms. She denies a change in appetite. Last ibuprofen taken at 2000 last night.   OB History   Grav Para Term Preterm Abortions TAB SAB Ect Mult Living   6 5 5  0 1 0 1 0 0 5      Past Medical History  Diagnosis Date  . Gastritis   . Irritable bowel syndrome   . Amniotic fluid embolism 2004    history 2004 delivery resolved  . Respiratory failure, acute 2004    hx 2004 w preg - resolved  . SVD (spontaneous vaginal delivery)     x 5  . Hypertension 2004    Hx PIH with 2004 preg - resolved  . Myocardial infarction 2004    DIC, hypertension during 2004 delivery, no problems since  . Hypothyroidism     hx - but resolved  . Depression     history - no meds  . GERD (gastroesophageal reflux disease)     diet controlled -no meds  . Chronic headaches     otc med prn  . Anemia     hx  . History of blood transfusion 2004    [facility]     Past Surgical History  Procedure Laterality Date  . Knee arthroscopy      Right knee  . Dilation and curettage of uterus     . Cardiac catheterization  2004    test normal - no blockages  . Tubal ligation    . Hysteroscopy with novasure N/A 11/27/2013    Procedure: HYSTEROSCOPY WITH NOVASURE;  Surgeon: Lavonia Drafts, MD;  Location: [facility];  Service: Gynecology;  Laterality: N/A;    Family History  Problem Relation Age of Onset  . Prostate cancer Paternal Uncle   . Stomach cancer Paternal Grandmother     History  Substance Use Topics  . Smoking status: Never Smoker   . Smokeless tobacco: Never Used  . Alcohol Use: Yes     Comment: occasional    Allergies: No Known Allergies  Prescriptions prior to admission  Medication Sig Dispense Refill  . ibuprofen (ADVIL,MOTRIN) 600 MG tablet Take 1 tablet (600 mg total) by mouth every 6 (six) hours as needed.  20 tablet  0  . minocycline (DYNACIN) 100 MG tablet Take 100 mg by mouth daily.      . Multiple Vitamin (MULTIVITAMIN WITH MINERALS) TABS Take 1 tablet by mouth daily.      . naproxen sodium (ANAPROX) 550 MG tablet Take  550 mg by mouth 2 (two) times daily with a meal.        Review of Systems  Constitutional: Negative for fever and malaise/fatigue.  Gastrointestinal: Positive for abdominal pain.  Genitourinary: Negative for dysuria, urgency and frequency.       + vaginal bleeding, discharge   Physical Exam   Blood pressure 122/76, pulse 83, temperature 99 F (37.2 C), temperature source Oral, resp. rate 18, height 5\' 2"  (1.575 m), weight 138 lb (62.596 kg), last menstrual period 11/03/2013, SpO2 100.00%.  Physical Exam  Constitutional: She is oriented to person, place, and time. She appears well-developed and well-nourished. No distress.  HENT:  Head: Normocephalic and atraumatic.  Cardiovascular: Normal rate, regular rhythm and normal heart sounds.   Respiratory: Effort normal and breath sounds normal. No respiratory distress.  GI: Soft. Bowel sounds are normal. She exhibits no distension and no mass. There is no tenderness. There is  no rebound and no guarding.  Genitourinary: Uterus is not enlarged and not tender. Cervix exhibits no motion tenderness, no discharge and no friability. Right adnexum displays no mass and no tenderness. Left adnexum displays no mass and no tenderness. Vaginal discharge (scant thin, pink discharge noted) found.  Neurological: She is alert and oriented to person, place, and time.  Skin: Skin is warm and dry. No erythema.  Psychiatric: She has a normal mood and affect.   Results for orders placed during the hospital encounter of 12/15/13 (from the past 24 hour(s))  URINALYSIS, ROUTINE W REFLEX MICROSCOPIC     Status: Abnormal   Collection Time    12/15/13  2:50 AM      Result Value Range   Color, Urine YELLOW  YELLOW   APPearance HAZY (*) CLEAR   Specific Gravity, Urine 1.025  1.005 - 1.030   pH 6.0  5.0 - 8.0   Glucose, UA NEGATIVE  NEGATIVE mg/dL   Hgb urine dipstick LARGE (*) NEGATIVE   Bilirubin Urine NEGATIVE  NEGATIVE   Ketones, ur NEGATIVE  NEGATIVE mg/dL   Protein, ur NEGATIVE  NEGATIVE mg/dL   Urobilinogen, UA 0.2  0.0 - 1.0 mg/dL   Nitrite NEGATIVE  NEGATIVE   Leukocytes, UA LARGE (*) NEGATIVE  URINE MICROSCOPIC-ADD ON     Status: Abnormal   Collection Time    12/15/13  2:50 AM      Result Value Range   Squamous Epithelial / LPF RARE  RARE   WBC, UA 3-6  <3 WBC/hpf   RBC / HPF 21-50  <3 RBC/hpf   Bacteria, UA FEW (*) RARE  POCT PREGNANCY, URINE     Status: None   Collection Time    12/15/13  3:05 AM      Result Value Range   Preg Test, Ur NEGATIVE  NEGATIVE  CBC     Status: Abnormal   Collection Time    12/15/13  4:05 AM      Result Value Range   WBC 13.0 (*) 4.0 - 10.5 K/uL   RBC 3.67 (*) 3.87 - 5.11 MIL/uL   Hemoglobin 11.7 (*) 12.0 - 15.0 g/dL   HCT 34.6 (*) 36.0 - 46.0 %   MCV 94.3  78.0 - 100.0 fL   MCH 31.9  26.0 - 34.0 pg   MCHC 33.8  30.0 - 36.0 g/dL   RDW 13.8  11.5 - 15.5 %   Platelets 212  150 - 400 K/uL  COMPREHENSIVE METABOLIC PANEL     Status:  Abnormal   Collection  Time    12/15/13  4:05 AM      Result Value Range   Sodium 138  137 - 147 mEq/L   Potassium 3.9  3.7 - 5.3 mEq/L   Chloride 103  96 - 112 mEq/L   CO2 26  19 - 32 mEq/L   Glucose, Bld 92  70 - 99 mg/dL   BUN 13  6 - 23 mg/dL   Creatinine, Ser 0.87  0.50 - 1.10 mg/dL   Calcium 9.3  8.4 - 10.5 mg/dL   Total Protein 6.0  6.0 - 8.3 g/dL   Albumin 3.4 (*) 3.5 - 5.2 g/dL   AST 15  0 - 37 U/L   ALT 10  0 - 35 U/L   Alkaline Phosphatase 62  39 - 117 U/L   Total Bilirubin 0.3  0.3 - 1.2 mg/dL   GFR calc non Af Amer 80 (*) >90 mL/min   GFR calc Af Amer >90  >90 mL/min    MAU Course  Procedures None  MDM UPT - negative UA, CBC, CMP today Discussed patient with Dr. Hulan Fray. Patient's pain is very unlikely related to the surgery or any GYN origin. Patient is stable, afebrile today.  Patient continues to have occasional pains, although appears improved from arrival in MAU Patient has a PCP and is encouraged to follow-up with PCP if symptoms continue.   Assessment and Plan  A: Abdominal pain, LUQ  P: Discharge home Rx for Phenergan and Toradol sent to patient's pharmacy Patient advised to follow-up with PCP if symptoms persist Patient may return to MAU as needed or if her condition were to change or worsen, however it was discussed that she should go to [facility] or [facility] for worsening of her upper abdominal pain.   Farris Has, PA-C  12/15/2013, 5:27 AM

## 2013-12-16 LAB — URINE CULTURE: Colony Count: 4000

## 2013-12-19 ENCOUNTER — Emergency Department (HOSPITAL_COMMUNITY)
Admission: EM | Admit: 2013-12-19 | Discharge: 2013-12-19 | Disposition: A | Discharge status: Home / Self Care | Payer: Medicaid Other | Attending: Emergency Medicine | Admitting: Emergency Medicine

## 2013-12-19 ENCOUNTER — Encounter (HOSPITAL_COMMUNITY): Payer: Self-pay | Admitting: Emergency Medicine

## 2013-12-19 ENCOUNTER — Emergency Department (HOSPITAL_COMMUNITY): Payer: Medicaid Other

## 2013-12-19 DIAGNOSIS — K219 Gastro-esophageal reflux disease without esophagitis: Secondary | ICD-10-CM | POA: Insufficient documentation

## 2013-12-19 DIAGNOSIS — K589 Irritable bowel syndrome without diarrhea: Secondary | ICD-10-CM | POA: Insufficient documentation

## 2013-12-19 DIAGNOSIS — I10 Essential (primary) hypertension: Secondary | ICD-10-CM | POA: Insufficient documentation

## 2013-12-19 DIAGNOSIS — N719 Inflammatory disease of uterus, unspecified: Secondary | ICD-10-CM

## 2013-12-19 DIAGNOSIS — F3289 Other specified depressive episodes: Secondary | ICD-10-CM | POA: Insufficient documentation

## 2013-12-19 DIAGNOSIS — E039 Hypothyroidism, unspecified: Secondary | ICD-10-CM | POA: Insufficient documentation

## 2013-12-19 DIAGNOSIS — F329 Major depressive disorder, single episode, unspecified: Secondary | ICD-10-CM | POA: Insufficient documentation

## 2013-12-19 DIAGNOSIS — R109 Unspecified abdominal pain: Secondary | ICD-10-CM | POA: Insufficient documentation

## 2013-12-19 DIAGNOSIS — I252 Old myocardial infarction: Secondary | ICD-10-CM | POA: Insufficient documentation

## 2013-12-19 LAB — CBC WITH DIFFERENTIAL/PLATELET
Basophils Absolute: 0 10*3/uL (ref 0.0–0.1)
Basophils Relative: 0 % (ref 0–1)
EOS PCT: 2 % (ref 0–5)
Eosinophils Absolute: 0.3 10*3/uL (ref 0.0–0.7)
HCT: 34.2 % — ABNORMAL LOW (ref 36.0–46.0)
HEMOGLOBIN: 11.8 g/dL — AB (ref 12.0–15.0)
LYMPHS ABS: 1.3 10*3/uL (ref 0.7–4.0)
LYMPHS PCT: 12 % (ref 12–46)
MCH: 32.5 pg (ref 26.0–34.0)
MCHC: 34.5 g/dL (ref 30.0–36.0)
MCV: 94.2 fL (ref 78.0–100.0)
Monocytes Absolute: 0.6 10*3/uL (ref 0.1–1.0)
Monocytes Relative: 5 % (ref 3–12)
NEUTROS PCT: 80 % — AB (ref 43–77)
Neutro Abs: 8.5 10*3/uL — ABNORMAL HIGH (ref 1.7–7.7)
Platelets: 328 10*3/uL (ref 150–400)
RBC: 3.63 MIL/uL — AB (ref 3.87–5.11)
RDW: 13.8 % (ref 11.5–15.5)
WBC: 10.6 10*3/uL — ABNORMAL HIGH (ref 4.0–10.5)

## 2013-12-19 LAB — COMPREHENSIVE METABOLIC PANEL
ALK PHOS: 72 U/L (ref 39–117)
ALT: 13 U/L (ref 0–35)
AST: 24 U/L (ref 0–37)
Albumin: 3.3 g/dL — ABNORMAL LOW (ref 3.5–5.2)
BUN: 11 mg/dL (ref 6–23)
CO2: 25 meq/L (ref 19–32)
Calcium: 9.4 mg/dL (ref 8.4–10.5)
Chloride: 102 mEq/L (ref 96–112)
Creatinine, Ser: 0.95 mg/dL (ref 0.50–1.10)
GFR, EST AFRICAN AMERICAN: 83 mL/min — AB (ref >90)
GFR, EST NON AFRICAN AMERICAN: 72 mL/min — AB (ref >90)
GLUCOSE: 79 mg/dL (ref 70–99)
POTASSIUM: 4 meq/L (ref 3.7–5.3)
Sodium: 137 mEq/L (ref 137–147)
Total Bilirubin: 0.3 mg/dL (ref 0.3–1.2)
Total Protein: 6.7 g/dL (ref 6.0–8.3)

## 2013-12-19 LAB — LIPASE, BLOOD: Lipase: 23 U/L (ref 11–59)

## 2013-12-19 MED ORDER — SODIUM CHLORIDE 0.9 % IV BOLUS (SEPSIS)
1000.0000 mL | Freq: Once | INTRAVENOUS | Status: AC
  Administered 2013-12-19: 1000 mL via INTRAVENOUS

## 2013-12-19 MED ORDER — OXYCODONE-ACETAMINOPHEN 5-325 MG PO TABS: 2.0000 | ORAL_TABLET | ORAL | Status: DC | PRN

## 2013-12-19 MED ORDER — HYDROMORPHONE HCL PF 1 MG/ML IJ SOLN
1.0000 mg | Freq: Once | INTRAMUSCULAR | Status: AC
  Administered 2013-12-19: 1 mg via INTRAVENOUS
  Filled 2013-12-19: qty 1

## 2013-12-19 MED ORDER — AMOXICILLIN-POT CLAVULANATE 875-125 MG PO TABS: 1.0000 | ORAL_TABLET | Freq: Two times a day (BID) | ORAL | Status: DC

## 2013-12-19 MED ORDER — IOHEXOL 300 MG/ML  SOLN
100.0000 mL | Freq: Once | INTRAMUSCULAR | Status: AC | PRN
  Administered 2013-12-19: 100 mL via INTRAVENOUS

## 2013-12-19 MED ORDER — METRONIDAZOLE 500 MG PO TABS: 500.0000 mg | ORAL_TABLET | Freq: Two times a day (BID) | ORAL | Status: DC

## 2013-12-19 MED ORDER — IOHEXOL 300 MG/ML  SOLN
50.0000 mL | Freq: Once | INTRAMUSCULAR | Status: AC | PRN
  Administered 2013-12-19: 50 mL via ORAL

## 2013-12-19 MED ORDER — SODIUM CHLORIDE 0.9 % IV SOLN: INTRAVENOUS | Status: DC

## 2013-12-19 MED ORDER — ONDANSETRON HCL 4 MG/2ML IJ SOLN
4.0000 mg | Freq: Once | INTRAMUSCULAR | Status: AC
  Administered 2013-12-19: 4 mg via INTRAVENOUS
  Filled 2013-12-19: qty 2

## 2013-12-19 NOTE — ED Notes (Addendum)
Patient assisted to bathroom via wheelchair.  Assisted back to bed, side rails up, call bell within reach.  Pt instructed to call for assist, agreed with plan.

## 2013-12-19 NOTE — ED Provider Notes (Signed)
CSN: [id number]     Arrival date & time 12/19/13  1714 History   First MD Initiated Contact with Patient 12/19/13 1747     Chief Complaint  Patient presents with  . Abdominal Pain   (Consider location/radiation/quality/duration/timing/severity/associated sxs/prior Treatment) Patient is a 43 y.o. female presenting with abdominal pain. The history is provided by the patient.  Abdominal Pain  patient here complaining of five-day history of left upper and left lower quadrant abdominal pain. Seen at [facility] hospital for similar symptoms and review the chart shows that she had a negative evaluation at that time. He has continued and characterized as sharp and persistent. No dysuria or hematuria. No vaginal bleeding or discharge. She was prescribed Toradol which helped her symptoms temporarily. No prior history of same. Symptoms are worse with walking.   Past Medical History  Diagnosis Date  . Gastritis   . Irritable bowel syndrome   . Amniotic fluid embolism 2004    history 2004 delivery resolved  . Respiratory failure, acute 2004    hx 2004 w preg - resolved  . SVD (spontaneous vaginal delivery)     x 5  . Hypertension 2004    Hx PIH with 2004 preg - resolved  . Myocardial infarction 2004    DIC, hypertension during 2004 delivery, no problems since  . Hypothyroidism     hx - but resolved  . Depression     history - no meds  . GERD (gastroesophageal reflux disease)     diet controlled -no meds  . Chronic headaches     otc med prn  . Anemia     hx  . History of blood transfusion 2004    [facility]    Past Surgical History  Procedure Laterality Date  . Knee arthroscopy      Right knee  . Dilation and curettage of uterus    . Cardiac catheterization  2004    test normal - no blockages  . Tubal ligation    . Hysteroscopy with novasure N/A 11/27/2013    Procedure: HYSTEROSCOPY WITH NOVASURE;  Surgeon: Lavonia Drafts, MD;  Location: [facility];  Service: Gynecology;  Laterality: N/A;     Family History  Problem Relation Age of Onset  . Prostate cancer Paternal Uncle   . Stomach cancer Paternal Grandmother    History  Substance Use Topics  . Smoking status: Never Smoker   . Smokeless tobacco: Never Used  . Alcohol Use: Yes     Comment: occasional   OB History   Grav Para Term Preterm Abortions TAB SAB Ect Mult Living   6 5 5  0 1 0 1 0 0 5     Review of Systems  Gastrointestinal: Positive for abdominal pain.  All other systems reviewed and are negative.    Allergies  Review of patient's allergies indicates no known allergies.  Home Medications   Current Outpatient Rx  Name  Route  Sig  Dispense  Refill  . ciprofloxacin (CIPRO) 500 MG tablet   Oral   Take 500 mg by mouth 2 (two) times daily.         Marland Kitchen ketorolac (TORADOL) 10 MG tablet   Oral   Take 1 tablet (10 mg total) by mouth every 6 (six) hours as needed.   10 tablet   0   . Multiple Vitamin (MULTIVITAMIN WITH MINERALS) TABS   Oral   Take 1 tablet by mouth daily.         . promethazine (PHENERGAN)  25 MG tablet   Oral   Take 1 tablet (25 mg total) by mouth every 6 (six) hours as needed for nausea or vomiting.   30 tablet   0    BP 145/78  Pulse 98  Temp(Src) 98.1 F (36.7 C) (Oral)  Resp 16  SpO2 99%  LMP 11/28/2013 Physical Exam  Nursing note and vitals reviewed. Constitutional: She is oriented to person, place, and time. She appears well-developed and well-nourished.  Non-toxic appearance. No distress.  HENT:  Head: Normocephalic and atraumatic.  Eyes: Conjunctivae, EOM and lids are normal. Pupils are equal, round, and reactive to light.  Neck: Normal range of motion. Neck supple. No tracheal deviation present. No mass present.  Cardiovascular: Normal rate, regular rhythm and normal heart sounds.  Exam reveals no gallop.   No murmur heard. Pulmonary/Chest: Effort normal and breath sounds normal. No stridor. No respiratory distress. She has no decreased breath sounds. She  has no wheezes. She has no rhonchi. She has no rales.  Abdominal: Soft. Normal appearance and bowel sounds are normal. She exhibits no distension. There is tenderness in the left lower quadrant. There is no rigidity, no rebound, no guarding and no CVA tenderness.    Musculoskeletal: Normal range of motion. She exhibits no edema and no tenderness.  Neurological: She is alert and oriented to person, place, and time. She has normal strength. No cranial nerve deficit or sensory deficit. GCS eye subscore is 4. GCS verbal subscore is 5. GCS motor subscore is 6.  Skin: Skin is warm and dry. No abrasion and no rash noted.  Psychiatric: She has a normal mood and affect. Her speech is normal and behavior is normal.    ED Course  Procedures (including critical care time) Labs Review Labs Reviewed  CBC WITH DIFFERENTIAL  COMPREHENSIVE METABOLIC PANEL  LIPASE, BLOOD   Imaging Review No results found.  EKG Interpretation   None       MDM  No diagnosis found. Patient given meds for pain and nausea here and does flow better. I spoke with her gynecologist and patient will be placed on antibiotics as well as given pain medication and an appointment has been scheduled for Monday at 12:45 PM    Leota Jacobsen, MD 12/19/13 2036

## 2013-12-19 NOTE — Discharge Instructions (Signed)
Endometritis °Endometritis is an irritation, soreness, and swelling (inflammation) of the lining of the uterus (endometrium).  °CAUSES  °· Bacterial infections. °· Sexually transmitted infections (STIs). °· Having a miscarriage or childbirth, especially after a long labor or cesarean delivery. °· Certain gynecological procedures (such as dilation and curettage, hysteroscopy, or contraceptive insertion). °SIGNS AND SYMPTOMS  °· Fever. °· Lower abdominal or pelvic pain. °· Abnormal vaginal discharge or bleeding. °· Abdominal bloating (distention) or swelling. °· General discomfort or ill feeling. °· Discomfort with bowel movements. °DIAGNOSIS  °A physical and pelvic exam are performed. Other tests may include: °· Cultures from the cervix. °· Blood tests. °· Examining a tissue sample of the uterine lining (endometrial biopsy). °· Examining discharge under a microscope (wet prep). °· Laparoscopy. °TREATMENT  °Antibiotic medicines are usually given. Other treatments may include: °· Fluids through an IV tube inserted in your vein. °· Rest. °HOME CARE INSTRUCTIONS  °· Take over-the-counter or prescription medicines for pain, discomfort, or fever as directed by your health care provider. °· Take your antibiotics as directed. Finish them even if you start to feel better. °· Resume your normal diet and activities as directed or as tolerated. °· Do not douche or have sexual intercourse until your health care provider says it is okay. °· Do not have sexual intercourse until your partner has been treated if your endometritis is caused by an STI. °SEEK IMMEDIATE MEDICAL CARE IF:  °· You have swelling or increasing pain in the abdomen. °· You have a fever. °· You have bad smelling vaginal discharge, or you have an increased amount of discharge. °· You have abnormal vaginal bleeding. °· Your medicine is not helping with the pain. °· You experience any problems that may be related to the medicine you are taking. °· You have nausea  and vomiting, or you cannot keep foods down. °· You have pain with bowel movements. °MAKE SURE YOU:  °· Understand these instructions. °· Will watch your condition. °· Will get help right away if you are not doing well or get worse. °Document Released: 11/20/2001 Document Revised: 07/29/2013 Document Reviewed: 06/25/2013 °[employer]® Patient Information ©2014 [employer]. ° °

## 2013-12-19 NOTE — ED Notes (Addendum)
Pt seen on Tuesday dx with "stomach bug" pain continues, also PMD called and told her she may have something in her blood. She has follow up appointment. Nausea without V/D. Pt khas D&C on Dec 16th due to heavy bleeding

## 2013-12-21 ENCOUNTER — Encounter: Payer: Self-pay | Admitting: Obstetrics & Gynecology

## 2013-12-21 ENCOUNTER — Ambulatory Visit (INDEPENDENT_AMBULATORY_CARE_PROVIDER_SITE_OTHER): Payer: Medicaid Other | Admitting: Obstetrics & Gynecology

## 2013-12-21 VITALS — BP 121/73 | HR 100 | Temp 98.1°F | Ht 62.0 in | Wt 143.3 lb

## 2013-12-21 DIAGNOSIS — N730 Acute parametritis and pelvic cellulitis: Secondary | ICD-10-CM

## 2013-12-21 MED ORDER — IBUPROFEN 800 MG PO TABS: 800.0000 mg | ORAL_TABLET | Freq: Three times a day (TID) | ORAL | Status: DC | PRN

## 2013-12-21 NOTE — Progress Notes (Signed)
Subjective:     Patient ID: Lise Auer, female   DOB: 07-24-1971, 43 y.o.   MRN: [id number]  HPI Pt s/p hysteroscopy with Endometrial ablation 11/27/13.  She presented to the ED 2x since the surgery with c/o pain.  2 days ago the imaging revealed infection of the endometrium.  She reports that she has less pain now that she has been on atbx.  She denies f/c/n/v     Review of Systems     Objective:   Physical Exam BP 121/73  Pulse 100  Temp(Src) 98.1 F (36.7 C) (Oral)  Ht 5\' 2"  (1.575 m)  Wt 143 lb 4.8 oz (65 kg)  BMI 26.20 kg/m2  LMP 11/28/2013 Pt in NAD Abd: soft, ND, sl tender. No rebound, no guarding    11/27/2013 Diagnosis Endometrium, curettage - SECRETORY ENDOMETRIUM, BENIGN ENDOCERVIX AND SCANT BENIGN MYOMETRIUM. - NO HYPERPLASIA OR CARCINOMA. Assessment:     Endometritis after endometrial ablation     Plan:     Keep flagyl and Augmentin F/u in 2 weeks F/u sooner prn Keep Percocet and Motrin prn pain

## 2013-12-21 NOTE — Patient Instructions (Signed)
Pelvic Inflammatory Disease °Pelvic inflammatory disease (PID) refers to an infection in some or all of the female organs. The infection can be in the uterus, ovaries, fallopian tubes, or the surrounding tissues in the pelvis. PID can cause abdominal or pelvic pain that comes on suddenly (acute pelvic pain). PID is a serious infection because it can lead to lasting (chronic) pelvic pain or the inability to have children (infertile).  °CAUSES  °The infection is often caused by the normal bacteria found in the vaginal tissues. PID may also be caused by an infection that is spread during sexual contact. PID can also occur following:  °· The birth of a baby.   °· A miscarriage.   °· An abortion.   °· Major pelvic surgery.   °· The use of an intrauterine device (IUD).   °· A sexual assault.   °RISK FACTORS °Certain factors can put a person at higher risk for PID, such as: °· Being younger than 25 years. °· Being sexually active at a young age. °· Using nonbarrier contraception. °· Having multiple sexual partners. °· Having sex with someone who has symptoms of a genital infection. °· Using oral contraception. °Other times, certain behaviors can increase the possibility of getting PID, such as: °· Having sex during your period. °· Using a vaginal douche. °· Having an intrauterine device (IUD) in place. °SYMPTOMS  °· Abdominal or pelvic pain.   °· Fever.   °· Chills.   °· Abnormal vaginal discharge. °· Abnormal uterine bleeding.   °· Unusual pain shortly after finishing your period. °DIAGNOSIS  °Your caregiver will choose some of the following methods to make a diagnosis, such as:  °· Performing a physical exam and history. A pelvic exam typically reveals a very tender uterus and surrounding pelvis.   °· Ordering laboratory tests including a pregnancy test, blood tests, and urine test.  °· Ordering cultures of the vagina and cervix to check for a sexually transmitted infection (STI). °· Performing an ultrasound.    °· Performing a laparoscopic procedure to look inside the pelvis.   °TREATMENT  °· Antibiotic medicines may be prescribed and taken by mouth.   °· Sexual partners may be treated when the infection is caused by a sexually transmitted disease (STD).   °· Hospitalization may be needed to give antibiotics intravenously. °· Surgery may be needed, but this is rare. °It may take weeks until you are completely well. If you are diagnosed with PID, you should also be checked for human immunodeficiency virus (HIV).   °HOME CARE INSTRUCTIONS  °· If given, take your antibiotics as directed. Finish the medicine even if you start to feel better.   °· Only take over-the-counter or prescription medicines for pain, discomfort, or fever as directed by your caregiver.   °· Do not have sexual intercourse until treatment is completed or as directed by your caregiver. If PID is confirmed, your recent sexual partner(s) will need treatment.   °· Keep your follow-up appointments. °SEEK MEDICAL CARE IF:  °· You have increased or abnormal vaginal discharge.   °· You need prescription medicine for your pain.   °· You vomit.   °· You cannot take your medicines.   °· Your partner has an STD.   °SEEK IMMEDIATE MEDICAL CARE IF:  °· You have a fever.   °· You have increased abdominal or pelvic pain.   °· You have chills.   °· You have pain when you urinate.   °· You are not better after 72 hours following treatment.   °MAKE SURE YOU:  °· Understand these instructions. °· Will watch your condition. °· Will get help right away if you are not doing well or get worse. °  pelvic pain.    · You have chills.    · You have pain when you urinate.    · You are not better after 72 hours following treatment.    MAKE SURE YOU:   · Understand these instructions.  · Will watch your condition.  · Will get help right away if you are not doing well or get worse.  Document Released: 11/26/2005 Document Revised: 03/23/2013 Document Reviewed: 11/22/2011  ExitCare® Patient Information ©2014 [employer].

## 2013-12-24 ENCOUNTER — Ambulatory Visit: Payer: Medicaid Other | Admitting: Obstetrics & Gynecology

## 2013-12-24 ENCOUNTER — Telehealth: Payer: Self-pay | Admitting: General Practice

## 2013-12-24 NOTE — Telephone Encounter (Signed)
Patient called and left message that she had surgery with Dr Ihor Dow back in December then came into the ER for a uterine infection and had a follow up appt here on Monday, but since Tuesday night she has had diarrhea and its got a little better but she is still having it and is just really weak and tired and doesn't know if she should go to the ER or not. Called patient, no answer- left message that we are trying to return your phone call, please call us back at the clinics.

## 2013-12-25 NOTE — Telephone Encounter (Signed)
Patient called and left message that she missed our phone call yesterday and is awaiting our call back.

## 2013-12-25 NOTE — Telephone Encounter (Addendum)
Had dr dove review patient's chart, who advised the patient stop the antibiotics and start taking align and to make sure she comes for her appt with [place] on Monday. Called patient stating I was returning her phone call and spoke with a doctor here in the clinic and that they advised to stop the antibiotics and to start taking the align probiotics. Also encouraged patient to drink lots of fluids as well as foods with natural probiotics like yogurt and that its really important to come for her appt on Monday and should she feel worse over the weekend, like severe pain, fever, or no improvement in diarrhea to come to the ER. Patient verbalized understanding to all and stated that she didn't have the $3 for her copay on Monday. Encouraged patient to search around the house for the money or see if she could borrow from a friend. If not call us Monday morning to see if we can work something out because its very important she not miss this appt. Patient verbalized understanding and had no further questions

## 2013-12-25 NOTE — Telephone Encounter (Signed)
Debbe left a message she is sorry she missed our call, but she can't stay out of the bathroom.

## 2013-12-28 ENCOUNTER — Ambulatory Visit (INDEPENDENT_AMBULATORY_CARE_PROVIDER_SITE_OTHER): Payer: Medicaid Other | Admitting: Obstetrics & Gynecology

## 2013-12-28 ENCOUNTER — Encounter: Payer: Self-pay | Admitting: Obstetrics & Gynecology

## 2013-12-28 VITALS — BP 125/86 | HR 80 | Temp 97.7°F | Ht 62.0 in | Wt 138.7 lb

## 2013-12-28 DIAGNOSIS — Z9889 Other specified postprocedural states: Secondary | ICD-10-CM

## 2013-12-28 DIAGNOSIS — N719 Inflammatory disease of uterus, unspecified: Secondary | ICD-10-CM

## 2013-12-28 NOTE — Patient Instructions (Signed)
Endometritis °Endometritis is an irritation, soreness, and swelling (inflammation) of the lining of the uterus (endometrium).  °CAUSES  °· Bacterial infections. °· Sexually transmitted infections (STIs). °· Having a miscarriage or childbirth, especially after a long labor or cesarean delivery. °· Certain gynecological procedures (such as dilation and curettage, hysteroscopy, or contraceptive insertion). °SIGNS AND SYMPTOMS  °· Fever. °· Lower abdominal or pelvic pain. °· Abnormal vaginal discharge or bleeding. °· Abdominal bloating (distention) or swelling. °· General discomfort or ill feeling. °· Discomfort with bowel movements. °DIAGNOSIS  °A physical and pelvic exam are performed. Other tests may include: °· Cultures from the cervix. °· Blood tests. °· Examining a tissue sample of the uterine lining (endometrial biopsy). °· Examining discharge under a microscope (wet prep). °· Laparoscopy. °TREATMENT  °Antibiotic medicines are usually given. Other treatments may include: °· Fluids through an IV tube inserted in your vein. °· Rest. °HOME CARE INSTRUCTIONS  °· Take over-the-counter or prescription medicines for pain, discomfort, or fever as directed by your health care provider. °· Take your antibiotics as directed. Finish them even if you start to feel better. °· Resume your normal diet and activities as directed or as tolerated. °· Do not douche or have sexual intercourse until your health care provider says it is okay. °· Do not have sexual intercourse until your partner has been treated if your endometritis is caused by an STI. °SEEK IMMEDIATE MEDICAL CARE IF:  °· You have swelling or increasing pain in the abdomen. °· You have a fever. °· You have bad smelling vaginal discharge, or you have an increased amount of discharge. °· You have abnormal vaginal bleeding. °· Your medicine is not helping with the pain. °· You experience any problems that may be related to the medicine you are taking. °· You have nausea  and vomiting, or you cannot keep foods down. °· You have pain with bowel movements. °MAKE SURE YOU:  °· Understand these instructions. °· Will watch your condition. °· Will get help right away if you are not doing well or get worse. °Document Released: 11/20/2001 Document Revised: 07/29/2013 Document Reviewed: 06/25/2013 °ExitCare® Patient Information ©2014 [employer]. ° °

## 2013-12-28 NOTE — Progress Notes (Signed)
Subjective:     Patient ID: [name], female   DOB: 1971/12/09, 43 y.o.   MRN: [id number]  HPI Pt s/p Hysteroscopy with endometrium ablation suing [employer] on 11/27/2013.  There procedure was complicated with endometritis 12/15/2013.  She was placed on Augmentin and flagyl.  She called 1 week later with c/o diarrhea and was instructed to stop the atbx.  She reports that she is still having the diarrhea but, it has improved.  She reports that her pain is improved but, not completely resolved.  She is spotting mildly but, has no heavy bleeding. She denies f/v/n/c  Reports problems at home that she did not want to discuss.  Reports that she does not think that she is depressed.   Review of Systems     Objective:   Physical Exam BP 125/86  Pulse 80  Temp(Src) 97.7 F (36.5 C) (Oral)  Ht 5\' 2"  (1.575 m)  Wt 138 lb 11.2 oz (62.914 kg)  BMI 25.36 kg/m2  LMP 11/28/2013 Pt in NAD.  Looks sad. Abd: soft, NT, ND GU: not done 11/27/2013 Diagnosis Endometrium, curettage - SECRETORY ENDOMETRIUM, BENIGN ENDOCERVIX AND SCANT BENIGN MYOMETRIUM. - NO HYPERPLASIA OR CARCINOMA     Assessment:     Post op check Post op endometritis Diarrhea- suspect due to Augmentin     Plan:     F/u 4 weeks or sooner prn if diarrhea continues Keep flagyl No Augmentin Reviewed pathology

## 2014-01-25 ENCOUNTER — Ambulatory Visit: Payer: Medicaid Other | Admitting: Obstetrics & Gynecology

## 2014-02-01 ENCOUNTER — Ambulatory Visit: Payer: Medicaid Other | Admitting: Obstetrics & Gynecology

## 2014-02-01 NOTE — Progress Notes (Signed)
Attempted to contact to verifiy on reasoning for missing appt.  Unable to leave message due to message stating "the number you are trying to reach is not a working number".

## 2014-04-20 ENCOUNTER — Ambulatory Visit
Admission: RE | Admit: 2014-04-20 | Discharge: 2014-04-20 | Disposition: A | Discharge status: Home / Self Care | Payer: Medicaid Other | Source: Ambulatory Visit | Attending: Nurse Practitioner | Admitting: Nurse Practitioner

## 2014-04-20 ENCOUNTER — Other Ambulatory Visit: Payer: Self-pay | Admitting: Nurse Practitioner

## 2014-04-20 DIAGNOSIS — R52 Pain, unspecified: Secondary | ICD-10-CM

## 2014-09-23 ENCOUNTER — Telehealth: Payer: Self-pay | Admitting: *Deleted

## 2014-09-23 NOTE — Telephone Encounter (Signed)
Pt called nurse line and states she is bleeding.  She is concerned because she had an ablation a year ago and has not bleed until now.  Pt has an appointment on 10/15/14.  Contacted patient and discussed bleeding.  Pt states the bleeding is not heavy. Discussed with Florencia Reasons who states if bleeding is light it is ok to wait until appointment on 10/15/2014.  Educated patient on signs/symptoms of when to come to [facility] for emergency.

## 2014-10-11 ENCOUNTER — Encounter: Payer: Self-pay | Admitting: Obstetrics & Gynecology

## 2014-10-15 ENCOUNTER — Ambulatory Visit (INDEPENDENT_AMBULATORY_CARE_PROVIDER_SITE_OTHER): Payer: Medicaid Other | Admitting: Obstetrics & Gynecology

## 2014-10-15 ENCOUNTER — Encounter: Payer: Self-pay | Admitting: Obstetrics & Gynecology

## 2014-10-15 VITALS — BP 117/70 | HR 90 | Ht 62.0 in | Wt 144.3 lb

## 2014-10-15 DIAGNOSIS — N921 Excessive and frequent menstruation with irregular cycle: Secondary | ICD-10-CM

## 2014-10-15 DIAGNOSIS — N760 Acute vaginitis: Secondary | ICD-10-CM

## 2014-10-15 MED ORDER — FLUCONAZOLE 150 MG PO TABS: 150.0000 mg | ORAL_TABLET | Freq: Once | ORAL | Status: DC

## 2014-10-15 MED ORDER — MEDROXYPROGESTERONE ACETATE 10 MG PO TABS: 10.0000 mg | ORAL_TABLET | Freq: Every day | ORAL | Status: DC

## 2014-10-15 NOTE — Progress Notes (Signed)
Subjective:     Patient ID: [name], female   DOB: 1971-08-12, 43 y.o.   MRN: [id number]  HPI  Abnormal Uterine Bleeding: - Chronic hx AUB s/p endometrial ablation and D&C about 1 year ago due to heavy bleeding, had good results without any further bleeding until recently. Reports now having breakthrough bleeding for past 2 months, described as reduced amount (less than prior menstrual cycle) for 1 week at a time, then will go 1-3 weeks without any bleeding. - No regular LMP, last bleeding episode 1 week ago - Admits to associated cramping and fatigue - Denies any recent illness, fevers/chills, abdominal pain, nausea / vomiting  Vaginal itching: - Reports hx vaginal itching for about 1 month, concerned she may have a yeast infection, similar symptoms to previous. States she does not douche, does use some scented soaps to clean vaginal area. - Denies any significant vaginal discharge, rash, dysuria, hematuria  PMH: - Prior MI during pregnancy  Social Hx: - Never smoker  Review of Systems  See above HPI    Objective:   Physical Exam BP 117/70 mmHg  Pulse 90  Ht 5\' 2"  (1.575 m)  Wt 144 lb 4.8 oz (65.454 kg)  BMI 26.39 kg/m2  LMP   Gen - well-appearing, NAD HEENT - MMM Abd - soft, NTND Ext - non-tender, no edema Skin - warm, dry Pelvic Exam - Normal external female genitalia. Vaginal canal without lesions. Normal appearing cervix, without lesions or bleeding. Scant discharge on exam, overall significantly dry vaginal canal.  Exam chaperoned by RN.    Assessment:     43 yr pre-menopausal F with PMH AUB s/p endometrial ablation and D&C about 1 year ago, now presenting with breakthrough bleeding, also presents with vaginal itching consistent with yeast infection.NOT a candidate for estrogen OCPs to control bleeding due to hx MI in pregnancy!     Plan:     1. Provera 10mg  daily x 3 months -  2. Diflucan 150mg  PO for presumed vaginal yeast infection 3. Recommend  avoid scented soaps, liners, avoid excessive dry vaginal environment 4. RTC 3 months re-evaluation      Nobie Putnam, [place], PGY-2  Attestation of Attending Supervision of Resident: Evaluation and management procedures were performed by the [facility] Resident under my supervision.  I have seen and examined the patient, reviewed the resident's note and chart, and I agree with the management and plan.  Lasandra Beech, M.D. 10/15/2014 9:07 AM

## 2014-10-15 NOTE — Addendum Note (Signed)
Addended by: Shelly Coss on: 10/15/2014 09:10 AM   Modules accepted: Orders

## 2014-10-15 NOTE — Patient Instructions (Signed)
Dysfunctional Uterine Bleeding Normally, menstrual periods begin between ages 11 to 17 in young women. A normal menstrual cycle/period may begin every 23 days up to 35 days and lasts from 1 to 7 days. Around 12 to 14 days before your menstrual period starts, ovulation (ovary produces an egg) occurs. When counting the time between menstrual periods, count from the first day of bleeding of the previous period to the first day of bleeding of the next period. Dysfunctional (abnormal) uterine bleeding is bleeding that is different from a normal menstrual period. Your periods may come earlier or later than usual. They may be lighter, have blood clots or be heavier. You may have bleeding between periods, or you may skip one period or more. You may have bleeding after sexual intercourse, bleeding after menopause, or no menstrual period. CAUSES   Pregnancy (normal, miscarriage, tubal).  IUDs (intrauterine device, birth control).  Birth control pills.  Hormone treatment.  Menopause.  Infection of the cervix.  Blood clotting problems.  Infection of the inside lining of the uterus.  Endometriosis, inside lining of the uterus growing in the pelvis and other female organs.  Adhesions (scar tissue) inside the uterus.  Obesity or severe weight loss.  Uterine polyps inside the uterus.  Cancer of the vagina, cervix, or uterus.  Ovarian cysts or polycystic ovary syndrome.  Medical problems (diabetes, thyroid disease).  Uterine fibroids (noncancerous tumor).  Problems with your female hormones.  Endometrial hyperplasia, very thick lining and enlarged cells inside of the uterus.  Medicines that interfere with ovulation.  Radiation to the pelvis or abdomen.  Chemotherapy. DIAGNOSIS   Your doctor will discuss the history of your menstrual periods, medicines you are taking, changes in your weight, stress in your life, and any medical problems you may have.  Your doctor will do a physical  and pelvic examination.  Your doctor may want to perform certain tests to make a diagnosis, such as:  Pap test.  Blood tests.  Cultures for infection.  CT scan.  Ultrasound.  Hysteroscopy.  Laparoscopy.  MRI.  Hysterosalpingography.  D and C.  Endometrial biopsy. TREATMENT  Treatment will depend on the cause of the dysfunctional uterine bleeding (DUB). Treatment may include:  Observing your menstrual periods for a couple of months.  Prescribing medicines for medical problems, including:  Antibiotics.  Hormones.  Birth control pills.  Removing an IUD (intrauterine device, birth control).  Surgery:  D and C (scrape and remove tissue from inside the uterus).  Laparoscopy (examine inside the abdomen with a lighted tube).  Uterine ablation (destroy lining of the uterus with electrical current, laser, heat, or freezing).  Hysteroscopy (examine cervix and uterus with a lighted tube).  Hysterectomy (remove the uterus). HOME CARE INSTRUCTIONS   If medicines were prescribed, take exactly as directed. Do not change or switch medicines without consulting your caregiver.  Long term heavy bleeding may result in iron deficiency. Your caregiver may have prescribed iron pills. They help replace the iron that your body lost from heavy bleeding. Take exactly as directed.  Do not take aspirin or medicines that contain aspirin one week before or during your menstrual period. Aspirin may make the bleeding worse.  If you need to change your sanitary pad or tampon more than once every 2 hours, stay in bed with your feet elevated and a cold pack on your lower abdomen. Rest as much as possible, until the bleeding stops or slows down.  Eat well-balanced meals. Eat foods high in iron. Examples   are:  Leafy green vegetables.  Whole-grain breads and cereals.  Eggs.  Meat.  Liver.  Do not try to lose weight until the abnormal bleeding has stopped and your blood iron level is  back to normal. Do not lift more than ten pounds or do strenuous activities when you are bleeding.  For a couple of months, make note on your calendar, marking the start and ending of your period, and the type of bleeding (light, medium, heavy, spotting, clots or missed periods). This is for your caregiver to better evaluate your problem. SEEK MEDICAL CARE IF:   You develop nausea (feeling sick to your stomach) and vomiting, dizziness, or diarrhea while you are taking your medicine.  You are getting lightheaded or weak.  You have any problems that may be related to the medicine you are taking.  You develop pain with your DUB.  You want to remove your IUD.  You want to stop or change your birth control pills or hormones.  You have any type of abnormal bleeding mentioned above.  You are over 16 years old and have not had a menstrual period yet.  You are 43 years old and you are still having menstrual periods.  You have any of the symptoms mentioned above.  You develop a rash. SEEK IMMEDIATE MEDICAL CARE IF:   An oral temperature above 102 F (38.9 C) develops.  You develop chills.  You are changing your sanitary pad or tampon more than once an hour.  You develop abdominal pain.  You pass out or faint. Document Released: 11/23/2000 Document Revised: 02/18/2012 Document Reviewed: 10/25/2009 ExitCare Patient Information 2015 [employer]. This information is not intended to replace advice given to you by your health care provider. Make sure you discuss any questions you have with your health care provider.  

## 2014-10-16 LAB — WET PREP, GENITAL
Clue Cells Wet Prep HPF POC: NONE SEEN
TRICH WET PREP: NONE SEEN
WBC WET PREP: NONE SEEN
YEAST WET PREP: NONE SEEN

## 2014-11-10 ENCOUNTER — Telehealth: Payer: Self-pay | Admitting: *Deleted

## 2014-11-10 NOTE — Telephone Encounter (Signed)
[name] called and left a message that she recently had an appointment for bleeding; but states now she has started having pain around her vagina " in the bone" that she has never had before and isn't sure where it is coming from.Describes it as constant aching and wants a call.  Per chart was seen by [name] 10/15/14.  [name] and she states this pain started about a week after her office visit.  States she takes advil and it helps, but then pain comes back. States she is able to have intercourse and it does not make the pain worse. Denies any vaginal discharge .  We discussed per her visit she was prescribed provera- she states she is not taking this because of the possible side effects. WE also discussed due to her vaginal dryness is she using any lubricant- which she states she is not.  We discussed she could consider those options. We discussed this is not something diagnosed over the phone and since it is not emergency to be seen will have registars to call her with first available appointment with Dr. Maylene Roes Tamala Julian- already per notes was supposed to have a 3 month appointment- but no appointment has been made yet.

## 2014-12-23 ENCOUNTER — Ambulatory Visit (INDEPENDENT_AMBULATORY_CARE_PROVIDER_SITE_OTHER): Payer: Medicaid Other | Admitting: Obstetrics & Gynecology

## 2014-12-23 ENCOUNTER — Encounter: Payer: Self-pay | Admitting: Obstetrics & Gynecology

## 2014-12-23 VITALS — BP 136/85 | HR 69 | Ht 62.0 in | Wt 146.8 lb

## 2014-12-23 DIAGNOSIS — N938 Other specified abnormal uterine and vaginal bleeding: Secondary | ICD-10-CM

## 2014-12-23 DIAGNOSIS — A499 Bacterial infection, unspecified: Secondary | ICD-10-CM

## 2014-12-23 DIAGNOSIS — B9689 Other specified bacterial agents as the cause of diseases classified elsewhere: Secondary | ICD-10-CM

## 2014-12-23 DIAGNOSIS — N760 Acute vaginitis: Secondary | ICD-10-CM

## 2014-12-23 MED ORDER — NORETHIN ACE-ETH ESTRAD-FE 1-20 MG-MCG(24) PO TABS: 1.0000 | ORAL_TABLET | Freq: Every day | ORAL | Status: DC

## 2014-12-23 MED ORDER — METRONIDAZOLE 500 MG PO TABS
500.0000 mg | ORAL_TABLET | Freq: Two times a day (BID) | ORAL | Status: AC
Start: 2014-12-23 — End: 2014-12-30

## 2014-12-23 NOTE — Progress Notes (Signed)
Subjective:     Patient ID: [name], female   DOB: 1971-04-24, 44 y.o.   MRN: [id number]  HPI Pt presents for f/u of DUB.  She reports that she did not take the Provera because of fear of side effects.  She would consider low dose OCP's instead.  She reports that her bleeding stopped last night. She c/o fishy odor with her bleeding.   Review of Systems     Objective:   Physical Exam BP 136/85 mmHg  Pulse 69  Ht 5\' 2"  (1.575 m)  Wt 146 lb 12.8 oz (66.588 kg)  BMI 26.84 kg/m2 Pt in NAD Exam deferred      Assessment:     DUB- pt did not take Provera.  If sx not improved with OCP's pt will opt for [facility].       Plan:     LoEstrin 1 po q day F/u in 3 months Flagyl 500mg  bid x 7 days

## 2014-12-23 NOTE — Progress Notes (Signed)
Pt never took provera.

## 2014-12-23 NOTE — Patient Instructions (Signed)

## 2015-03-03 ENCOUNTER — Ambulatory Visit: Payer: Medicaid Other

## 2016-01-13 ENCOUNTER — Ambulatory Visit (INDEPENDENT_AMBULATORY_CARE_PROVIDER_SITE_OTHER): Payer: Self-pay | Admitting: Internal Medicine

## 2016-01-13 ENCOUNTER — Encounter: Payer: Self-pay | Admitting: Internal Medicine

## 2016-01-13 VITALS — BP 126/80 | HR 74 | Temp 97.8°F | Resp 19 | Ht 62.0 in | Wt 161.0 lb

## 2016-01-13 DIAGNOSIS — F339 Major depressive disorder, recurrent, unspecified: Secondary | ICD-10-CM

## 2016-01-13 DIAGNOSIS — N760 Acute vaginitis: Secondary | ICD-10-CM

## 2016-01-13 DIAGNOSIS — B9689 Other specified bacterial agents as the cause of diseases classified elsewhere: Secondary | ICD-10-CM

## 2016-01-13 DIAGNOSIS — A499 Bacterial infection, unspecified: Secondary | ICD-10-CM

## 2016-01-13 DIAGNOSIS — J302 Other seasonal allergic rhinitis: Secondary | ICD-10-CM

## 2016-01-13 MED ORDER — FEXOFENADINE HCL 180 MG PO TABS: 180.0000 mg | ORAL_TABLET | Freq: Every day | ORAL | Status: DC

## 2016-01-13 MED ORDER — FLUOXETINE HCL 10 MG PO TABS: 10.0000 mg | ORAL_TABLET | Freq: Every day | ORAL | Status: DC

## 2016-01-13 MED ORDER — TRAZODONE HCL 50 MG PO TABS: 25.0000 mg | ORAL_TABLET | Freq: Every evening | ORAL | Status: DC | PRN

## 2016-01-13 MED ORDER — METRONIDAZOLE 500 MG PO TABS: 500.0000 mg | ORAL_TABLET | Freq: Two times a day (BID) | ORAL | Status: DC

## 2016-01-13 NOTE — Progress Notes (Signed)
   Subjective:    Patient ID: [name], female    DOB: 1971/03/30, 45 y.o.   MRN: [id number]  HPI  1.  URI symptoms starting in the beginning of December.  Nasal congestion, runny nose, cough and chills.  Difficult to get history.  Still with stuffy, runny nose, headache, blood mixed in with clear nasal discharge.  Does have drainage down back of throat with scratchiness of throat and hoarseness.   Still occasionally feels chilled, but no definite fever.   Has been taking Mucinex, Ibuprofen, Tylenol, Nyquil, Theraflu.  Mucinex, Theraflu and Nyquil helped control symptoms, but when stops, symptoms return.   Lot of sneezing.  Eyes and nose watery, but no itching.  No throat itching.  Left ear feels stuffy from time to time. Does have history of seasonal allergies.  Last took Cetirizine for that.  Has not taken anything besides Cetirizine for allergies.  No history of nasal corticosteroids.  2.  Vaginal discharge for 2 weeks.  Clear to pink.  Mild itching. Slight odor, not fishy. Had a D & C and Novasure in 2013/2014.  Still with uterus.  No birth control currently.  Is sexually active with one partner.  She is not sure if her female partner is monogamous.  Have been together for 1 year.  Has had yeast infection, BV, as well as chlamydia and trichomonas in the past.  The STDs were many years ago.  No pelvic pain.  3.  Depression:  Difficulty with postpartum depression with last two pregnancies (these are also children of the husband killed last August.)  Problems with worsening depression since husband murdered in August 2016 Sleeps on sofa as lots of remembrances of him in her bedroom. Cries a lot when children not in home.  Rarely gets out of pjs.   Is isolating herself in home. Has difficulties falling asleep Was getting counseling though [employer], but counselor moved about a month ago. Was placed on Bupropion beginning of January, but is not taking regularly--maybe every other day  tops.  Cannot get any longer from [facility].  She feels this is keeping her up at night.  Takes in the morning.   Denies suicidal ideation  Meds: 1.  Bupropion XL 150 mg daily 2   Multivitamins 3.  Ibuprofen prn   NKDA    Review of Systems     Objective:   Physical Exam  NAD HEENT:  PERRL, EOMI, mild coonjunctival injection, TMs pearly gray, Nasal mucosa boggy with clear discharge.  Mild cobbling, posterior pharynx. Neck:  Supple, no adenopathy, no thyromegaly Chest:  CTA CV:  RRR without murmur or rub, radial pulses normal and equal Abd:  S, NT, No HSM or masses, +BS throughout GU:  Normal external genitalia, cream colored vaginal discharge, No cervical or vaginal mucosal inflammation or lesion.  No CMT or uterine, adnexal mass or tenderness.        Assessment & Plan:  1.  Allergies:  Discussed ways to keep dust down in house, bedding.  Start with Fexofenadine 180 mg daily.  Pt. Would like to wait on the nasal corticosteroids for now.  2.  Bacterial Vaginosis:  Metronidazole 500 mg twice daily for 7 days.  3.  Major Depression.  Unable to obtain Bupropion for patient.  Switch to Fluoxetine.  Trazodone for sleep at bedtime 25-50 mg. Follow up in 1 week.  Warm hand off to [employer].

## 2016-01-13 NOTE — Patient Instructions (Signed)
Take Trazodone daily at bedtime, not just as needed Stop Bupropion Take Fluoxetine in the morning. Do not drink alcohol while taking Metronidazole or for 48 hours after completing treatment

## 2016-01-20 ENCOUNTER — Ambulatory Visit (INDEPENDENT_AMBULATORY_CARE_PROVIDER_SITE_OTHER): Payer: Medicaid Other | Admitting: Licensed Clinical Social Worker

## 2016-01-20 DIAGNOSIS — F331 Major depressive disorder, recurrent, moderate: Secondary | ICD-10-CM
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
## 2016-02-03 ENCOUNTER — Ambulatory Visit (INDEPENDENT_AMBULATORY_CARE_PROVIDER_SITE_OTHER): Payer: Medicaid Other | Admitting: Licensed Clinical Social Worker

## 2016-02-03 DIAGNOSIS — F331 Major depressive disorder, recurrent, moderate: Secondary | ICD-10-CM

## 2016-02-06 LAB — POCT WET PREP WITH KOH
Clue Cells Wet Prep HPF POC: POSITIVE
KOH Prep POC: NEGATIVE
RBC WET PREP PER HPF POC: NEGATIVE
Trichomonas, UA: NEGATIVE
Yeast Wet Prep HPF POC: NEGATIVE

## 2016-02-06 NOTE — Progress Notes (Signed)
   THERAPY PROGRESS NOTE  Session Time: 59mn  Participation Level: Active  Behavioral Response: Neat and Well GroomedAlertDepressed  Type of Therapy: Individual Therapy  Treatment Goals addressed: Coping  Interventions: Supportive  Summary: Victoria GEERSis a 45y.o. female who presents with a depressed mood and appropriate affect. She reported that she continues to struggle with overwhelming grief following the death of her husband. She reported that the loss of appetite caused by her antidepressant was improving, although she was still finding it difficult to eat more than one meal a day. SKatianneappeared receptive to LCHS Incfeedback about the importance of taking care of herself physically while grieving. She shared some of her favorite memories of her husband. She shared about how she met her husband and how they fell in love. She shared about his personality and parenting style. SWhitneyexpressed her regrets at having separated from her husband and for saying mean things to him while they were separated. She shared about what she would have liked to tell him if she could have the chance. She became tearful while sharing about her husband. She reported that talking about it has started to help her. She reported that she has not drunk any alcohol since the last session. SLyndalreported that she is not using many coping skills, as she spends most of her time alone at home watching tv. She committed to leaving the house more regularly and trying to get some exercise. She reported that she would also begin singing again to release emotion.  Suicidal/Homicidal: Nowithout intent/plan  Therapist Response: LCSW utilized supportive counseling techniques throughout the session in order to validate emotions and encourage open expression of emotion. LCSW asked SMahamto share some of her favorite memories of her husband. LCSW checked in regarding what coping skills SBeckeywas using. LCSW emphasized  that [name] to engage in self-care during this time. LCSW encouraged her to take walks outside, get sunshine, and reach out to friends.  Plan: Return again in 3 weeks.  Diagnosis: Axis I: Major Depression, Recurrent severe    Axis II: No diagnosis    NMetta Clines LCSW 02/06/2016

## 2016-02-07 ENCOUNTER — Encounter: Payer: Self-pay | Admitting: Internal Medicine

## 2016-02-07 ENCOUNTER — Ambulatory Visit (INDEPENDENT_AMBULATORY_CARE_PROVIDER_SITE_OTHER): Payer: Medicaid Other | Admitting: Internal Medicine

## 2016-02-07 VITALS — BP 122/78 | HR 70 | Ht 62.0 in | Wt 162.0 lb

## 2016-02-07 DIAGNOSIS — J3089 Other allergic rhinitis: Secondary | ICD-10-CM

## 2016-02-07 DIAGNOSIS — F339 Major depressive disorder, recurrent, unspecified: Secondary | ICD-10-CM

## 2016-02-07 DIAGNOSIS — Z23 Encounter for immunization: Secondary | ICD-10-CM

## 2016-02-07 DIAGNOSIS — R5383 Other fatigue: Secondary | ICD-10-CM

## 2016-02-07 MED ORDER — DESLORATADINE 5 MG PO TABS: 5.0000 mg | ORAL_TABLET | Freq: Every day | ORAL | Status: DC

## 2016-02-07 MED ORDER — MOMETASONE FUROATE 50 MCG/ACT NA SUSP: NASAL | Status: DC

## 2016-02-07 MED ORDER — FLUOXETINE HCL 10 MG PO TABS: 10.0000 mg | ORAL_TABLET | Freq: Every day | ORAL | Status: DC

## 2016-02-07 NOTE — Progress Notes (Signed)
   Subjective:    Patient ID: [name], female    DOB: [birth year], 45 y.o.   MRN: [id number]  HPI  1.  Depression and grief:  Sleeping better with Trazodone.  Is getting at least 6 hours of sleep at night.  Maybe 3 times out of the week, feels refreshed when awakens in the morning. Also taking the Fluoxetine in the morning and notes she is not as tearful with this. Still with lack of energy.  States back in 2005 diagnosed with what sounds like hypothyroidism.  States she was on medicine for this.  Did not take the medication for a prolonged period of time, however.    2.  Tremors:  Shakes a lot--when fatigue started up--before coming to clinic.  No diarrhea, but does have constipation.  Skin is drier than usual.  Hair has been thinning out.  Has not had much of an appetite.  Cannot say if her weight has changed, however.  Checking her chart, has gained about 15 lbs since 12/2014  3.  Headaches:  Did start Allegra--helped with allergy symptoms--throat and ear complaints, but not with the headaches.  Headache in temple area bilaterally.  Hurts if she coughs.  Does have the orange card.   4.  BV:  Was treated for 7 days with Metronidazole, but since last seen, has had spotting almost every day. The discharge she had before, however, resolved with treatment. Pain is throbbing pain that worsens when she bends forward.    Current outpatient prescriptions:  .  fexofenadine (ALLEGRA) 180 MG tablet, Take 1 tablet (180 mg total) by mouth daily., Disp: , Rfl:  .  FLUoxetine (PROZAC) 10 MG tablet, Take 1 tablet (10 mg total) by mouth daily., Disp: 30 tablet, Rfl: 1 .  ibuprofen (ADVIL,MOTRIN) 800 MG tablet, Take 1 tablet (800 mg total) by mouth every 8 (eight) hours as needed., Disp: 60 tablet, Rfl: 1 .  Multiple Vitamin (MULTIVITAMIN WITH MINERALS) TABS, Take 1 tablet by mouth daily., Disp: , Rfl:  .  traZODone (DESYREL) 50 MG tablet, Take 0.5-1 tablets (25-50 mg total) by mouth at bedtime as  needed for sleep., Disp: 30 tablet, Rfl: 3   Allergies:  NKDA    Review of Systems     Objective:   Physical Exam Depressed appearing HEENT:  PERRL, EOMI, discs sharp.No exophalos or lid lag.  TMs pearly gray, throat without injection, Nasal mucosa swollen and erythematous mildy, L>R. NT over frontal and maxillary sinuses. Neck:  Supple, no adenopathy, no thyromegaly.  No thyroid bruit.Tender over traps bilaterally Chest:  CTA CV: RRR without murmur or rub, radial pulses normal and equal. Abd:  S, NT, No HSM or masses, +BS.  Pt. Refused pelvic exam today. Neuro:  A & O x 3, CN II-XII grossly intact, DTRs 2+/4, Motor 5/5 throughout.  Left hand with fine tremor       Assessment & Plan:  1.  Depression: Continues counseling with [name], LCSW, which she is finding helpful.  Mildly improved after 3 weeks of Fluoxetine and Trazodone as well.  Continue.  2.  Headaches:  Suspect multifactorial with allergies and muscle tension.  Neck stretching exercises given.  Add Flonase/Nasonex  To allergy regimen.  3.  Tremors and fatigue:  TSH, CBC, CMP.

## 2016-02-07 NOTE — Patient Instructions (Signed)
Flonase --start with 1 spray each nostril daily--until can get Nasonex 2 sprays each nostril daily from [facility] (public health dept on [address]) Continue with Fexofenadine (Allegra) 180 mg daily until you can go to [facility] and get Clarinex 5 mg for a year free.

## 2016-02-08 LAB — COMPREHENSIVE METABOLIC PANEL
ALT: 14 IU/L (ref 0–32)
AST: 18 IU/L (ref 0–40)
Albumin/Globulin Ratio: 1.5 (ref 1.1–2.5)
Albumin: 3.7 g/dL (ref 3.5–5.5)
Alkaline Phosphatase: 59 IU/L (ref 39–117)
BUN/Creatinine Ratio: 16 (ref 9–23)
BUN: 14 mg/dL (ref 6–24)
Bilirubin Total: <0.2 mg/dL (ref 0.0–1.2)
CALCIUM: 8.9 mg/dL (ref 8.7–10.2)
CO2: 22 mmol/L (ref 18–29)
CREATININE: 0.88 mg/dL (ref 0.57–1.00)
Chloride: 103 mmol/L (ref 96–106)
GFR calc Af Amer: 91 mL/min/{1.73_m2} (ref >59)
GFR, EST NON AFRICAN AMERICAN: 79 mL/min/{1.73_m2} (ref >59)
GLUCOSE: 76 mg/dL (ref 65–99)
Globulin, Total: 2.4 g/dL (ref 1.5–4.5)
POTASSIUM: 4.3 mmol/L (ref 3.5–5.2)
Sodium: 141 mmol/L (ref 134–144)
Total Protein: 6.1 g/dL (ref 6.0–8.5)

## 2016-02-08 LAB — CBC WITH DIFFERENTIAL/PLATELET
Basophils Absolute: 0 10*3/uL (ref 0.0–0.2)
Basos: 0 %
EOS (ABSOLUTE): 0.2 10*3/uL (ref 0.0–0.4)
EOS: 3 %
Hematocrit: 38.4 % (ref 34.0–46.6)
Hemoglobin: 12.7 g/dL (ref 11.1–15.9)
Immature Grans (Abs): 0 10*3/uL (ref 0.0–0.1)
Immature Granulocytes: 0 %
Lymphocytes Absolute: 1.7 10*3/uL (ref 0.7–3.1)
Lymphs: 22 %
MCH: 31.5 pg (ref 26.6–33.0)
MCHC: 33.1 g/dL (ref 31.5–35.7)
MCV: 95 fL (ref 79–97)
Monocytes Absolute: 0.4 10*3/uL (ref 0.1–0.9)
Monocytes: 5 %
NEUTROS PCT: 70 %
Neutrophils Absolute: 5.6 10*3/uL (ref 1.4–7.0)
PLATELETS: 312 10*3/uL (ref 150–379)
RBC: 4.03 x10E6/uL (ref 3.77–5.28)
RDW: 14.2 % (ref 12.3–15.4)
WBC: 7.9 10*3/uL (ref 3.4–10.8)

## 2016-02-08 LAB — TSH: TSH: 2.27 u[IU]/mL (ref 0.450–4.500)

## 2016-02-24 ENCOUNTER — Ambulatory Visit: Payer: Self-pay | Admitting: Internal Medicine

## 2016-02-24 ENCOUNTER — Other Ambulatory Visit: Payer: Self-pay | Admitting: Licensed Clinical Social Worker

## 2016-03-02 ENCOUNTER — Ambulatory Visit (INDEPENDENT_AMBULATORY_CARE_PROVIDER_SITE_OTHER): Payer: Medicaid Other | Admitting: Licensed Clinical Social Worker

## 2016-03-02 DIAGNOSIS — F331 Major depressive disorder, recurrent, moderate: Secondary | ICD-10-CM
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
## 2016-03-16 ENCOUNTER — Other Ambulatory Visit: Payer: No Typology Code available for payment source | Admitting: Licensed Clinical Social Worker

## 2016-04-06 ENCOUNTER — Ambulatory Visit (INDEPENDENT_AMBULATORY_CARE_PROVIDER_SITE_OTHER): Payer: Medicaid Other | Admitting: Internal Medicine

## 2016-04-06 ENCOUNTER — Encounter: Payer: Self-pay | Admitting: Internal Medicine

## 2016-04-06 VITALS — BP 130/90 | HR 75 | Resp 18 | Ht 62.0 in | Wt 155.5 lb

## 2016-04-06 DIAGNOSIS — F339 Major depressive disorder, recurrent, unspecified: Secondary | ICD-10-CM

## 2016-04-06 MED ORDER — FLUOXETINE HCL 10 MG PO TABS: 10.0000 mg | ORAL_TABLET | Freq: Every day | ORAL | Status: DC

## 2016-04-06 NOTE — Progress Notes (Signed)
   Subjective:    Patient ID: [name], female    DOB: 1971/10/03, 45 y.o.   MRN: [id number]  HPI   1.  Depression: Energy is much better.  Feelings of depression much improved.  Sleeping well.  Feels refreshed upon awakening in the morning.  Continues on 10 mg of Fluoxetine and the Trazodone 25 mg at bedtime.  Still not at what she would consider baseline, but much better.  Her children have noted the improvement--she seems happier to them. Is more physically active--yardwork, etc.  Getting out with family and with errands regularly.  Later, patient admits she was missing the Fluoxetine for about 2 weeks, did not feel well and restarted.  2. Seasonal allergies:  Better as well.  Using Fexofenadine and Nasonex spray regularly.  Has symptoms years round, with warm seasonal worsening.  Headaches much better also  3.  Nervousness/tremors:  Still noting, but stopped and started Fluoxetine.  TSH and other labs were normal last visit.   Current outpatient prescriptions:  .  fexofenadine (ALLEGRA) 180 MG tablet, Take 1 tablet (180 mg total) by mouth daily., Disp: , Rfl:  .  FLUoxetine (PROZAC) 10 MG tablet, Take 1 tablet (10 mg total) by mouth daily., Disp: 30 tablet, Rfl: 11 .  ibuprofen (ADVIL,MOTRIN) 800 MG tablet, Take 1 tablet (800 mg total) by mouth every 8 (eight) hours as needed., Disp: 60 tablet, Rfl: 1 .  mometasone (NASONEX) 50 MCG/ACT nasal spray, 2 sprays each nostril daily, Disp: 17 g, Rfl: 11 .  Multiple Vitamin (MULTIVITAMIN WITH MINERALS) TABS, Take 1 tablet by mouth daily., Disp: , Rfl:  .  traZODone (DESYREL) 50 MG tablet, Take 0.5-1 tablets (25-50 mg total) by mouth at bedtime as needed for sleep., Disp: 30 tablet, Rfl: 3   No Known Allergies  Review of Systems     Objective:   Physical Exam   HEENT:  PERRL, EOMI, TMs pearly gray, throat without injection or cobbling,  Neck:  Supple, no adenopathy Chest: CTA CV:  RRR without murmur or rub, Radial pulses normal  and equal Neuro:  No tremulousness noted in hands        Assessment & Plan:  1.  Depression:  Still seems to have somewhat depressed affect, but also seems quite introverted, which I may be mistaking for depression.  Encouraged pt. To not suddenly stop Fluoxetine.  Discussed may need to take lifelong, but would recommend for at least 1 year before titrating off if so desires.   Continue Trazodone at bedtime as well. Continue counseling with [name], LCSW.  Follow up 2 months.  If stable, set up for CPE without pap--had one last year, [facility] at [facility].  2.  Allergies and Headache:  Continue Nasonex and Fexofenadine.

## 2016-04-09 ENCOUNTER — Other Ambulatory Visit: Payer: Self-pay | Admitting: Internal Medicine

## 2016-04-09 DIAGNOSIS — J3089 Other allergic rhinitis: Secondary | ICD-10-CM

## 2016-04-09 NOTE — Telephone Encounter (Signed)
Fax request from [facility] Dept.  Refill for Rx: CLARINEX 5mg  tab.  Quantity 30 tab.  Patient states she currently takes ALLEGRA; [facility] can obtain CLARINEX for free; would Dr. Consider writing a new Rx for [facility]?  Fax request from [facility] Dept. Refill for Rx: NASONEX 50 Mcg, Quantity 1 EAC.  Patient states she uses FLONASE; [facility] can obtain NASONEX for free; would Dr. Consider writing a new Rx for NASONEX?

## 2016-04-10 MED ORDER — MOMETASONE FUROATE 50 MCG/ACT NA SUSP: NASAL | Status: DC

## 2016-04-10 MED ORDER — DESLORATADINE 5 MG PO TABS: 5.0000 mg | ORAL_TABLET | Freq: Every day | ORAL | Status: DC

## 2016-04-10 NOTE — Telephone Encounter (Signed)
Called and spoke with [place].  Patient actually did go through process with [facility], though voiced that she did not I though in office.  Will go ahead and send Rx for Nasonex and Clarinex for her. Please call and let patient know that she should stop any of the other antihistamines (allegra) and nasal sprays (fluticasone) when she starts the meds from [facility] -- Clarinex and Nasonex

## 2016-04-10 NOTE — Telephone Encounter (Signed)
The request for Rx refill was received via Fax into this office.  [name] did not speak directly to patient.

## 2016-04-11 NOTE — Telephone Encounter (Signed)
Fraser Din called patient on 04/11/2016 and informed her of message left by Dr. Amil Amen.

## 2016-04-13 ENCOUNTER — Other Ambulatory Visit: Payer: No Typology Code available for payment source | Admitting: Licensed Clinical Social Worker

## 2016-04-17 ENCOUNTER — Other Ambulatory Visit: Payer: No Typology Code available for payment source | Admitting: Licensed Clinical Social Worker

## 2016-04-17 ENCOUNTER — Telehealth: Payer: Self-pay | Admitting: Licensed Clinical Social Worker

## 2016-04-17 NOTE — Telephone Encounter (Signed)
LCSW received text message from pt stating that she wanted to cancel today's counseling session and that she would not be returning for counseling services. LCSW called pt but was not able to leave a voicemail. LCSW sent text encouraging pt to return to counseling in the future if needed, and to also consider Hospice grief counseling.

## 2016-06-08 ENCOUNTER — Ambulatory Visit (INDEPENDENT_AMBULATORY_CARE_PROVIDER_SITE_OTHER): Payer: Medicaid Other | Admitting: Family Medicine

## 2016-06-08 ENCOUNTER — Other Ambulatory Visit: Payer: Self-pay

## 2016-06-08 ENCOUNTER — Encounter: Payer: Self-pay | Admitting: Family Medicine

## 2016-06-08 VITALS — BP 104/62 | HR 64 | Temp 98.4°F | Resp 16 | Ht 62.0 in | Wt 151.0 lb

## 2016-06-08 DIAGNOSIS — N898 Other specified noninflammatory disorders of vagina: Secondary | ICD-10-CM | POA: Diagnosis not present

## 2016-06-08 DIAGNOSIS — Z23 Encounter for immunization: Secondary | ICD-10-CM

## 2016-06-08 DIAGNOSIS — F331 Major depressive disorder, recurrent, moderate: Secondary | ICD-10-CM | POA: Diagnosis not present

## 2016-06-08 DIAGNOSIS — J302 Other seasonal allergic rhinitis: Secondary | ICD-10-CM

## 2016-06-08 DIAGNOSIS — R109 Unspecified abdominal pain: Secondary | ICD-10-CM

## 2016-06-08 LAB — POCT WET PREP WITH KOH
KOH PREP POC: NEGATIVE
TRICHOMONAS UA: NEGATIVE

## 2016-06-08 LAB — POCT URINALYSIS DIPSTICK
Bilirubin, UA: NEGATIVE
Blood, UA: NEGATIVE
GLUCOSE UA: NEGATIVE
KETONES UA: NEGATIVE
Leukocytes, UA: NEGATIVE
Nitrite, UA: NEGATIVE
Protein, UA: NEGATIVE
Spec Grav, UA: 1.025
Urobilinogen, UA: 0.2
pH, UA: 5

## 2016-06-08 LAB — POCT URINE PREGNANCY: Preg Test, Ur: NEGATIVE

## 2016-06-08 MED ORDER — METRONIDAZOLE 500 MG PO TABS: 500.0000 mg | ORAL_TABLET | Freq: Two times a day (BID) | ORAL | Status: DC

## 2016-06-08 MED ORDER — FEXOFENADINE HCL 180 MG PO TABS: 180.0000 mg | ORAL_TABLET | Freq: Every day | ORAL | Status: DC

## 2016-06-08 MED ORDER — FLUOXETINE HCL 20 MG PO TABS: 20.0000 mg | ORAL_TABLET | Freq: Every day | ORAL | Status: DC

## 2016-06-12 LAB — GC/CHLAMYDIA PROBE AMP
Chlamydia trachomatis, NAA: NEGATIVE
Neisseria gonorrhoeae by PCR: NEGATIVE

## 2016-07-04 ENCOUNTER — Ambulatory Visit (INDEPENDENT_AMBULATORY_CARE_PROVIDER_SITE_OTHER): Payer: Medicaid Other | Admitting: Internal Medicine

## 2016-07-04 ENCOUNTER — Encounter: Payer: Self-pay | Admitting: Internal Medicine

## 2016-07-04 VITALS — BP 120/80 | HR 80 | Resp 14 | Ht 62.0 in | Wt 154.0 lb

## 2016-07-04 DIAGNOSIS — M25531 Pain in right wrist: Secondary | ICD-10-CM

## 2016-07-04 DIAGNOSIS — F329 Major depressive disorder, single episode, unspecified: Secondary | ICD-10-CM | POA: Diagnosis not present

## 2016-07-04 DIAGNOSIS — J3089 Other allergic rhinitis: Secondary | ICD-10-CM | POA: Diagnosis not present

## 2016-07-04 DIAGNOSIS — F32A Depression, unspecified: Secondary | ICD-10-CM

## 2016-07-04 MED ORDER — IBUPROFEN 600 MG PO TABS: 600.0000 mg | ORAL_TABLET | Freq: Three times a day (TID) | ORAL | 1 refills | Status: DC | PRN

## 2016-07-04 MED ORDER — FLUTICASONE PROPIONATE 50 MCG/ACT NA SUSP: 2.0000 | Freq: Every day | NASAL | 11 refills | Status: DC

## 2016-07-04 MED ORDER — FLUOXETINE HCL 20 MG PO CAPS: 20.0000 mg | ORAL_CAPSULE | Freq: Every day | ORAL | 3 refills | Status: DC

## 2016-07-04 NOTE — Progress Notes (Signed)
   Subjective:    Patient ID: [name], female    DOB: [birth year], 45 y.o.   MRN: [id number]  HPI   1.  Depression:  Did not fill the 20 mg tab of Fluoxetine as the cap is covered by Medicaid, not the tab.  Discussed to notify us in the future if a problem with medication. Still feels down.  Crying this morning--has not cried in a week.   No suicidal ideation, though sometimes wonders if better off if weren't here.    2.  Right wrist pain:  1 month ago, went to pick something up and was supinating her forearm at the same time.  Heard a pop and has had nagging achy pain in ulnar wrist since as well as swelling of dorsal radial side of wrist.  Has been wearing a short velcro wrist wrap since.   No numbness or tingling in the hand or arm.  Was taking Aleve 1 tab once or twice daily without any improvement.   3.  Allergies:  Has Medicaid now.  Buying Allegra over the counter.  Helps somewhat, but mainly has nasal stuffiness and would like the corticosteroid nasal spray, which never came in through the MAP program.  She cannot use that program now that she has Medicaid. This discussed at end of visit after evaluation   Current Outpatient Prescriptions:  .  fexofenadine (ALLEGRA) 180 MG tablet, Take 1 tablet (180 mg total) by mouth daily., Disp: 30 tablet, Rfl: 11 .  Multiple Vitamin (MULTIVITAMIN WITH MINERALS) TABS, Take 1 tablet by mouth daily., Disp: , Rfl:  .  naproxen sodium (ANAPROX) 220 MG tablet, Take 220 mg by mouth 2 (two) times daily with a meal., Disp: , Rfl:  .  FLUoxetine (PROZAC) 20 MG capsule, Take 1 capsule (20 mg total) by mouth daily., Disp: 30 capsule, Rfl: 3 .  ibuprofen (ADVIL,MOTRIN) 800 MG tablet, Take 1 tablet (800 mg total) by mouth every 8 (eight) hours as needed. (Patient not taking: Reported on 07/04/2016), Disp: 60 tablet, Rfl: 1   No Known Allergies  Review of Systems     Objective:   Physical Exam Depressed Affect:  Looking down, little eye contact,  quiet voice. Right wrist: Full ROM, but with distal ulnar/ulnar wrist pain with supination.  No palpable swelling, no crepitation.  NT on palpation of wrist, no effusion, no erythema        Assessment & Plan:  1.  Right wrist pain:  Concern that 1 month out she is still having significant pain, though unable to elicit any tenderness or much pain today.  Referral to Hand Surgeon for the possibility of ligamentous injury Wrist splint for better support and immobilization given  2.  Depression:  Change to Fluoxetine caps, which do appear to be covered and increase to 20 mg.  Follow up in 1 month.   She is open to following up with Victoria Burows, LCSW, but just does not feel "talking about things"  Is good for her--just causes her to relive things. Spoke with [name] and she will give patient a call to discuss other options of therapy, particularly if significant trauma involved.  3. Allergies:  Rx for nasal fluticasone.  Will continue Allegra as needed also.

## 2016-07-04 NOTE — Patient Instructions (Signed)
Wear wrist splint during day and only at night if you have pain turning over, etc.

## 2016-07-05 ENCOUNTER — Telehealth: Payer: Self-pay | Admitting: Licensed Clinical Social Worker

## 2016-07-05 NOTE — Telephone Encounter (Signed)
LCSW called pt to discuss counseling options such as EMDR for ongoing depression and trauma history. Left a voicemail.

## 2016-07-25 ENCOUNTER — Encounter: Payer: Self-pay | Admitting: Family Medicine

## 2016-07-25 NOTE — Progress Notes (Signed)
   Subjective:    Patient ID: [name], female    DOB: 03/08/1971, 45 y.o.   MRN: [id number]  HPI  Transcribing written record from this date for Dr. Carolann Littler  1.  Follow up on Depression:  Husband "murdered"  Last August.  C/O depressed mood since then.  Prozac helps "a little."  No side effects.  Denies suicidal thoughts.  Does not like Trazodone-gives her headaches-not taking.  2.  C/O Vaginal D/C- recurrent, x 2 weeks, since last time she had sex with occasional partner.  No pelvic pain, fever, urinary sx.  S/P BTL.  No vaginal itching.  Has Irreg. Spotting since uterine ablation 2 years ago.  3.  Allergies:  Needs new Rx for Allegra--working well.  Current Meds  Medication Sig  . Multiple Vitamin (MULTIVITAMIN WITH MINERALS) TABS Take 1 tablet by mouth daily.  . [DISCONTINUED] fexofenadine (ALLEGRA) 180 MG tablet Take 180 mg by mouth daily.  . [DISCONTINUED] FLUoxetine (PROZAC) 10 MG tablet Take 1 tablet (10 mg total) by mouth daily.  . [DISCONTINUED] ibuprofen (ADVIL,MOTRIN) 800 MG tablet Take 1 tablet (800 mg total) by mouth every 8 (eight) hours as needed. (Patient not taking: Reported on 07/04/2016)  . [DISCONTINUED] mometasone (NASONEX) 50 MCG/ACT nasal spray 2 sprays each nostril daily  . [DISCONTINUED] traZODone (DESYREL) 50 MG tablet Take 0.5-1 tablets (25-50 mg total) by mouth at bedtime as needed for sleep.    No Known Allergies    Review of Systems     Objective:   Physical Exam WNWD/NAD Skin:  No rash HEENT:  MM's moist/ no lesions Neck:  Supple, No adenopathy Chest:  Clear CV:  RRR Abdomen/Pelvis:  NT/ No masses, No HSM Pelvic:  Ext. Vag. Nl, No lesions  Wet prep with + Clue cells, no trich, few WBCs, No yeast Urine pregnancy:  Neg     Assessment & Plan:  1.  Depression:  Partially treated.  Increase Fluoxetine to 20 mg daily.  Take 2 of 10 mg tabs daily until gone, then switch to 1 tab of 20 mg tabs daily. Recommend she see therapist  [name]) again. F/U in 2 months  2.  Vaginal D/C:  Likely BV:  Metronidazole 500 mg BID x 7 days.  Sent GC/Chlamydia  3.  Allergies:  RF Allega # 30/6 RF  4.  Tdap needed:  Ok to give today.

## 2016-08-08 ENCOUNTER — Other Ambulatory Visit: Payer: Self-pay | Admitting: Specialist

## 2016-08-08 ENCOUNTER — Ambulatory Visit (INDEPENDENT_AMBULATORY_CARE_PROVIDER_SITE_OTHER): Payer: Medicaid Other | Admitting: Internal Medicine

## 2016-08-08 ENCOUNTER — Encounter: Payer: Self-pay | Admitting: Internal Medicine

## 2016-08-08 VITALS — BP 120/78 | HR 70 | Resp 16 | Ht 61.0 in | Wt 155.0 lb

## 2016-08-08 DIAGNOSIS — Z1239 Encounter for other screening for malignant neoplasm of breast: Secondary | ICD-10-CM | POA: Diagnosis not present

## 2016-08-08 DIAGNOSIS — N63 Unspecified lump in breast: Secondary | ICD-10-CM

## 2016-08-08 DIAGNOSIS — Z0000 Encounter for general adult medical examination without abnormal findings: Secondary | ICD-10-CM | POA: Diagnosis not present

## 2016-08-08 DIAGNOSIS — Z9189 Other specified personal risk factors, not elsewhere classified: Secondary | ICD-10-CM | POA: Diagnosis not present

## 2016-08-08 DIAGNOSIS — Z87898 Personal history of other specified conditions: Secondary | ICD-10-CM

## 2016-08-08 DIAGNOSIS — N898 Other specified noninflammatory disorders of vagina: Secondary | ICD-10-CM | POA: Diagnosis not present

## 2016-08-08 DIAGNOSIS — N6320 Unspecified lump in the left breast, unspecified quadrant: Secondary | ICD-10-CM

## 2016-08-08 LAB — POCT WET PREP WITH KOH
CLUE CELLS WET PREP PER HPF POC: NEGATIVE
KOH Prep POC: NEGATIVE
RBC Wet Prep HPF POC: NEGATIVE
TRICHOMONAS UA: NEGATIVE
YEAST WET PREP PER HPF POC: NEGATIVE

## 2016-08-08 MED ORDER — FLUOXETINE HCL 20 MG PO CAPS: 20.0000 mg | ORAL_CAPSULE | Freq: Every day | ORAL | 11 refills | Status: DC

## 2016-08-08 NOTE — Patient Instructions (Signed)
Can google "advance directives, [place]"  And bring up form from Secretary of State. Print and fill out Or can go to "5 wishes"  Which is also in Spanish and fill out--this costs $5--perhaps easier to use. Designate a Medical Power of Attorney to speak for you if you are unable to speak for yourself when ill or injured  

## 2016-08-08 NOTE — Progress Notes (Signed)
Subjective:    Patient ID: [name], female    DOB: [birth year], 45 y.o.   MRN: [id number]  HPI   Here for CPE  Health Maintenance:  1.  Last pap:  Pt. States had pap 1 year ago, though not what is found in chart.  Thinks she had it done at [facility], which would not be in EPIC  2.  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  Never followed up as recommended.  Has not noted any change with SBE  3.  Hemoccult Cards:  Never  4.  Colonoscopy:  never  Concerns:  1.  Right wrist pain:  Has an appt. With hand surgeon this Friday.  Has been using hand a lot with splint--splint actually falling apart.  2.  Depression:  Improved with increase of Fluoxetine to 20 mg daily. States feeling much better with current dosing.  Is having difficulty with sleeping again, however.  Gets up and does busy work.    Review of Systems  Constitutional: Positive for fatigue. Negative for activity change, appetite change and unexpected weight change.  HENT: Negative for dental problem, ear pain and hearing loss.   Eyes: Negative for pain and visual disturbance.       Wears reading glasses  Respiratory: Positive for cough. Negative for shortness of breath.        Allergies starting up  Cardiovascular: Negative for chest pain, palpitations and leg swelling.  Gastrointestinal: Negative for abdominal pain, blood in stool, constipation, diarrhea and nausea.       No melena  Endocrine: Negative for cold intolerance, heat intolerance, polydipsia and polyuria.  Genitourinary: Positive for vaginal discharge. Negative for dyspareunia, dysuria, hematuria and urgency.       Vaginal discharge white to clear.  No odor or pelvic pain.  No fever  Musculoskeletal: Negative for arthralgias.  Skin: Negative for color change and rash.  Allergic/Immunologic: Positive for environmental allergies.  Neurological: Negative for  dizziness, weakness and numbness.  Hematological: Negative for adenopathy. Does not bruise/bleed easily.  Psychiatric/Behavioral: Positive for decreased concentration and dysphoric mood. Negative for suicidal ideas. The patient is nervous/anxious.        Though feeling better since increase of Fluoxetine.  Has not been back to counseling with [name], LCSW, but they have made contact.   Current Meds  Medication Sig  . fexofenadine (ALLEGRA) 180 MG tablet Take 1 tablet (180 mg total) by mouth daily.  Marland Kitchen FLUoxetine (PROZAC) 20 MG capsule Take 1 capsule (20 mg total) by mouth daily.  . fluticasone (FLONASE) 50 MCG/ACT nasal spray Place 2 sprays into both nostrils daily.  Marland Kitchen ibuprofen (ADVIL,MOTRIN) 600 MG tablet Take 1 tablet (600 mg total) by mouth every 8 (eight) hours as needed (with food).  . Multiple Vitamin (MULTIVITAMIN WITH MINERALS) TABS Take 1 tablet by mouth daily.  . [DISCONTINUED] FLUoxetine (PROZAC) 20 MG capsule Take 1 capsule (20 mg total) by mouth daily.   No Known Allergies   Past Medical History:  Diagnosis Date  . Allergy    Environmental and seasonal  . Amniotic fluid embolism 2004   history 2004 delivery resolved  . Anemia    hx  . Chronic headaches    otc med prn  . Depression    Treated with Celexa in past.    . Gastritis   . GERD (gastroesophageal reflux disease)  diet controlled -no meds  . History of blood transfusion 2004   [place]   . Hypertension 2004   Hx PIH with 2004 preg - resolved  . Hypothyroidism    hx - but resolved  . Irritable bowel syndrome   . Myocardial infarction [facility]) 2004   DIC, hypertension during 2004 delivery, no problems since  . Respiratory failure, acute ([place]) 2004   hx 2004 w preg - resolved  . SVD (spontaneous vaginal delivery)    x 5   Past Surgical History:  Procedure Laterality Date  . CARDIAC CATHETERIZATION  2004   test normal - no blockages; Reportedly from DIC?  . DILATION AND CURETTAGE OF UTERUS  11/2013    Novasure radiofrequency ablation  . HYSTEROSCOPY WITH NOVASURE N/A 11/27/2013   Procedure: HYSTEROSCOPY WITH NOVASURE;  Surgeon: Lavonia Drafts, MD;  Location: [facility];  Service: Gynecology;  Laterality: N/A;  . KNEE ARTHROSCOPY     Right knee  . TUBAL LIGATION     Family History  Problem Relation Age of Onset  . Arthritis Father   . Hypertension Father   . Depression Brother   . Allergies Son   . Prostate cancer Paternal Uncle   . Stomach cancer Paternal Grandmother    Social History   Social History  . Marital status: Legally Separated    Spouse name: N/A  . Number of children: 5  . Years of education: N/A   Occupational History  . Unemployed     Has not worked since husband killed in August 2016.     Social History Main Topics  . Smoking status: Never Smoker  . Smokeless tobacco: Never Used  . Alcohol use 0.0 oz/week     Comment: Drinking a bit more than before ex husband killed (separated for 2 1/2 years) in August os 2016:  2-3 times weekly.  3-4 beers when drinks.  . Drug use:      Comment: marijuana in past.  Last was 2 years ago.  Marland Kitchen Sexual activity: Yes    Birth control/ protection: Surgical   Other Topics Concern  . Not on file   Social History Narrative   Lives with 2 youngest children, son and daughter.   Has not worked since estranged husband killed   Previously worked in [employer].   Depression she feels is what is keeping her from working.          Objective:   Physical Exam  Constitutional: She is oriented to person, place, and time. She appears well-developed and well-nourished.  HENT:  Head: Normocephalic and atraumatic.  Right Ear: Hearing, tympanic membrane, external ear and ear canal normal.  Left Ear: Hearing, tympanic membrane, external ear and ear canal normal.  Nose: Nose normal.  Mouth/Throat: Uvula is midline, oropharynx is clear and moist and mucous membranes are normal.  Eyes: Conjunctivae and EOM are  normal. Pupils are equal, round, and reactive to light.  Discs sharp bilaterally  Neck: Normal range of motion. Neck supple. No thyroid mass and no thyromegaly present.  Cardiovascular: Normal rate, regular rhythm, S1 normal, S2 normal and normal pulses.  Exam reveals no S3, no S4 and no friction rub.   No murmur heard. Pulmonary/Chest: Effort normal and breath sounds normal. Right breast exhibits no inverted nipple, no mass, no nipple discharge, no skin change and no tenderness. Left breast exhibits no inverted nipple, no mass, no nipple discharge, no skin change and no tenderness.  Abdominal: Soft. Bowel sounds are normal.  She exhibits no mass. There is no hepatosplenomegaly. There is no tenderness. No hernia.  Genitourinary: Rectum normal and uterus normal. Rectal exam shows no mass, anal tone normal and guaiac negative stool. There is no rash or lesion on the right labia. There is no rash or lesion on the left labia. Cervix exhibits no motion tenderness and no friability. Right adnexum displays no mass, no tenderness and no fullness. Left adnexum displays no mass, no tenderness and no fullness. Vaginal discharge found.  Musculoskeletal: Normal range of motion.  Lymphadenopathy:       Head (right side): No submental and no submandibular adenopathy present.       Head (left side): No submental and no submandibular adenopathy present.    She has no cervical adenopathy.    She has no axillary adenopathy.       Right: No inguinal, no supraclavicular and no epitrochlear adenopathy present.       Left: No inguinal, no supraclavicular and no epitrochlear adenopathy present.  Neurological: She is alert and oriented to person, place, and time. She has normal strength and normal reflexes. No cranial nerve deficit or sensory deficit. Coordination and gait normal.  Skin: Skin is warm and dry. No lesion and no rash noted.  Psychiatric: Her behavior is normal. Thought content normal.  Better eye contact, but  still with mildly depressed affect.          Assessment & Plan:  1.  CPE without pap:  Release for PHD records for most recent pap.  Did find a normal pap from 2014 in old records Mammogram changed to diagnostic due to never following up with concerns from 2007.  Will likely need U/S as well. Return for FLP as nonfasting Hemoccult cards x 3, to return in 2 weeks or when comes in for fasting labs Extensive labs performed in February and normal: CBC, CMP, TSH   2.  Vaginal Discharge:  Mild, Wet prep normal:  GC/chlamydia  3.  Right Wrist pain:  To hold on replacing splint as has upcoming evaluation with hand surgeon.  Discussed the splint was to keep her from using the hand, which she appears to have utilized extensively based on appearance of splint.  4. Depression:  Encouraged patient to follow up with counseling, [name], LCSW

## 2016-08-14 ENCOUNTER — Other Ambulatory Visit: Payer: Self-pay | Admitting: Orthopedic Surgery

## 2016-08-14 DIAGNOSIS — M25531 Pain in right wrist: Secondary | ICD-10-CM

## 2016-08-15 ENCOUNTER — Other Ambulatory Visit: Payer: Self-pay

## 2016-08-15 DIAGNOSIS — N6310 Unspecified lump in the right breast, unspecified quadrant: Secondary | ICD-10-CM

## 2016-08-16 ENCOUNTER — Other Ambulatory Visit: Payer: Medicaid Other

## 2016-08-23 ENCOUNTER — Other Ambulatory Visit: Payer: Medicaid Other

## 2016-09-03 ENCOUNTER — Ambulatory Visit
Admission: RE | Admit: 2016-09-03 | Discharge: 2016-09-03 | Disposition: A | Discharge status: Home / Self Care | Payer: Medicaid Other | Source: Ambulatory Visit | Attending: Orthopedic Surgery | Admitting: Orthopedic Surgery

## 2016-09-03 DIAGNOSIS — M25531 Pain in right wrist: Secondary | ICD-10-CM

## 2016-09-03 MED ORDER — IOPAMIDOL (ISOVUE-M 200) INJECTION 41%
1.5000 mL | Freq: Once | INTRAMUSCULAR | Status: AC
  Administered 2016-09-03: 1.5 mL via INTRA_ARTICULAR

## 2016-09-05 ENCOUNTER — Other Ambulatory Visit: Payer: Self-pay | Admitting: Orthopedic Surgery

## 2016-09-18 ENCOUNTER — Encounter (HOSPITAL_BASED_OUTPATIENT_CLINIC_OR_DEPARTMENT_OTHER): Payer: Self-pay | Admitting: *Deleted

## 2016-09-20 ENCOUNTER — Ambulatory Visit
Admission: RE | Admit: 2016-09-20 | Discharge: 2016-09-20 | Disposition: A | Discharge status: Home / Self Care | Payer: Medicaid Other | Source: Ambulatory Visit | Attending: Internal Medicine | Admitting: Internal Medicine

## 2016-09-25 ENCOUNTER — Encounter (HOSPITAL_BASED_OUTPATIENT_CLINIC_OR_DEPARTMENT_OTHER): Payer: Self-pay | Admitting: Anesthesiology

## 2016-09-25 ENCOUNTER — Encounter (HOSPITAL_BASED_OUTPATIENT_CLINIC_OR_DEPARTMENT_OTHER)
Admission: RE | Disposition: A | Discharge status: Home / Self Care | Payer: Self-pay | Source: Ambulatory Visit | Attending: Orthopedic Surgery

## 2016-09-25 ENCOUNTER — Ambulatory Visit (HOSPITAL_BASED_OUTPATIENT_CLINIC_OR_DEPARTMENT_OTHER): Payer: Medicaid Other | Admitting: Anesthesiology

## 2016-09-25 ENCOUNTER — Ambulatory Visit (HOSPITAL_BASED_OUTPATIENT_CLINIC_OR_DEPARTMENT_OTHER)
Admission: RE | Admit: 2016-09-25 | Discharge: 2016-09-25 | Disposition: A | Discharge status: Home / Self Care | Payer: Medicaid Other | Source: Ambulatory Visit | Attending: Orthopedic Surgery | Admitting: Orthopedic Surgery

## 2016-09-25 DIAGNOSIS — F329 Major depressive disorder, single episode, unspecified: Secondary | ICD-10-CM | POA: Insufficient documentation

## 2016-09-25 DIAGNOSIS — Z79899 Other long term (current) drug therapy: Secondary | ICD-10-CM | POA: Insufficient documentation

## 2016-09-25 DIAGNOSIS — S63511A Sprain of carpal joint of right wrist, initial encounter: Secondary | ICD-10-CM | POA: Insufficient documentation

## 2016-09-25 DIAGNOSIS — Z7951 Long term (current) use of inhaled steroids: Secondary | ICD-10-CM | POA: Insufficient documentation

## 2016-09-25 DIAGNOSIS — I252 Old myocardial infarction: Secondary | ICD-10-CM | POA: Insufficient documentation

## 2016-09-25 DIAGNOSIS — S63591A Other specified sprain of right wrist, initial encounter: Secondary | ICD-10-CM | POA: Diagnosis present

## 2016-09-25 DIAGNOSIS — X501XXA Overexertion from prolonged static or awkward postures, initial encounter: Secondary | ICD-10-CM | POA: Insufficient documentation

## 2016-09-25 HISTORY — PX: WRIST ARTHROSCOPY WITH FOVEAL TRIANGULAR FIBROCARTILAGE COMPLEX REPAIR: SHX6403

## 2016-09-25 SURGERY — WRIST ARTHROSCOPY WITH FOVEAL TRIANGULAR FIBROCARTILAGE COMPLEX REPAIR
Anesthesia: General | Site: Wrist | Laterality: Right

## 2016-09-25 MED ORDER — MIDAZOLAM HCL 2 MG/2ML IJ SOLN
INTRAMUSCULAR | Status: AC
  Filled 2016-09-25: qty 2

## 2016-09-25 MED ORDER — PROMETHAZINE HCL 25 MG/ML IJ SOLN: 6.2500 mg | INTRAMUSCULAR | Status: DC | PRN

## 2016-09-25 MED ORDER — LIDOCAINE HCL (CARDIAC) 20 MG/ML IV SOLN
INTRAVENOUS | Status: DC | PRN
  Administered 2016-09-25: 30 mg via INTRAVENOUS
  Administered 2016-09-25: 50 mg via INTRAVENOUS

## 2016-09-25 MED ORDER — CHLORHEXIDINE GLUCONATE 4 % EX LIQD: 60.0000 mL | Freq: Once | CUTANEOUS | Status: DC

## 2016-09-25 MED ORDER — SUCCINYLCHOLINE CHLORIDE 20 MG/ML IJ SOLN
INTRAMUSCULAR | Status: DC | PRN
  Administered 2016-09-25: 100 mg via INTRAVENOUS

## 2016-09-25 MED ORDER — BUPIVACAINE HCL (PF) 0.5 % IJ SOLN
INTRAMUSCULAR | Status: DC | PRN
  Administered 2016-09-25: 22 mL via PERINEURAL

## 2016-09-25 MED ORDER — DEXAMETHASONE SODIUM PHOSPHATE 10 MG/ML IJ SOLN
INTRAMUSCULAR | Status: AC
  Filled 2016-09-25: qty 1

## 2016-09-25 MED ORDER — CEFAZOLIN SODIUM-DEXTROSE 2-4 GM/100ML-% IV SOLN
INTRAVENOUS | Status: AC
  Filled 2016-09-25: qty 100

## 2016-09-25 MED ORDER — SUCCINYLCHOLINE CHLORIDE 200 MG/10ML IV SOSY
PREFILLED_SYRINGE | INTRAVENOUS | Status: AC
  Filled 2016-09-25: qty 10

## 2016-09-25 MED ORDER — HYDROMORPHONE HCL 1 MG/ML IJ SOLN: 0.2500 mg | INTRAMUSCULAR | Status: DC | PRN

## 2016-09-25 MED ORDER — PROPOFOL 10 MG/ML IV BOLUS
INTRAVENOUS | Status: DC | PRN
  Administered 2016-09-25: 50 mg via INTRAVENOUS
  Administered 2016-09-25: 150 mg via INTRAVENOUS

## 2016-09-25 MED ORDER — SCOPOLAMINE 1 MG/3DAYS TD PT72: 1.0000 | MEDICATED_PATCH | Freq: Once | TRANSDERMAL | Status: DC | PRN

## 2016-09-25 MED ORDER — EPHEDRINE 5 MG/ML INJ
INTRAVENOUS | Status: AC
  Filled 2016-09-25: qty 10

## 2016-09-25 MED ORDER — LACTATED RINGERS IV SOLN
INTRAVENOUS | Status: DC
  Administered 2016-09-25: 11:00:00 via INTRAVENOUS

## 2016-09-25 MED ORDER — ONDANSETRON HCL 4 MG/2ML IJ SOLN
INTRAMUSCULAR | Status: DC | PRN
  Administered 2016-09-25: 4 mg via INTRAVENOUS

## 2016-09-25 MED ORDER — MIDAZOLAM HCL 2 MG/2ML IJ SOLN
1.0000 mg | INTRAMUSCULAR | Status: DC | PRN
  Administered 2016-09-25 (×2): 1 mg via INTRAVENOUS

## 2016-09-25 MED ORDER — FENTANYL CITRATE (PF) 100 MCG/2ML IJ SOLN
50.0000 ug | INTRAMUSCULAR | Status: DC | PRN
  Administered 2016-09-25 (×2): 50 ug via INTRAVENOUS

## 2016-09-25 MED ORDER — HYDROCODONE-ACETAMINOPHEN 5-325 MG PO TABS: 1.0000 | ORAL_TABLET | Freq: Four times a day (QID) | ORAL | 0 refills | Status: DC | PRN

## 2016-09-25 MED ORDER — GLYCOPYRROLATE 0.2 MG/ML IJ SOLN: 0.2000 mg | Freq: Once | INTRAMUSCULAR | Status: DC | PRN

## 2016-09-25 MED ORDER — DEXAMETHASONE SODIUM PHOSPHATE 10 MG/ML IJ SOLN
INTRAMUSCULAR | Status: DC | PRN
  Administered 2016-09-25: 10 mg via INTRAVENOUS

## 2016-09-25 MED ORDER — PROPOFOL 500 MG/50ML IV EMUL
INTRAVENOUS | Status: AC
  Filled 2016-09-25: qty 50

## 2016-09-25 MED ORDER — CEFAZOLIN SODIUM-DEXTROSE 2-4 GM/100ML-% IV SOLN: 2.0000 g | INTRAVENOUS | Status: DC

## 2016-09-25 MED ORDER — LACTATED RINGERS IV SOLN
INTRAVENOUS | Status: DC | PRN
  Administered 2016-09-25 (×2): via INTRAVENOUS

## 2016-09-25 MED ORDER — PHENYLEPHRINE 40 MCG/ML (10ML) SYRINGE FOR IV PUSH (FOR BLOOD PRESSURE SUPPORT)
PREFILLED_SYRINGE | INTRAVENOUS | Status: AC
  Filled 2016-09-25: qty 10

## 2016-09-25 MED ORDER — FENTANYL CITRATE (PF) 100 MCG/2ML IJ SOLN
INTRAMUSCULAR | Status: AC
  Filled 2016-09-25: qty 2

## 2016-09-25 MED ORDER — LIDOCAINE 2% (20 MG/ML) 5 ML SYRINGE
INTRAMUSCULAR | Status: AC
  Filled 2016-09-25: qty 5

## 2016-09-25 MED ORDER — ONDANSETRON HCL 4 MG/2ML IJ SOLN
INTRAMUSCULAR | Status: AC
  Filled 2016-09-25: qty 2

## 2016-09-25 SURGICAL SUPPLY — 59 items
BLADE EAR TYMPAN 2.5 60D BEAV (BLADE) ×3 IMPLANT
BLADE SURG 15 STRL LF DISP TIS (BLADE) ×1 IMPLANT
BLADE SURG 15 STRL SS (BLADE) ×2
BNDG COHESIVE 3X5 TAN STRL LF (GAUZE/BANDAGES/DRESSINGS) ×3 IMPLANT
BNDG GAUZE ELAST 4 BULKY (GAUZE/BANDAGES/DRESSINGS) ×3 IMPLANT
BUR FULL RADIUS 2.9 (BURR) ×2 IMPLANT
BUR FULL RADIUS 2.9MM (BURR) ×1
BUR GATOR 2.9 (BURR) ×2 IMPLANT
BUR GATOR 2.9MM (BURR) ×1
CANISTER SUCT 1200ML W/VALVE (MISCELLANEOUS) ×3 IMPLANT
CHLORAPREP W/TINT 26ML (MISCELLANEOUS) ×3 IMPLANT
COVER BACK TABLE 60X90IN (DRAPES) ×3 IMPLANT
COVER MAYO STAND STRL (DRAPES) ×3 IMPLANT
CUFF TOURNIQUET SINGLE 18IN (TOURNIQUET CUFF) ×3 IMPLANT
DRAPE EXTREMITY T 121X128X90 (DRAPE) ×3 IMPLANT
DRAPE IMP U-DRAPE 54X76 (DRAPES) ×3 IMPLANT
DRAPE SURG 17X23 STRL (DRAPES) ×3 IMPLANT
GAUZE SPONGE 4X4 12PLY STRL (GAUZE/BANDAGES/DRESSINGS) ×3 IMPLANT
GAUZE XEROFORM 1X8 LF (GAUZE/BANDAGES/DRESSINGS) ×3 IMPLANT
GLOVE BIO SURGEON STRL SZ7 (GLOVE) ×3 IMPLANT
GLOVE BIO SURGEON STRL SZ7.5 (GLOVE) ×3 IMPLANT
GLOVE BIOGEL PI IND STRL 7.0 (GLOVE) ×3 IMPLANT
GLOVE BIOGEL PI IND STRL 7.5 (GLOVE) ×1 IMPLANT
GLOVE BIOGEL PI IND STRL 8 (GLOVE) ×1 IMPLANT
GLOVE BIOGEL PI IND STRL 8.5 (GLOVE) ×1 IMPLANT
GLOVE BIOGEL PI INDICATOR 7.0 (GLOVE) ×6
GLOVE BIOGEL PI INDICATOR 7.5 (GLOVE) ×2
GLOVE BIOGEL PI INDICATOR 8 (GLOVE) ×2
GLOVE BIOGEL PI INDICATOR 8.5 (GLOVE) ×2
GLOVE ECLIPSE 6.5 STRL STRAW (GLOVE) ×6 IMPLANT
GLOVE SURG ORTHO 8.0 STRL STRW (GLOVE) ×3 IMPLANT
GOWN STRL REUS W/ TWL LRG LVL3 (GOWN DISPOSABLE) ×4 IMPLANT
GOWN STRL REUS W/TWL LRG LVL3 (GOWN DISPOSABLE) ×8
GOWN STRL REUS W/TWL XL LVL3 (GOWN DISPOSABLE) ×3 IMPLANT
IV NS IRRIG 3000ML ARTHROMATIC (IV SOLUTION) ×3 IMPLANT
IV SET EXT 30 76VOL 4 MALE LL (IV SETS) ×3 IMPLANT
NDL SAFETY ECLIPSE 18X1.5 (NEEDLE) ×3 IMPLANT
NEEDLE HYPO 18GX1.5 SHARP (NEEDLE) ×6
NEEDLE HYPO 22GX1.5 SAFETY (NEEDLE) ×3 IMPLANT
NEEDLE SPNL 18GX3.5 QUINCKE PK (NEEDLE) IMPLANT
PACK BASIN DAY SURGERY FS (CUSTOM PROCEDURE TRAY) ×3 IMPLANT
PAD CAST 3X4 CTTN HI CHSV (CAST SUPPLIES) ×1 IMPLANT
PADDING CAST ABS 3INX4YD NS (CAST SUPPLIES) ×2
PADDING CAST ABS 4INX4YD NS (CAST SUPPLIES) ×2
PADDING CAST ABS COTTON 3X4 (CAST SUPPLIES) ×1 IMPLANT
PADDING CAST ABS COTTON 4X4 ST (CAST SUPPLIES) ×1 IMPLANT
PADDING CAST COTTON 3X4 STRL (CAST SUPPLIES) ×2
SET ARTHROSCOPY TUBING (MISCELLANEOUS) ×2
SET ARTHROSCOPY TUBING LN (MISCELLANEOUS) ×1 IMPLANT
SLEEVE SCD COMPRESS KNEE MED (MISCELLANEOUS) ×3 IMPLANT
SPLINT PLASTER CAST XFAST 3X15 (CAST SUPPLIES) ×5 IMPLANT
SPLINT PLASTER XTRA FASTSET 3X (CAST SUPPLIES) ×10
STOCKINETTE 4X48 STRL (DRAPES) ×3 IMPLANT
SUT ETHILON 4 0 PS 2 18 (SUTURE) ×3 IMPLANT
SUT MERSILENE 4 0 P 3 (SUTURE) ×3 IMPLANT
SYR BULB 3OZ (MISCELLANEOUS) ×3 IMPLANT
SYR CONTROL 10ML LL (SYRINGE) ×3 IMPLANT
UNDERPAD 30X30 (UNDERPADS AND DIAPERS) ×3 IMPLANT
WATER STERILE IRR 1000ML POUR (IV SOLUTION) ×3 IMPLANT

## 2016-09-25 NOTE — Progress Notes (Signed)
Assisted Dr. Massagee with right, ultrasound guided, interscalene  block. Side rails up, monitors on throughout procedure. See vital signs in flow sheet. Tolerated Procedure well. 

## 2016-09-25 NOTE — Brief Op Note (Signed)
09/25/2016  12:48 PM  PATIENT:  Lise Auer  45 y.o. female  PRE-OPERATIVE DIAGNOSIS:  triangular fibrocartilage complex lunotriquetral tear left wrist  POST-OPERATIVE DIAGNOSIS:  triangular fibrocartilage complex lunotriquetral tear left wrist  PROCEDURE:  Procedure(s): Right WRIST ARTHROSCOPY WITH FOVEAL TRIANGULAR FIBROCARTILAGE COMPLEX REPAIR (Right)  SURGEON:  Surgeon(s) and Role:    * Daryll Brod, MD - Primary  PHYSICIAN ASSISTANT:   ASSISTANTS: none   ANESTHESIA:   regional and general  EBL:  No intake/output data recorded.  BLOOD ADMINISTERED:none  DRAINS: none   LOCAL MEDICATIONS USED:  NONE  SPECIMEN:  No Specimen  DISPOSITION OF SPECIMEN:  N/A  COUNTS:  YES  TOURNIQUET:    DICTATION: .Other Dictation: Dictation Number [id number]  PLAN OF CARE: Discharge to home after PACU  PATIENT DISPOSITION:  PACU - hemodynamically stable.

## 2016-09-25 NOTE — Anesthesia Postprocedure Evaluation (Signed)
Anesthesia Post Note  Patient: [name]  Procedure(s) Performed: Procedure(s) (LRB): Right WRIST ARTHROSCOPY WITH FOVEAL TRIANGULAR FIBROCARTILAGE COMPLEX REPAIR (Right)  Patient location during evaluation: PACU Anesthesia Type: General Level of consciousness: awake and alert Pain management: pain level controlled Vital Signs Assessment: post-procedure vital signs reviewed and stable Respiratory status: spontaneous breathing, nonlabored ventilation and respiratory function stable Cardiovascular status: blood pressure returned to baseline and stable Postop Assessment: no signs of nausea or vomiting Anesthetic complications: no    Last Vitals:  Vitals:   09/25/16 1345 09/25/16 1415  BP: 138/89 127/83  Pulse: (!) 104 95  Resp: 13 16  Temp:  37.1 C    Last Pain:  Vitals:   09/25/16 1105  TempSrc: Oral                 [name] A

## 2016-09-25 NOTE — Transfer of Care (Signed)
Immediate Anesthesia Transfer of Care Note  Patient: [name]  Procedure(s) Performed: Procedure(s): Right WRIST ARTHROSCOPY WITH FOVEAL TRIANGULAR FIBROCARTILAGE COMPLEX REPAIR (Right)  Patient Location: PACU  Anesthesia Type:GA combined with regional for post-op pain  Level of Consciousness: awake, alert  and oriented  Airway & Oxygen Therapy: Patient Spontanous Breathing and Patient connected to face mask oxygen  Post-op Assessment: Report given to RN and Post -op Vital signs reviewed and stable  Post vital signs: Reviewed and stable  Last Vitals:  Vitals:   09/25/16 1105  BP: 137/84  Pulse: 67  Resp: 20  Temp: 36.8 C    Last Pain:  Vitals:   09/25/16 1105  TempSrc: Oral         Complications: No apparent anesthesia complications

## 2016-09-25 NOTE — Anesthesia Procedure Notes (Signed)
Procedure Name: Intubation Date/Time: 09/25/2016 11:47 AM Performed by: Lamel Mccarley D Pre-anesthesia Checklist: Patient identified, Emergency Drugs available, Suction available and Patient being monitored Patient Re-evaluated:Patient Re-evaluated prior to inductionOxygen Delivery Method: Circle system utilized Preoxygenation: Pre-oxygenation with 100% oxygen Intubation Type: IV induction Ventilation: Mask ventilation without difficulty Laryngoscope Size: Mac and 3 Grade View: Grade I Tube type: Oral Tube size: 7.0 mm Number of attempts: 1 Airway Equipment and Method: Stylet and Oral airway Placement Confirmation: ETT inserted through vocal cords under direct vision,  positive ETCO2 and breath sounds checked- equal and bilateral Secured at: 21 cm Tube secured with: Tape Dental Injury: Teeth and Oropharynx as per pre-operative assessment

## 2016-09-25 NOTE — Op Note (Signed)
Dictation Number [id number]

## 2016-09-25 NOTE — H&P (Signed)
[name] is an 45 y.o. female.   Chief Complaint:right wrist pain HPI: [name] is a 45 year old right-hand-dominant female referred by Dr. Amil Amen for consultation regarding pain in her right wrist. This been going on since June when she sustained a twisting injury when she was reaching behind her. She complains of pain on the ulnar aspect of her right wrist. She came complains of swelling in that area. She has no prior history of injury. She complains of a toothache type pain with a VAS score of 9/10. She states not using it helps with that she has worn a splint which has helped she states that twisting her wrist causes pain for her. She has tried occasional ibuprofen which has given her some relief of symptoms. She localizes the pain directly over the tip of the right ulnar styloid. He states none use makes it better. She has no history of diabetes thyroid problems arthritis or gout. Family history is negative for each of these also. She states her splint has helped with her discomfort.She has had her MRI done at [facility] and read by Dr. Pollyann Kennedy. She brought out as a significant TFCC tear degenerative with a questionable lunotriquetral tear.      History reviewed. No pertinent past medical history.  Past Surgical History:  Procedure Laterality Date  . KNEE SURGERY       Past Medical History:  Diagnosis Date  . Allergy    Environmental and seasonal  . Amniotic fluid embolism 2004   history 2004 delivery resolved  . Anemia    hx  . Chronic headaches    otc med prn  . Depression    Treated with Celexa in past.    . Gastritis   . GERD (gastroesophageal reflux disease)    diet controlled -no meds  . History of blood transfusion 2004   [place]   . Hypertension 2004   Hx PIH with 2004 preg - resolved  . Hypothyroidism    hx - but resolved  . Irritable bowel syndrome   . Myocardial infarction 2004   DIC, hypertension during 2004 delivery, no problems since  .  Respiratory failure, acute ([place]) 2004   hx 2004 w preg - resolved  . SVD (spontaneous vaginal delivery)    x 5    Past Surgical History:  Procedure Laterality Date  . CARDIAC CATHETERIZATION  2004   test normal - no blockages; Reportedly from DIC?  . DILATION AND CURETTAGE OF UTERUS  11/2013   Novasure radiofrequency ablation  . HYSTEROSCOPY WITH NOVASURE N/A 11/27/2013   Procedure: HYSTEROSCOPY WITH NOVASURE;  Surgeon: Lavonia Drafts, MD;  Location: [facility];  Service: Gynecology;  Laterality: N/A;  . KNEE ARTHROSCOPY     Right knee  . TUBAL LIGATION      Family History  Problem Relation Age of Onset  . Arthritis Father   . Hypertension Father   . Depression Brother   . Allergies Son   . Prostate cancer Paternal Uncle   . Stomach cancer Paternal Grandmother    Social History:  reports that she has never smoked. She has never used smokeless tobacco. She reports that she drinks alcohol. She reports that she does not use drugs.  Allergies: No Known Allergies  No prescriptions prior to admission.    No results found for this or any previous visit (from the past 48 hour(s)).  No results found.   Pertinent items are noted in HPI.  Height 5\' 1"  (1.549 m), weight 70.3  kg (155 lb), last menstrual period 09/18/2016.  General appearance: alert, cooperative and appears stated age Head: Normocephalic, without obvious abnormality, atraumatic Neck: no JVD Resp: clear to auscultation bilaterally Cardio: regular rate and rhythm, S1, S2 normal, no murmur, click, rub or gallop GI: soft, non-tender; bowel sounds normal; no masses,  no organomegaly Extremities: ulnar wrist pain right Pulses: 2+ and symmetric Skin: Skin color, texture, turgor normal. No rashes or lesions Neurologic: Grossly normal Incision/Wound: na  Assessment/Plan Assessment:  1. Abutment syndrome of wrist, right  2. TFCC (triangular fibrocartilage complex) injury, right,  3. Right wrist pain     Plan: Her MRI is reviewed personally. Dr. Olegario Shearer report is reviewed. MRI images are reviewed with her. We have discussed possibility that this can actually be an ulnocarpal abutment. We discussed possibility of arthroscopic inspection debridement TFCC and LT as dictated by findings possible shrinkage. She is advised should this not resolve symptoms for than on the ulnar shortening osteotomy may be required to relieve her pain. She is in agreement with this approach. She does not desire having an ulnar shortening osteotomy done being done primarily. She is aware that there is no guarantee to the surgery the possibility of infection recurrence injury to arteries nerves tendons incomplete relief of symptoms and dystrophy all discussed with her. She is scheduled for arthroscopic inspection debridement possible shrinkage right wrist TFCC LT as dictated by findings. Questions are encouraged and answered to her satisfaction.      Robecca Fulgham R 09/25/2016, 9:17 AM

## 2016-09-25 NOTE — Anesthesia Procedure Notes (Signed)
Anesthesia Regional Block:  Supraclavicular block  Pre-Anesthetic Checklist: ,, timeout performed, Correct Patient, Correct Site, Correct Laterality, Correct Procedure, Correct Position, site marked, Risks and benefits discussed,  Surgical consent,  Pre-op evaluation,  At surgeon's request and post-op pain management  Laterality: Right  Prep: chloraprep       Needles:   Needle Type: Echogenic Stimulator Needle     Needle Length: 9cm 9 cm Needle Gauge: 22 and 22 G  Needle insertion depth: 4 cm   Additional Needles:  Procedures: ultrasound guided (picture in chart) and nerve stimulator Supraclavicular block Narrative:  Start time: 09/25/2016 11:20 AM End time: 09/25/2016 11:35 AM Injection made incrementally with aspirations every 5 mL.  Performed by: Personally  Anesthesiologist: Sheniah Supak  Additional Notes: Tolerated well

## 2016-09-25 NOTE — Discharge Instructions (Addendum)
[facility] Instructions Hand Surgery  Wound Care: Keep your hand elevated above the level of your heart.  Do not allow it to dangle by your side.  Keep the dressing dry and do not remove it unless your doctor advises you to do so.  He will usually change it at the time of your post-op visit.  Moving your fingers is advised to stimulate circulation but will depend on the site of your surgery.  If you have a splint applied, your doctor will advise you regarding movement.  Activity: Do not drive or operate machinery today.  Rest today and then you may return to your normal activity and work as indicated by your physician.  Diet:  Drink liquids today or eat a light diet.  You may resume a regular diet tomorrow.    General expectations: Pain for two to three days. Fingers may become slightly swollen.  Call your doctor if any of the following occur: Severe pain not relieved by pain medication. Elevated temperature. Dressing soaked with blood. Inability to move fingers. White or bluish color to fingers.  Regional Anesthesia Blocks  1. Numbness or the inability to move the "blocked" extremity may last from 3-48 hours after placement. The length of time depends on the medication injected and your individual response to the medication. If the numbness is not going away after 48 hours, call your surgeon.  2. The extremity that is blocked will need to be protected until the numbness is gone and the  Strength has returned. Because you cannot feel it, you will need to take extra care to avoid injury. Because it may be weak, you may have difficulty moving it or using it. You may not know what position it is in without looking at it while the block is in effect.  3. Bruising and tenderness at the needle site are common side effects and will resolve in a few days.  4. Persistent numbness or new problems with movement should be communicated to the surgeon or the [facility]  [phone] [place] [phone]).   Post Anesthesia Home Care Instructions  Activity: Get plenty of rest for the remainder of the day. A responsible adult should stay with you for 24 hours following the procedure.  For the next 24 hours, DO NOT: -Drive a car -Paediatric nurse -Drink alcoholic beverages -Take any medication unless instructed by your physician -Make any legal decisions or sign important papers.  Meals: Start with liquid foods such as gelatin or soup. Progress to regular foods as tolerated. Avoid greasy, spicy, heavy foods. If nausea and/or vomiting occur, drink only clear liquids until the nausea and/or vomiting subsides. Call your physician if vomiting continues.  Special Instructions/Symptoms: Your throat may feel dry or sore from the anesthesia or the breathing tube placed in your throat during surgery. If this causes discomfort, gargle with warm salt water. The discomfort should disappear within 24 hours.  If you had a scopolamine patch placed behind your ear for the management of post- operative nausea and/or vomiting:  1. The medication in the patch is effective for 72 hours, after which it should be removed.  Wrap patch in a tissue and discard in the trash. Wash hands thoroughly with soap and water. 2. You may remove the patch earlier than 72 hours if you experience unpleasant side effects which may include dry mouth, dizziness or visual disturbances. 3. Avoid touching the patch. Wash your hands with soap and water after contact with the  patch. °   ° ° °

## 2016-09-25 NOTE — Anesthesia Preprocedure Evaluation (Signed)
Anesthesia Evaluation  Patient identified by MRN, date of birth, ID band Patient awake    Reviewed: Allergy & Precautions, NPO status , Patient's Chart, lab work & pertinent test results  History of Anesthesia Complications Negative for: history of anesthetic complications  Airway Mallampati: I  TM Distance: >3 FB Neck ROM: Full    Dental  (+) Teeth Intact, Dental Advisory Given   Pulmonary neg pulmonary ROS,    breath sounds clear to auscultation       Cardiovascular hypertension, negative cardio ROS   Rhythm:Regular Rate:Normal     Neuro/Psych  Headaches,    GI/Hepatic GERD  ,  Endo/Other  Hypothyroidism   Renal/GU      Musculoskeletal   Abdominal   Peds  Hematology  (+) anemia ,   Anesthesia Other Findings   Reproductive/Obstetrics                             Anesthesia Physical Anesthesia Plan  ASA: II  Anesthesia Plan: General   Post-op Pain Management: GA combined w/ Regional for post-op pain   Induction: Intravenous  Airway Management Planned: LMA  Additional Equipment:   Intra-op Plan:   Post-operative Plan: Extubation in OR  Informed Consent: I have reviewed the patients History and Physical, chart, labs and discussed the procedure including the risks, benefits and alternatives for the proposed anesthesia with the patient or authorized representative who has indicated his/her understanding and acceptance.   Dental advisory given  Plan Discussed with: CRNA  Anesthesia Plan Comments:         Anesthesia Quick Evaluation

## 2016-09-26 ENCOUNTER — Encounter (HOSPITAL_BASED_OUTPATIENT_CLINIC_OR_DEPARTMENT_OTHER): Payer: Self-pay | Admitting: Orthopedic Surgery

## 2016-09-26 NOTE — Op Note (Signed)
[name], [name]            ACCOUNT NO.:  [id number]  MEDICAL RECORD NO.:  [id number]  LOCATION:                                 FACILITY:  PHYSICIAN:  Daryll Brod, M.D.            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  OPERATION:  Arthroscopy with debridement of triangular fibrocartilage complex, lunotriquetral joint, right wrist.  SURGEON:  Daryll Brod, M.D.  ASSISTANT:  None.  ANESTHESIA:  Supraclavicular block, general.  PLACE OF SURGERY:  [name].  ANESTHESIOLOGIST:  [name], M.D.  HISTORY:  The patient is a 45 year old female with a history of ulnar- sided wrist pain.  This followed a twisting injury.  She has not responded to conservative treatment.  MRI reveals a long ulna with TFCC tear, lunotriquetral injury.  We have discussed ulnar shortening osteotomy with her, she has declined having that done at the present time, would like to have a simple debridement performed in effort to see if this will help relief the symptoms for her.  Pre, peri, and postoperative course have been discussed along with risks and complications.  She is aware that there was no guarantee to the surgery; the possibility of infection; recurrence of injury to arteries, nerves, tendons; incomplete relief of symptoms and dystrophy.  She was advised that the optimal treatment for this is ulnar shortening osteotomy versus simple debridement.  DESCRIPTION OF PROCEDURE:  The patient was seen in the preoperative area, the extremity marked by both patient and surgeon.  Antibiotic given.  The patient was brought to the operating room, where a  general anesthetic was carried out without difficulty.  She had been given a supraclavicular block in the preoperative area.  She was prepped using ChloraPrep, supine position with the right arm free.  A 3-minute dry time was allowed, and time-out taken, confirming the patient and procedure.  The limb was placed in the Arc arthroscopy tower.  A 10 pounds of traction was applied.  The joint was inflated through the 3-4 portal.  A transverse incision was made, deepened with a hemostat, and blunt trocar was used to enter the joint.  The joint was inspected.  The volar radial wrist ligaments were intact.  The proximal aspect of the scapholunate ligament showed no changes.  The proximal lunate showed no significant changes.  A tear of the triangular fibrocartilage was noted. This was a jagged central-type injury.  An irrigation catheter was placed in 6U and a 4-5 portal opened after localization with a 22-gauge needle.  Transverse incision was made, deepened with a hemostat, and a blunt trocar was used to enter the joint.  A probe was inserted allowing palpation of the TFCC injury.  A full radius shaver followed by Gator shaver were then used to remove the torn ends of the triangular fibrocartilage complex.  A Beaver blade was then inserted, this was for eye surgery and used  to remove the central aspect of the TFCC leaving the dorsal and palmar rings attached along with the attachment to the fovea.  Further debridement was then performed with a full radius shaver.  The midcarpal joint was then inspected through the ulnar midcarpal portal after localization with a 22-gauge needle.  The transverse incision made, deepened with a hemostat.  A blunt trocar was used to enter the joint.  The scope was introduced, allowing inspection of the midcarpal joint.  The lunotriquetral joint was unstable.  There were no changes on the proximal aspect of the hamate, capitate, distal aspect of the scaphoid,  lunate or triquetrum.  Scapholunate ligament did not show any instability.  The STT joint was then able to be visualized bringing the scope up into the STT joint.  This showed very minimal changes on the articular surface of the distal pole of the scaphoid. The scope was removed.  The proximal ulnar portal was then used to visualize the lunotriquetral tear, which was virtually complete.  The scope was reintroduced in the 3-4 portal.  The smoothing of the debridement of the triangular fibrocartilage complex was completed.  The instruments were removed.  The portals were closed with interrupted 4-0 nylon sutures.  A compressive dressing volar splint was applied. Tourniquet was not inflated throughout the entire procedure.  The patient tolerated the procedure well, was taken to the recovery room for observation in satisfactory condition.  She will be discharged to home to return to the [facility] in 1 week, on [place].          ______________________________ Daryll Brod, M.D.     GK/MEDQ  D:  09/25/2016  T:  09/26/2016  Job:  [id number]

## 2016-11-08 ENCOUNTER — Ambulatory Visit (INDEPENDENT_AMBULATORY_CARE_PROVIDER_SITE_OTHER): Payer: Medicaid Other | Admitting: Internal Medicine

## 2016-11-08 ENCOUNTER — Encounter: Payer: Self-pay | Admitting: Internal Medicine

## 2016-11-08 VITALS — BP 122/82 | HR 70 | Resp 12 | Ht 61.0 in | Wt 153.0 lb

## 2016-11-08 DIAGNOSIS — Z23 Encounter for immunization: Secondary | ICD-10-CM | POA: Diagnosis not present

## 2016-11-08 MED ORDER — TRAZODONE HCL 50 MG PO TABS: 50.0000 mg | ORAL_TABLET | Freq: Every day | ORAL | 11 refills | Status: DC

## 2016-11-08 MED ORDER — FLUOXETINE HCL 40 MG PO CAPS: 40.0000 mg | ORAL_CAPSULE | Freq: Every day | ORAL | 11 refills | Status: DC

## 2016-11-08 NOTE — Progress Notes (Signed)
   Subjective:    Patient ID: [name], female    DOB: [birth year], 45 y.o.   MRN: [id number]  HPI   1.  HM:  Did get mammogram:  Cysts either resolved or stable.  To recheck yearly. Has not brought back stool cards for colon cancer screening.  Needs flu vaccine today.   2.  Depression:  Feels she is doing well with depression.  Has been unable to make an appt due to transportation issues with Victoria Burows, LCSW.  Discussed home visits are a possibility.  Depression screen [facility] 2/9 11/08/2016 08/08/2016 07/04/2016 06/08/2016 01/13/2016  Decreased Interest 2 2 3 2 2   Down, Depressed, Hopeless 2 1 1 2 3   PHQ - 2 Score 4 3 4 4 5   Altered sleeping 3 2 1 2 3   Tired, decreased energy 3 2 2 2 3   Change in appetite 3 2 1 2 2   Feeling bad or failure about yourself  2 1 1 2 2   Trouble concentrating 2 1 1 1 2   Moving slowly or fidgety/restless 3 1 0 0 1  Suicidal thoughts 2 0 1 1 1   PHQ-9 Score 22 12 11 14 19   Difficult doing work/chores - Somewhat difficult Somewhat difficult Somewhat difficult Somewhat difficult   3.  Insomnia:  Taking otc med every night to sleep.  Is still taking Trazodone as needed for some reason.  Discussed she should use it for sleep instead and sounds like needed nightly.  Not sure why not in med list--started prior to this EHR for our patients.  4. Right Wrist pain :  Underwent surgery with Dr. Fredna Dow.  Home strengthening program.  By last ortho note, appears to be improved and healing well, though not clear patient feels this way.  Current Meds  Medication Sig  . fluticasone (FLONASE) 50 MCG/ACT nasal spray Place 2 sprays into both nostrils daily.  Marland Kitchen ibuprofen (ADVIL,MOTRIN) 600 MG tablet Take 1 tablet (600 mg total) by mouth every 8 (eight) hours as needed (with food).  . Multiple Vitamin (MULTIVITAMIN WITH MINERALS) TABS Take 1 tablet by mouth daily.  . [DISCONTINUED] FLUoxetine (PROZAC) 20 MG capsule Take 1 capsule (20 mg total) by mouth daily.   No Known  Allergies  Review of Systems     Objective:   Physical Exam  Poor eye contact.  Not tearful Lungs:  CTA CV:  RRR without murmur or rub, radial and DP pulses normal and equal Right hand in splint.        Assessment & Plan:  1.  HM:  Influenza vaccine.  Also asked patient to get her stool cards in.  2.  Depression:  Very concerned with significant increase in PHQ9 score.  Discussed with patient and Victoria Burows, LCSW.  Looking for home visits, whether with [name] or another counselor. Discussed again that should be taking Trazodone 50 mg regularly at night and not taking the OTC benadryl sleep aid.  3.  Insomnia:  As above.  Will address other ways to improve sleep at follow up in 2 weeks as well.

## 2016-11-15 ENCOUNTER — Other Ambulatory Visit (INDEPENDENT_AMBULATORY_CARE_PROVIDER_SITE_OTHER): Payer: Self-pay | Admitting: Licensed Clinical Social Worker

## 2016-11-15 DIAGNOSIS — F331 Major depressive disorder, recurrent, moderate: Secondary | ICD-10-CM

## 2016-11-16 NOTE — Progress Notes (Signed)
   THERAPY PROGRESS NOTE  Session Time: 2min  Participation Level: Active  Behavioral Response: Neat and Well GroomedLethargicDepressed  Type of Therapy: Individual Therapy  Treatment Goals addressed: Coping  Interventions: Strength-based and Supportive  Summary: [name] is a 45 y.o. female who presents with a depressed mood and appropriate affect. She reported that she has continued to grieve and feel depressed but she has noticed some occasional improvements in her mood. She shared that she still cries often but is also able to feel joy at times. She expressed that her main source of joy is her children. She shared that Thanksgiving was a happy time because she cooked for a full house of children and friends. She expressed hope that Christmas will also be a joyful time for her family. [name] shared her frustration that her husband's murder has still not be solved. She reported that she continues to call the detective and stay in touch. [name] agreed that she needs to engage in more activities as a way to cope. She stated that she is interested in getting a job as soon as she gets her truck fixed.  Suicidal/Homicidal: Nowithout intent/plan  Therapist Response: LCSW utilized supportive counseling techniques throughout the session in order to validate emotions and encourage open expression of emotion. LCSW asked [name] to review her current coping skills and commit to engaging in several more. LCSW assigned "homework" of exercising three days per week and singing one day per week.  Plan: Return again in 3 weeks.  Diagnosis: Axis I: See current hospital problem list    Axis II: No diagnosis    Metta Clines, LCSW 11/16/2016

## 2016-11-22 ENCOUNTER — Ambulatory Visit: Payer: Medicaid Other | Admitting: Internal Medicine

## 2016-12-11 ENCOUNTER — Encounter: Payer: Self-pay | Admitting: Internal Medicine

## 2016-12-11 ENCOUNTER — Ambulatory Visit (INDEPENDENT_AMBULATORY_CARE_PROVIDER_SITE_OTHER): Payer: Medicaid Other | Admitting: Internal Medicine

## 2016-12-11 VITALS — BP 130/88 | HR 76 | Resp 12 | Ht 61.75 in | Wt 152.0 lb

## 2016-12-11 DIAGNOSIS — B9689 Other specified bacterial agents as the cause of diseases classified elsewhere: Secondary | ICD-10-CM | POA: Diagnosis not present

## 2016-12-11 DIAGNOSIS — F329 Major depressive disorder, single episode, unspecified: Secondary | ICD-10-CM

## 2016-12-11 DIAGNOSIS — F32A Depression, unspecified: Secondary | ICD-10-CM

## 2016-12-11 DIAGNOSIS — N760 Acute vaginitis: Secondary | ICD-10-CM

## 2016-12-11 DIAGNOSIS — G4700 Insomnia, unspecified: Secondary | ICD-10-CM | POA: Diagnosis not present

## 2016-12-11 MED ORDER — METRONIDAZOLE 500 MG PO TABS: 500.0000 mg | ORAL_TABLET | Freq: Two times a day (BID) | ORAL | 0 refills | Status: DC

## 2016-12-11 MED ORDER — METRONIDAZOLE 0.75 % VA GEL: VAGINAL | 6 refills | Status: DC

## 2016-12-11 NOTE — Progress Notes (Signed)
   Subjective:    Patient ID: [name], female    DOB: 1971/04/24, 46 y.o.   MRN: [id number]  HPI   1.  Depression:  Last visit with score of 22 on PHQ9  Was having significant problems with insomnia, but was using Trazodone only intermittently and not necessarily at bedtime.   Is getting to where she does not want to leave the home, but does feel her depression is improved.  Did increase her Fluoxetine to 40 mg daily from 20 mg and feels that has also helped.  Has been on this dose just over 1 month.    She is taking the Trazodone 50 mg every night and is sleeping 6 hours and feels refreshed in the morning with good energy.   Does go to her children's activities, but that is limited. Trying to work on exercise as recommended by [name], LCSW.  Has had one session with her and another planned in 3 days.   No suicidal ideation. Is concerned the Fluoxetine may be affecting her libido. Has been a problem in the last 6 + months.  Discussed it could, but so could the depression.  She has taken Wellbutrin in the past short term, but cannot recall why it was stopped, though possibly due to anxiety with the med.    2.  Vaginal Discharge:  Had intercourse with person 2-3 days ago.  Noted a mild odor and clear discharge subsequently.  Did use a condom.  This is someone with whom she is monogamous. She does state she was bleeding minmally after intercourse. Has happened in the past.   No pain with intercourse, however.   Current Meds  Medication Sig  . FLUoxetine (PROZAC) 40 MG capsule Take 1 capsule (40 mg total) by mouth daily.  . fluticasone (FLONASE) 50 MCG/ACT nasal spray Place 2 sprays into both nostrils daily.  Marland Kitchen ibuprofen (ADVIL,MOTRIN) 600 MG tablet Take 1 tablet (600 mg total) by mouth every 8 (eight) hours as needed (with food).  . traZODone (DESYREL) 50 MG tablet Take 1 tablet (50 mg total) by mouth at bedtime.       Review of Systems     Objective:   Physical Exam    LUngs:  CTA CV: RRR without murmur or rub Pelvic:  No CMT, No adnexal or uterine mass or tenderness.  Gray yellow discharge with amine odor, No cervical lesion or inflammation noted. Wet prep + for BV and whiff +    Assessment & Plan:  1.  Depression:  Continue to work with [name], LCSW.  Somewhat better on 40 mg of Fluoxetine and Trazodone for sleep.   Encouraged goals to get out of the house and into activities.   She is having difficulties with libido but does not want to change Fluoxetine or other meds at this time. Discussed the possibility of adding Wellbutrin low dose in the future if this continues to be a problem  2.  Recurrent BV:  Unable to get less costly boric acid vaginal suppositories.  Will treat with oral Metronidazole for 7 days, then vaginal Metrogel twice weekly for 6 months.  Follow up in 3 months.

## 2016-12-14 ENCOUNTER — Other Ambulatory Visit: Payer: Medicaid Other | Admitting: Licensed Clinical Social Worker

## 2016-12-14 DIAGNOSIS — F331 Major depressive disorder, recurrent, moderate: Secondary | ICD-10-CM

## 2016-12-14 NOTE — Progress Notes (Signed)
   THERAPY PROGRESS NOTE  Session Time: 78min  Participation Level: Active  Behavioral Response: Casual and Well GroomedAlertDepressed  Type of Therapy: Individual Therapy  Treatment Goals addressed: Coping  Interventions: Strength-based and Supportive  Summary: [name] is a 46 y.o. female who presents with a depressed mood and appropriate affect. She reported that the holidays were difficult for her because she chose to spend them alone, sending her children to be with her adult daughter. She shared that she "broke down crying" several times over the holidays when she thought about her husband who died. She shared that overall, she feels that she is making progress in her grief process because she is getting more functional, especially in being able to think about her husband without crying. [name] reported that she feels her children are doing well with their grief, and often talk about their father in loving ways. She shared that she had started doing exercise, which felt good, until it got cold. She committed to staying more physically active inside the house. She shared about her vision of her future, which includes more singing and returning to church. [name] and LCSW processed about the obstacles and worries that have kept her from going back to church; she fears crying in front of church members or being annoyed by their questions.   Suicidal/Homicidal: Nowithout intent/plan  Therapist Response: LCSW utilized supportive counseling techniques throughout the session in order to validate emotions and encourage open expression of emotion. LCSW and Sergio discussed her current grief reactions and her coping skills. LCSW provided affirmations to [name] for trying to stay more active and being more social. LCSW and [name] problem-solved regarding how she can tackle her worries in regards to returning to church.   Plan: Return again in 3 weeks.  Diagnosis: Axis I: See current  hospital problem list    Axis II: No diagnosis    Metta Clines, LCSW 12/14/2016

## 2017-01-04 ENCOUNTER — Other Ambulatory Visit: Payer: Medicaid Other | Admitting: Licensed Clinical Social Worker

## 2017-01-11 ENCOUNTER — Other Ambulatory Visit: Payer: Medicaid Other | Admitting: Licensed Clinical Social Worker

## 2017-01-25 ENCOUNTER — Other Ambulatory Visit (INDEPENDENT_AMBULATORY_CARE_PROVIDER_SITE_OTHER): Payer: Medicaid Other | Admitting: Licensed Clinical Social Worker

## 2017-01-25 DIAGNOSIS — F331 Major depressive disorder, recurrent, moderate: Secondary | ICD-10-CM | POA: Diagnosis not present

## 2017-01-28 NOTE — Progress Notes (Signed)
   THERAPY PROGRESS NOTE  Session Time: 6min  Participation Level: Active  Behavioral Response: Neat and Well GroomedAlertDepressed  Type of Therapy: Individual Therapy  Treatment Goals addressed: Coping  Interventions: Motivational Interviewing and Supportive  Summary: [name] is a 46 y.o. female who presents with a depressed mood and appropriate affect. She reported that she was glad that she had recently attended her sister's birthday party, as she tends to isolate herself. She shared about the joy and satisfaction that she has while talking with many family members that she hasn't seen in years. She reported that she has not followed through on exercising 3 times per week but that she is feeling more motivated now that the weather is improving. She shared that while she has not been writing songs, she is starting to sing while she does chores, and that this is improving her mood considerably. [name] stated that she has not been able to go to church in many weeks but is considering going soon. She shared about her grief and the slow grief process, which she feels is worse since her husband's murder is still unsolved. She reported that she had briefly started to date but realized that she was not ready for a relationship.  Suicidal/Homicidal: Nowithout intent/plan  Therapist Response: LCSW utilized supportive counseling techniques throughout the session in order to validate emotions and encourage open expression of emotion. LCSW checked in with [facility] regarding the progress that she has made towards her goals of exercising more and singing, as coping skills.  Plan: Return again in 3 weeks.  Diagnosis: Axis I: See current hospital problem list    Axis II: No diagnosis    Metta Clines, LCSW 01/28/2017

## 2017-02-15 ENCOUNTER — Other Ambulatory Visit: Payer: Medicaid Other | Admitting: Licensed Clinical Social Worker

## 2017-02-19 ENCOUNTER — Other Ambulatory Visit: Payer: Self-pay | Admitting: Orthopedic Surgery

## 2017-03-01 ENCOUNTER — Other Ambulatory Visit (INDEPENDENT_AMBULATORY_CARE_PROVIDER_SITE_OTHER): Payer: Medicaid Other | Admitting: Licensed Clinical Social Worker

## 2017-03-01 DIAGNOSIS — F331 Major depressive disorder, recurrent, moderate: Secondary | ICD-10-CM | POA: Diagnosis not present

## 2017-03-04 NOTE — Progress Notes (Signed)
   THERAPY PROGRESS NOTE  Session Time: 34min  Participation Level: Active  Behavioral Response: CasualAlertDepressed  Type of Therapy: Individual Therapy  Treatment Goals addressed: Coping  Interventions: Strength-based and Supportive  Summary: [name] is a 46 y.o. female who presents with a depressed mood and appropriate affect. She shared that she had spoken with her son about grief counseling at [facility] but he declined at this time. She reported that she will have to have another surgery on her wrist in April and she is very fearful about the long recovery time. She shared that she continues to have ups and downs with her grief but feels that she is doing better overall. She expressed happiness that she had finally attended her church Bible study, which went very well. She shared that she felt supported while in the group, and that it was not as anxiety-provoking as she had feared; she stated that she wanted to continue going to Bible study as a way to work herself up to attending regular services. [name] reported that she was exercising some and singing around the house, two of her main coping skills. She decided that when LCSW goes on maternity leave, she will use it as a "trial" to see if she is ready to be done with counseling or needs to continue.    Suicidal/Homicidal: Nowithout intent/plan  Therapist Response: LCSW utilized supportive counseling techniques throughout the session in order to validate emotions and encourage open expression of emotion. LCSW and [name] processed about her grief and current coping abilities. LCSW provided information about upcoming maternity leave in June and [facility] options for counseling services.  Plan: Return again in 3 weeks.  Diagnosis: Axis I: See current hospital problem list    Axis II: No diagnosis    Metta Clines, LCSW 03/04/2017

## 2017-03-12 ENCOUNTER — Ambulatory Visit (INDEPENDENT_AMBULATORY_CARE_PROVIDER_SITE_OTHER): Payer: Medicaid Other | Admitting: Internal Medicine

## 2017-03-12 ENCOUNTER — Encounter: Payer: Self-pay | Admitting: Internal Medicine

## 2017-03-12 VITALS — BP 110/80 | HR 86 | Resp 12 | Ht 62.0 in | Wt 148.0 lb

## 2017-03-12 DIAGNOSIS — N760 Acute vaginitis: Secondary | ICD-10-CM | POA: Diagnosis not present

## 2017-03-12 DIAGNOSIS — B3731 Acute candidiasis of vulva and vagina: Secondary | ICD-10-CM

## 2017-03-12 DIAGNOSIS — B9689 Other specified bacterial agents as the cause of diseases classified elsewhere: Secondary | ICD-10-CM | POA: Diagnosis not present

## 2017-03-12 DIAGNOSIS — B373 Candidiasis of vulva and vagina: Secondary | ICD-10-CM

## 2017-03-12 DIAGNOSIS — J3089 Other allergic rhinitis: Secondary | ICD-10-CM | POA: Diagnosis not present

## 2017-03-12 DIAGNOSIS — F32A Depression, unspecified: Secondary | ICD-10-CM

## 2017-03-12 DIAGNOSIS — F329 Major depressive disorder, single episode, unspecified: Secondary | ICD-10-CM | POA: Diagnosis not present

## 2017-03-12 LAB — POCT WET PREP WITH KOH
KOH Prep POC: NEGATIVE
Trichomonas, UA: NEGATIVE

## 2017-03-12 MED ORDER — FLUCONAZOLE 150 MG PO TABS: 150.0000 mg | ORAL_TABLET | Freq: Once | ORAL | 0 refills | Status: AC

## 2017-03-12 MED ORDER — METRONIDAZOLE 0.75 % VA GEL: VAGINAL | 6 refills | Status: DC

## 2017-03-12 MED ORDER — FEXOFENADINE HCL 180 MG PO TABS: 180.0000 mg | ORAL_TABLET | Freq: Every day | ORAL | Status: DC

## 2017-03-12 NOTE — Progress Notes (Signed)
   Subjective:    Patient ID: [name], female    DOB: [birth year], 46 y.o.   MRN: [id number]  HPI   1.  Bacterial Vaginosis:  Treated with 7 day twice daily Metronidazole followed by twice weekly vaginal Metrogel.  Has not had a problem with fishy odor or previous discharge since (a bit short of 3 months of treatment).   Has developed white, clumpy vaginal discharge.  Has mild external itching.  No odor as noted above.  Tried the 3 day vaginal yeast suppositories, which helped for a period of time, but the recurred.   Still with same partner.  They do use latex condoms.  May have some bleeding recently with intercourse.  Has had endometrial ablation in past- 2013.  2.  Depression:  Feels better.  Not interested in changing medication at this time.  Sleeping is better as well.  We had discussed switching or adding Wellbutrin for decreased libido  3.  Environmental and Seasonal Allergies:  Using Fluticasone nasal spray regularly, but still having eye and nasal symptoms.  Wondering what could use to control seasonal worsening of baseline symptoms.  Is not taking an antihistamine.  Does not want anything sedating.  Current Meds  Medication Sig  . FLUoxetine (PROZAC) 40 MG capsule Take 1 capsule (40 mg total) by mouth daily.  . fluticasone (FLONASE) 50 MCG/ACT nasal spray Place 2 sprays into both nostrils daily.  Marland Kitchen ibuprofen (ADVIL,MOTRIN) 600 MG tablet Take 1 tablet (600 mg total) by mouth every 8 (eight) hours as needed (with food).  . metroNIDAZOLE (METROGEL) 0.75 % vaginal gel 1 applicatorful intravaginally change to once  weekly for 3 more months  . Multiple Vitamin (MULTIVITAMIN WITH MINERALS) TABS Take 1 tablet by mouth daily.  . traZODone (DESYREL) 50 MG tablet Take 1 tablet (50 mg total) by mouth at bedtime.  . [DISCONTINUED] metroNIDAZOLE (METROGEL) 0.75 % vaginal gel 1 applicatorful intravaginally twice weekly for 6 months    No Known Allergies    Review of Systems       Objective:   Physical Exam  HEENT:  PERRL, EOMI conjunctivae without injection.  Mild cobbling of throat.  Nasal mucosa boggy Neck:  Supple, no adenopathy Chest:  CTA CV:  RRR Abd:  S, NT, No HSM or mass, + BS GU:  Scant thick white discharge.  Some adherent to external vaginal opening.  Minimal inflammation of vaginal mucosa.  No CMT    Wet prep:  Negative for whiff and clue cells, but does have budding yeast.     Assessment & Plan:  1.  Mild yeast vaginitis:  Fluconazole 150 mg for one dose.  Filled to have 2 other doses should this recur.  2.  Bacterial Vaginosis:  Metronidazole vaginal gel decrease to one treatment weekly for the next 3 months and see if can continue with control of BV while not causing yeast vaginitis as well.  3.  Depression/insomnia:  Controlled with current medication.  Continues with counseling.  Discussed considering staying on meds indefinitely again with history of depression prior.  4.  Environmental and seasonal allergies:  To pick up Fexofenadine 180 mg once daily for current symptoms and maintain nasal corticosteroids.

## 2017-03-21 ENCOUNTER — Encounter (HOSPITAL_COMMUNITY): Payer: Self-pay | Admitting: Emergency Medicine

## 2017-03-21 ENCOUNTER — Emergency Department (HOSPITAL_COMMUNITY)
Admission: EM | Admit: 2017-03-21 | Discharge: 2017-03-21 | Disposition: A | Discharge status: Home / Self Care | Payer: Medicaid Other | Attending: Emergency Medicine | Admitting: Emergency Medicine

## 2017-03-21 DIAGNOSIS — J0100 Acute maxillary sinusitis, unspecified: Secondary | ICD-10-CM | POA: Diagnosis not present

## 2017-03-21 DIAGNOSIS — E039 Hypothyroidism, unspecified: Secondary | ICD-10-CM | POA: Insufficient documentation

## 2017-03-21 DIAGNOSIS — R0981 Nasal congestion: Secondary | ICD-10-CM | POA: Diagnosis present

## 2017-03-21 DIAGNOSIS — I10 Essential (primary) hypertension: Secondary | ICD-10-CM | POA: Insufficient documentation

## 2017-03-21 DIAGNOSIS — I252 Old myocardial infarction: Secondary | ICD-10-CM | POA: Insufficient documentation

## 2017-03-21 DIAGNOSIS — J019 Acute sinusitis, unspecified: Secondary | ICD-10-CM

## 2017-03-21 LAB — RAPID STREP SCREEN (MED CTR MEBANE ONLY): Streptococcus, Group A Screen (Direct): NEGATIVE

## 2017-03-21 MED ORDER — AMOXICILLIN-POT CLAVULANATE 875-125 MG PO TABS: 1.0000 | ORAL_TABLET | Freq: Two times a day (BID) | ORAL | 0 refills | Status: DC

## 2017-03-21 NOTE — ED Triage Notes (Signed)
Pt comes from home with complaints of cold symptoms.  Endorses sore throat and cough. Vitals WNL. Ambulatory and A&O x4.

## 2017-03-21 NOTE — ED Triage Notes (Addendum)
Per pt she has had URI symptoms since last Tuesday.  Endorses congestion and sore throat. States her left ear feels stopped up.

## 2017-03-21 NOTE — Discharge Instructions (Signed)
Please read and follow all provided instructions.  Your diagnoses today include:  1. Acute non-recurrent sinusitis, unspecified location    Tests performed today include:  Vital signs. See below for your results today.   Medications prescribed:   Augmentin - antibiotic  You have been prescribed an antibiotic medicine: take the entire course of medicine even if you are feeling better. Stopping early can cause the antibiotic not to work.  Take any prescribed medications only as directed. Treatment for your infection is aimed at treating the symptoms. There are no medications, such as antibiotics, that will cure your infection.   Home care instructions:  Follow any educational materials contained in this packet.   Your illness is contagious and can be spread to others, especially during the first 3 or 4 days. Take basic precautions such as washing your hands often, covering your mouth when you cough or sneeze, and avoiding public places where you could spread your illness to others.   Please continue drinking plenty of fluids.  Use over-the-counter medicines as needed as directed on packaging for symptom relief.  You may also use ibuprofen or tylenol as directed on packaging for pain or fever.  Do not take multiple medicines containing Tylenol or acetaminophen to avoid taking too much of this medication.  Follow-up instructions: Please follow-up with your primary care provider in the next 7 days for further evaluation of your symptoms if you are not feeling better.   Return instructions:   Please return to the Emergency Department if you experience worsening symptoms.   RETURN IMMEDIATELY IF you develop shortness of breath, confusion or altered mental status, a new rash, become dizzy, faint, or poorly responsive, or are unable to be cared for at home.  Please return if you have persistent vomiting and cannot keep down fluids or develop a fever that is not controlled by tylenol or  motrin.    Please return if you have any other emergent concerns.  Additional Information:  Your vital signs today were: BP (!) 146/93 (BP Location: Left Arm)    Pulse 75    Temp 98.4 F (36.9 C) (Oral)    Resp 16    Ht 5\' 2"  (1.575 m)    Wt 67.1 kg    LMP 02/26/2017 (Exact Date)    SpO2 100%    BMI 27.07 kg/m  If your blood pressure (BP) was elevated above 135/85 this visit, please have this repeated by your doctor within one month. --------------

## 2017-03-21 NOTE — ED Provider Notes (Signed)
[facility] DEPT Provider Note   CSN: [id number] Arrival date & time: 03/21/17  0016     History   Chief Complaint Chief Complaint  Patient presents with  . URI    HPI [name] is a 46 y.o. female.  Patient presents with complaint of 9 days of upper respiratory infection symptoms including nasal congestion, sinus pressure, headache, cough, chills, left ear pain, sore throat. No documented fevers. Cough is improving. Patient continues to have headaches and sinus pressure. No vomiting or diarrhea. Patient has used over-the-counter medications without relief. She has a history of sinus infections. No known sick contacts. The onset of this condition was acute. The course is constant. Aggravating factors: none. Alleviating factors: none.        Past Medical History:  Diagnosis Date  . Allergy    Environmental and seasonal  . Amniotic fluid embolism 2004   history 2004 delivery resolved  . Anemia    hx  . Chronic headaches    otc med prn  . Depression    Treated with Celexa in past.    . Gastritis   . GERD (gastroesophageal reflux disease)    diet controlled -no meds  . History of blood transfusion 2004   [place]   . Hypertension 2004   Hx PIH with 2004 preg - resolved  . Hypothyroidism    hx - but resolved  . Irritable bowel syndrome   . Myocardial infarction 2004   DIC, hypertension during 2004 delivery, no problems since  . Respiratory failure, acute ([place]) 2004   hx 2004 w preg - resolved  . SVD (spontaneous vaginal delivery)    x 5    Patient Active Problem List   Diagnosis Date Noted  . Environmental and seasonal allergies 03/12/2017  . Insomnia 12/11/2016  . Pulmonary embolism complicating pregnancy 40/98/1191  . HYPOTHYROIDISM 07/30/2007  . Depression 07/30/2007  . MYOCARDIAL INFARCTION, HX OF 07/30/2007  . EMBOLISM, AMNIOTIC FLUID, DELIVERED W/PP 07/30/2007  . HX, PERSONAL, SUDDEN CARDIAC ARRREST 07/30/2007    Past Surgical History:    Procedure Laterality Date  . CARDIAC CATHETERIZATION  2004   test normal - no blockages; Reportedly from DIC?  . DILATION AND CURETTAGE OF UTERUS  11/2013   Novasure radiofrequency ablation  . HYSTEROSCOPY WITH NOVASURE N/A 11/27/2013   Procedure: HYSTEROSCOPY WITH NOVASURE;  Surgeon: Lavonia Drafts, MD;  Location: [facility];  Service: Gynecology;  Laterality: N/A;  . KNEE ARTHROSCOPY     Right knee  . TUBAL LIGATION    . WRIST ARTHROSCOPY WITH FOVEAL TRIANGULAR FIBROCARTILAGE COMPLEX REPAIR Right 09/25/2016   Procedure: Right WRIST ARTHROSCOPY WITH FOVEAL TRIANGULAR FIBROCARTILAGE COMPLEX REPAIR;  Surgeon: Daryll Brod, MD;  Location: [facility];  Service: Orthopedics;  Laterality: Right;    OB History    Gravida Para Term Preterm AB Living   6 5 5  0 1 5   SAB TAB Ectopic Multiple Live Births   1 0 0 0         Home Medications    Prior to Admission medications   Medication Sig Start Date End Date Taking? Authorizing Provider  amoxicillin-clavulanate (AUGMENTIN) 875-125 MG tablet Take 1 tablet by mouth every 12 (twelve) hours. 03/21/17   Carlisle Cater, PA-C  fexofenadine (ALLEGRA) 180 MG tablet Take 1 tablet (180 mg total) by mouth daily. 03/12/17   Mack Hook, MD  FLUoxetine (PROZAC) 40 MG capsule Take 1 capsule (40 mg total) by mouth daily. 11/08/16   Mack Hook,  MD  fluticasone (FLONASE) 50 MCG/ACT nasal spray Place 2 sprays into both nostrils daily. 07/04/16   Mack Hook, MD  ibuprofen (ADVIL,MOTRIN) 600 MG tablet Take 1 tablet (600 mg total) by mouth every 8 (eight) hours as needed (with food). 07/04/16   Mack Hook, MD  metroNIDAZOLE (METROGEL) 0.75 % vaginal gel 1 applicatorful intravaginally change to once  weekly for 3 more months 03/12/17   Mack Hook, MD  Multiple Vitamin (MULTIVITAMIN WITH MINERALS) TABS Take 1 tablet by mouth daily.    Historical Provider, MD  traZODone (DESYREL) 50 MG tablet Take 1 tablet (50 mg  total) by mouth at bedtime. 11/08/16   Mack Hook, MD    Family History Family History  Problem Relation Age of Onset  . Arthritis Father   . Hypertension Father   . Depression Brother   . Allergies Son   . Prostate cancer Paternal Uncle   . Stomach cancer Paternal Grandmother     Social History Social History  Substance Use Topics  . Smoking status: Never Smoker  . Smokeless tobacco: Never Used  . Alcohol use 0.0 oz/week     Comment: occas     Allergies   Patient has no known allergies.   Review of Systems Review of Systems  Constitutional: Positive for chills. Negative for fatigue and fever.  HENT: Positive for congestion, ear pain, sinus pressure and sore throat. Negative for rhinorrhea.   Eyes: Negative for redness.  Respiratory: Negative for cough and wheezing.   Gastrointestinal: Negative for abdominal pain, diarrhea, nausea and vomiting.  Genitourinary: Negative for dysuria.  Musculoskeletal: Negative for myalgias and neck stiffness.  Skin: Negative for rash.  Neurological: Positive for headaches.  Hematological: Negative for adenopathy.     Physical Exam Updated Vital Signs BP (!) 146/93 (BP Location: Left Arm)   Pulse 75   Temp 98.4 F (36.9 C) (Oral)   Resp 16   Ht 5\' 2"  (1.575 m)   Wt 67.1 kg   LMP 02/26/2017 (Exact Date)   SpO2 100%   BMI 27.07 kg/m   Physical Exam  Constitutional: She appears well-developed and well-nourished.  HENT:  Head: Normocephalic and atraumatic.  Right Ear: Tympanic membrane, external ear and ear canal normal.  Left Ear: Tympanic membrane, external ear and ear canal normal.  Nose: Mucosal edema present. No rhinorrhea. Right sinus exhibits maxillary sinus tenderness. Right sinus exhibits no frontal sinus tenderness. Left sinus exhibits maxillary sinus tenderness. Left sinus exhibits no frontal sinus tenderness.  Mouth/Throat: Uvula is midline, oropharynx is clear and moist and mucous membranes are normal.  Mucous membranes are not dry. No oral lesions. No trismus in the jaw. No uvula swelling. No oropharyngeal exudate, posterior oropharyngeal edema, posterior oropharyngeal erythema or tonsillar abscesses.  Eyes: Conjunctivae are normal. Right eye exhibits no discharge. Left eye exhibits no discharge.  Neck: Normal range of motion. Neck supple.  Cardiovascular: Normal rate, regular rhythm and normal heart sounds.   Pulmonary/Chest: Effort normal and breath sounds normal. No respiratory distress. She has no wheezes. She has no rales.  Abdominal: Soft. There is no tenderness.  Lymphadenopathy:    She has no cervical adenopathy.  Neurological: She is alert.  Skin: Skin is warm and dry.  Psychiatric: She has a normal mood and affect.  Nursing note and vitals reviewed.    ED Treatments / Results  Labs (all labs ordered are listed, but only abnormal results are displayed) Labs Reviewed  RAPID STREP SCREEN (NOT AT [facility])  CULTURE, GROUP  A STREP [facility])    Procedures Procedures (including critical care time)  Medications Ordered in ED Medications - No data to display   Initial Impression / Assessment and Plan / ED Course  I have reviewed the triage vital signs and the nursing notes.  Pertinent labs & imaging results that were available during my care of the patient were reviewed by me and considered in my medical decision making (see chart for details).     Patient seen and examined.   Vital signs reviewed and are as follows: BP 140/80 (BP Location: Right Arm)   Pulse 78   Temp 98.7 F (37.1 C) (Oral)   Resp 14   Ht 5\' 2"  (1.575 m)   Wt 67.1 kg   LMP 02/26/2017 (Exact Date)   SpO2 99%   BMI 27.07 kg/m   3:56 AM will treat with course of Augmentin. Patient counseled on supportive care for viral URI and s/s to return including worsening symptoms, persistent fever, persistent vomiting, or if they have any other concerns. Urged to see PCP if symptoms persist for more than 3 days.  Patient verbalizes understanding and agrees with plan.    Final Clinical Impressions(s) / ED Diagnoses   Final diagnoses:  Acute non-recurrent sinusitis, unspecified location   Patient with URI symptoms, persistent sore throat and sinus pressure. Exam is most consistent with sinusitis. Given duration of symptoms will treat with antibiotics. Patient may use other over-the-counter medications for symptom relief and states that she has Flonase at home. Patient appears well, nontoxic. She is maintaining her airway without any difficulty.   New Prescriptions New Prescriptions   AMOXICILLIN-CLAVULANATE (AUGMENTIN) 875-125 MG TABLET    Take 1 tablet by mouth every 12 (twelve) hours.     Carlisle Cater, PA-C 03/21/17 [place], MD 03/21/17 [phone]

## 2017-03-22 ENCOUNTER — Other Ambulatory Visit: Payer: Medicaid Other | Admitting: Licensed Clinical Social Worker

## 2017-03-22 ENCOUNTER — Encounter (HOSPITAL_BASED_OUTPATIENT_CLINIC_OR_DEPARTMENT_OTHER): Payer: Self-pay | Admitting: *Deleted

## 2017-03-23 LAB — CULTURE, GROUP A STREP (THRC)

## 2017-03-28 ENCOUNTER — Ambulatory Visit (HOSPITAL_BASED_OUTPATIENT_CLINIC_OR_DEPARTMENT_OTHER)
Admission: RE | Admit: 2017-03-28 | Discharge: 2017-03-28 | Disposition: A | Discharge status: Home / Self Care | Payer: Medicaid Other | Source: Ambulatory Visit | Attending: Orthopedic Surgery | Admitting: Orthopedic Surgery

## 2017-03-28 ENCOUNTER — Ambulatory Visit (HOSPITAL_BASED_OUTPATIENT_CLINIC_OR_DEPARTMENT_OTHER): Payer: Medicaid Other | Admitting: Anesthesiology

## 2017-03-28 ENCOUNTER — Encounter (HOSPITAL_BASED_OUTPATIENT_CLINIC_OR_DEPARTMENT_OTHER): Payer: Self-pay | Admitting: *Deleted

## 2017-03-28 ENCOUNTER — Encounter (HOSPITAL_BASED_OUTPATIENT_CLINIC_OR_DEPARTMENT_OTHER)
Admission: RE | Disposition: A | Discharge status: Home / Self Care | Payer: Self-pay | Source: Ambulatory Visit | Attending: Orthopedic Surgery

## 2017-03-28 DIAGNOSIS — M24831 Other specific joint derangements of right wrist, not elsewhere classified: Secondary | ICD-10-CM | POA: Diagnosis present

## 2017-03-28 DIAGNOSIS — Z79899 Other long term (current) drug therapy: Secondary | ICD-10-CM | POA: Insufficient documentation

## 2017-03-28 DIAGNOSIS — F329 Major depressive disorder, single episode, unspecified: Secondary | ICD-10-CM | POA: Diagnosis not present

## 2017-03-28 DIAGNOSIS — I252 Old myocardial infarction: Secondary | ICD-10-CM | POA: Diagnosis not present

## 2017-03-28 DIAGNOSIS — Z8674 Personal history of sudden cardiac arrest: Secondary | ICD-10-CM | POA: Diagnosis not present

## 2017-03-28 HISTORY — PX: ULNAR SHORTENING WITH BONE GRAFT: SHX6330

## 2017-03-28 SURGERY — ULNAR SHORTENING WITH BONE GRAFT
Anesthesia: General | Site: Wrist | Laterality: Right

## 2017-03-28 MED ORDER — MIDAZOLAM HCL 2 MG/2ML IJ SOLN
INTRAMUSCULAR | Status: AC
  Filled 2017-03-28: qty 2

## 2017-03-28 MED ORDER — LIDOCAINE 2% (20 MG/ML) 5 ML SYRINGE
INTRAMUSCULAR | Status: AC
  Filled 2017-03-28: qty 5

## 2017-03-28 MED ORDER — FENTANYL CITRATE (PF) 100 MCG/2ML IJ SOLN: 25.0000 ug | INTRAMUSCULAR | Status: DC | PRN

## 2017-03-28 MED ORDER — DEXTROSE 5 % IN LACTATED RINGERS IV BOLUS
INTRAVENOUS | Status: DC | PRN
  Administered 2017-03-28: 30 mL via INTRAVENOUS

## 2017-03-28 MED ORDER — DEXAMETHASONE SODIUM PHOSPHATE 4 MG/ML IJ SOLN
INTRAMUSCULAR | Status: DC | PRN
  Administered 2017-03-28: 10 mg via INTRAVENOUS

## 2017-03-28 MED ORDER — ONDANSETRON HCL 4 MG/2ML IJ SOLN
INTRAMUSCULAR | Status: AC
  Filled 2017-03-28: qty 2

## 2017-03-28 MED ORDER — LACTATED RINGERS IV SOLN: INTRAVENOUS | Status: DC

## 2017-03-28 MED ORDER — FENTANYL CITRATE (PF) 100 MCG/2ML IJ SOLN
50.0000 ug | INTRAMUSCULAR | Status: DC | PRN
  Administered 2017-03-28: 100 ug via INTRAVENOUS
  Administered 2017-03-28: 50 ug via INTRAVENOUS

## 2017-03-28 MED ORDER — SCOPOLAMINE 1 MG/3DAYS TD PT72: 1.0000 | MEDICATED_PATCH | Freq: Once | TRANSDERMAL | Status: DC | PRN

## 2017-03-28 MED ORDER — MEPERIDINE HCL 25 MG/ML IJ SOLN: 6.2500 mg | INTRAMUSCULAR | Status: DC | PRN

## 2017-03-28 MED ORDER — CEFAZOLIN SODIUM-DEXTROSE 2-4 GM/100ML-% IV SOLN
INTRAVENOUS | Status: AC
  Filled 2017-03-28: qty 100

## 2017-03-28 MED ORDER — FENTANYL CITRATE (PF) 100 MCG/2ML IJ SOLN
INTRAMUSCULAR | Status: AC
  Filled 2017-03-28: qty 2

## 2017-03-28 MED ORDER — CEFAZOLIN SODIUM-DEXTROSE 2-4 GM/100ML-% IV SOLN
2.0000 g | INTRAVENOUS | Status: AC
  Administered 2017-03-28: 2 g via INTRAVENOUS

## 2017-03-28 MED ORDER — BUPIVACAINE-EPINEPHRINE (PF) 0.5% -1:200000 IJ SOLN
INTRAMUSCULAR | Status: DC | PRN
  Administered 2017-03-28: 30 mL via PERINEURAL

## 2017-03-28 MED ORDER — DEXAMETHASONE SODIUM PHOSPHATE 10 MG/ML IJ SOLN
INTRAMUSCULAR | Status: AC
  Filled 2017-03-28: qty 1

## 2017-03-28 MED ORDER — PROPOFOL 10 MG/ML IV BOLUS
INTRAVENOUS | Status: DC | PRN
  Administered 2017-03-28: 200 mg via INTRAVENOUS

## 2017-03-28 MED ORDER — OXYCODONE-ACETAMINOPHEN 7.5-325 MG PO TABS: 1.0000 | ORAL_TABLET | ORAL | 0 refills | Status: DC | PRN

## 2017-03-28 MED ORDER — LACTATED RINGERS IV SOLN
INTRAVENOUS | Status: DC
  Administered 2017-03-28: 09:00:00 via INTRAVENOUS

## 2017-03-28 MED ORDER — METOCLOPRAMIDE HCL 5 MG/ML IJ SOLN: 10.0000 mg | Freq: Once | INTRAMUSCULAR | Status: DC | PRN

## 2017-03-28 MED ORDER — CHLORHEXIDINE GLUCONATE 4 % EX LIQD: 60.0000 mL | Freq: Once | CUTANEOUS | Status: DC

## 2017-03-28 MED ORDER — MIDAZOLAM HCL 2 MG/2ML IJ SOLN
1.0000 mg | INTRAMUSCULAR | Status: DC | PRN
  Administered 2017-03-28: 2 mg via INTRAVENOUS

## 2017-03-28 MED ORDER — LIDOCAINE 2% (20 MG/ML) 5 ML SYRINGE
INTRAMUSCULAR | Status: DC | PRN
  Administered 2017-03-28: 50 mg via INTRAVENOUS

## 2017-03-28 MED ORDER — ONDANSETRON HCL 4 MG/2ML IJ SOLN
INTRAMUSCULAR | Status: DC | PRN
  Administered 2017-03-28: 4 mg via INTRAVENOUS

## 2017-03-28 SURGICAL SUPPLY — 70 items
BAG DECANTER FOR FLEXI CONT (MISCELLANEOUS) ×4 IMPLANT
BIT DRILL 2.8X5 QR DISP (BIT) ×4 IMPLANT
BIT DRILL QUICK RELEASE 3.5MM (BIT) ×2 IMPLANT
BLADE AVERAGE 25MMX9MM (BLADE)
BLADE AVERAGE 25X9 (BLADE) IMPLANT
BLADE MINI RND TIP GREEN BEAV (BLADE) IMPLANT
BLADE SAW OSTEOTOMY (BLADE) ×4 IMPLANT
BLADE SURG 15 STRL LF DISP TIS (BLADE) ×2 IMPLANT
BLADE SURG 15 STRL SS (BLADE) ×2
BNDG COHESIVE 3X5 TAN STRL LF (GAUZE/BANDAGES/DRESSINGS) ×8 IMPLANT
BNDG ESMARK 4X9 LF (GAUZE/BANDAGES/DRESSINGS) ×4 IMPLANT
BNDG GAUZE ELAST 4 BULKY (GAUZE/BANDAGES/DRESSINGS) ×4 IMPLANT
BONE TRINITY ELITE 1.2CC SM (Bone Implant) ×4 IMPLANT
BUR EGG 3PK/BX (BURR) IMPLANT
CANISTER SUCT 1200ML W/VALVE (MISCELLANEOUS) IMPLANT
CHLORAPREP W/TINT 26ML (MISCELLANEOUS) ×4 IMPLANT
CORDS BIPOLAR (ELECTRODE) ×4 IMPLANT
COVER BACK TABLE 60X90IN (DRAPES) ×4 IMPLANT
COVER MAYO STAND STRL (DRAPES) ×4 IMPLANT
CUFF TOURNIQUET SINGLE 18IN (TOURNIQUET CUFF) IMPLANT
DECANTER SPIKE VIAL GLASS SM (MISCELLANEOUS) IMPLANT
DRAIN PENROSE 1/2X12 LTX STRL (WOUND CARE) IMPLANT
DRAPE EXTREMITY T 121X128X90 (DRAPE) ×4 IMPLANT
DRAPE OEC MINIVIEW 54X84 (DRAPES) ×4 IMPLANT
DRAPE SURG 17X23 STRL (DRAPES) ×4 IMPLANT
DRILL QUICK RELEASE 3.5MM (BIT) ×4
GAUZE SPONGE 4X4 12PLY STRL (GAUZE/BANDAGES/DRESSINGS) ×4 IMPLANT
GAUZE SPONGE 4X4 16PLY XRAY LF (GAUZE/BANDAGES/DRESSINGS) IMPLANT
GAUZE XEROFORM 1X8 LF (GAUZE/BANDAGES/DRESSINGS) ×4 IMPLANT
GLOVE BIOGEL PI IND STRL 7.0 (GLOVE) ×6 IMPLANT
GLOVE BIOGEL PI INDICATOR 7.0 (GLOVE) ×6
GLOVE ECLIPSE 6.5 STRL STRAW (GLOVE) ×4 IMPLANT
GLOVE SURG ORTHO 8.0 STRL STRW (GLOVE) ×4 IMPLANT
GLOVE SURG SS PI 6.5 STRL IVOR (GLOVE) ×4 IMPLANT
GOWN STRL REUS W/ TWL LRG LVL3 (GOWN DISPOSABLE) ×4 IMPLANT
GOWN STRL REUS W/TWL LRG LVL3 (GOWN DISPOSABLE) ×4
GOWN STRL REUS W/TWL XL LVL3 (GOWN DISPOSABLE) ×8 IMPLANT
GUIDEWIRE ORTHO 0.054X6 (WIRE) ×8 IMPLANT
KIT MINI BIO ANCHOR DRILL (KITS) IMPLANT
NS IRRIG 1000ML POUR BTL (IV SOLUTION) ×4 IMPLANT
PACK BASIN DAY SURGERY FS (CUSTOM PROCEDURE TRAY) ×4 IMPLANT
PAD CAST 4YDX4 CTTN HI CHSV (CAST SUPPLIES) IMPLANT
PADDING CAST ABS 4INX4YD NS (CAST SUPPLIES) ×2
PADDING CAST ABS COTTON 4X4 ST (CAST SUPPLIES) ×2 IMPLANT
PADDING CAST COTTON 4X4 STRL (CAST SUPPLIES)
PLATE ULNAR SHORTENING (Plate) ×4 IMPLANT
SCREW ACTK 2 NL HEX 3.5.11 (Screw) ×8 IMPLANT
SCREW HEXALOBE NON-LOCK 3.5X14 (Screw) ×4 IMPLANT
SCREW NON LOCK 3.5X10MM (Screw) ×4 IMPLANT
SCREW NONLOCK HEX 3.5X12 (Screw) ×12 IMPLANT
SLEEVE SCD COMPRESS KNEE MED (MISCELLANEOUS) ×4 IMPLANT
SLING ARM FOAM STRAP LRG (SOFTGOODS) ×4 IMPLANT
SLING ARM MED ADULT FOAM STRAP (SOFTGOODS) IMPLANT
SPLINT PLASTER CAST XFAST 3X15 (CAST SUPPLIES) ×42 IMPLANT
SPLINT PLASTER XTRA FASTSET 3X (CAST SUPPLIES) ×42
STOCKINETTE 4X48 STRL (DRAPES) ×4 IMPLANT
SUCTION FRAZIER HANDLE 10FR (MISCELLANEOUS)
SUCTION TUBE FRAZIER 10FR DISP (MISCELLANEOUS) IMPLANT
SUT ETHILON 4 0 PS 2 18 (SUTURE) ×4 IMPLANT
SUT MERSILENE 4 0 P 3 (SUTURE) IMPLANT
SUT VIC AB 2-0 PS2 27 (SUTURE) IMPLANT
SUT VICRYL 4-0 PS2 18IN ABS (SUTURE) ×4 IMPLANT
SYR BULB 3OZ (MISCELLANEOUS) ×4 IMPLANT
SYR CONTROL 10ML LL (SYRINGE) IMPLANT
TOWEL OR 17X24 6PK STRL BLUE (TOWEL DISPOSABLE) ×4 IMPLANT
TOWEL OR NON WOVEN STRL DISP B (DISPOSABLE) ×4 IMPLANT
TUBE CONNECTING 20'X1/4 (TUBING)
TUBE CONNECTING 20X1/4 (TUBING) IMPLANT
UNDERPAD 30X30 (UNDERPADS AND DIAPERS) IMPLANT
WIRE TACK PLATE PL-PTACK (WIRE) ×4 IMPLANT

## 2017-03-28 NOTE — Anesthesia Procedure Notes (Signed)
Procedure Name: LMA Insertion Performed by: Malashia Kamaka W Pre-anesthesia Checklist: Patient identified, Emergency Drugs available, Suction available and Patient being monitored Patient Re-evaluated:Patient Re-evaluated prior to inductionOxygen Delivery Method: Circle system utilized Preoxygenation: Pre-oxygenation with 100% oxygen Intubation Type: IV induction Ventilation: Mask ventilation without difficulty LMA: LMA inserted LMA Size: 4.0 Number of attempts: 1 Placement Confirmation: positive ETCO2 Tube secured with: Tape Dental Injury: Teeth and Oropharynx as per pre-operative assessment        

## 2017-03-28 NOTE — Anesthesia Preprocedure Evaluation (Signed)
Anesthesia Evaluation  Patient identified by MRN, date of birth, ID band Patient awake    Reviewed: Allergy & Precautions, NPO status , Patient's Chart, lab work & pertinent test results  Airway Mallampati: II  TM Distance: >3 FB Neck ROM: Full    Dental no notable dental hx.    Pulmonary neg pulmonary ROS,    Pulmonary exam normal breath sounds clear to auscultation       Cardiovascular negative cardio ROS Normal cardiovascular exam Rhythm:Regular Rate:Normal     Neuro/Psych negative neurological ROS  negative psych ROS   GI/Hepatic negative GI ROS, Neg liver ROS,   Endo/Other  negative endocrine ROS  Renal/GU negative Renal ROS  negative genitourinary   Musculoskeletal negative musculoskeletal ROS (+)   Abdominal   Peds negative pediatric ROS (+)  Hematology negative hematology ROS (+)   Anesthesia Other Findings Amniotic fluid embolism with pregnancy 2004. Resulted in cardiac arrest, MI, ARF. Survived with no sequelae.   Reproductive/Obstetrics negative OB ROS                             Anesthesia Physical Anesthesia Plan  ASA: II  Anesthesia Plan: General   Post-op Pain Management:  Regional for Post-op pain   Induction: Intravenous  Airway Management Planned: LMA  Additional Equipment:   Intra-op Plan:   Post-operative Plan: Extubation in OR  Informed Consent: I have reviewed the patients History and Physical, chart, labs and discussed the procedure including the risks, benefits and alternatives for the proposed anesthesia with the patient or authorized representative who has indicated his/her understanding and acceptance.   Dental advisory given  Plan Discussed with: CRNA  Anesthesia Plan Comments: (R SCB)        Anesthesia Quick Evaluation

## 2017-03-28 NOTE — H&P (Signed)
[name] is an 46 y.o. female.   Chief Complaint: ulnar wrist pain  HPI: [name] is a 46 year old right-hand-dominant female referred by Dr. Amil Amen for consultation regarding pain in her right wrist. This been going on since June 2017 when she sustained a twisting injury when she was reaching behind her. She complains of pain on the ulnar aspect of her right wrist. She came complains of swelling in that area. She has no prior history of injury. She complains of a toothache type pain with a VAS score of 9/10. She states not using it helps with that she has worn a splint which has helped she states that twisting her wrist causes pain for her. She has tried occasional ibuprofen which has given her some relief of symptoms. She localizes the pain directly over the tip of the right ulnar styloid. He states none use makes it better. She has no history of diabetes thyroid problems arthritis or gout. Family history is negative for each of these also. She states her splint has helped with her discomfort.She has undergone arthroscopic debridement of triangular fibrocartilage complex is has not resolved symptoms for her. She continues to complain of moderate to aching pain on the ulnar aspect of her right wrist have discussed with her in the past possibility of a ulnar shortening osteotomy. We have tried meloxicam and Relafen without relief of symptoms.          Past Medical History:  Diagnosis Date  . Allergy    Environmental and seasonal  . Amniotic fluid embolism 2004   history 2004 delivery resolved  . Anemia    hx  . Chronic headaches    otc med prn  . Depression    Treated with Celexa in past.    . Gastritis   . History of blood transfusion 2004   [place]   . Hypertension 2004   Hx PIH with 2004 preg - resolved  . Hypothyroidism    hx - but resolved  . Irritable bowel syndrome   . Myocardial infarction [facility]) 2004   DIC, hypertension during 2004 delivery, no problems since  .  Respiratory failure, acute ([place]) 2004   hx 2004 w preg - resolved  . SVD (spontaneous vaginal delivery)    x 5  . Ulnocarpal abutment syndrome, right     Past Surgical History:  Procedure Laterality Date  . CARDIAC CATHETERIZATION  2004   test normal - no blockages; Reportedly from DIC?  . DILATION AND CURETTAGE OF UTERUS  11/2013   Novasure radiofrequency ablation  . HYSTEROSCOPY WITH NOVASURE N/A 11/27/2013   Procedure: HYSTEROSCOPY WITH NOVASURE;  Surgeon: Lavonia Drafts, MD;  Location: [facility];  Service: Gynecology;  Laterality: N/A;  . KNEE ARTHROSCOPY     Right knee  . TUBAL LIGATION    . WRIST ARTHROSCOPY WITH FOVEAL TRIANGULAR FIBROCARTILAGE COMPLEX REPAIR Right 09/25/2016   Procedure: Right WRIST ARTHROSCOPY WITH FOVEAL TRIANGULAR FIBROCARTILAGE COMPLEX REPAIR;  Surgeon: Daryll Brod, MD;  Location: [facility];  Service: Orthopedics;  Laterality: Right;    Family History  Problem Relation Age of Onset  . Arthritis Father   . Hypertension Father   . Depression Brother   . Allergies Son   . Prostate cancer Paternal Uncle   . Stomach cancer Paternal Grandmother    Social History:  reports that she has never smoked. She has never used smokeless tobacco. She reports that she drinks alcohol. She reports that she does not use drugs.  Allergies:  No Known Allergies  No prescriptions prior to admission.    No results found for this or any previous visit (from the past 48 hour(s)).  No results found.   Pertinent items are noted in HPI.  Height 5\' 2"  (1.575 m), weight 67.1 kg (148 lb), last menstrual period 03/18/2017.  General appearance: alert, cooperative and appears stated age Head: Normocephalic, without obvious abnormality Neck: no JVD Resp: clear to auscultation bilaterally Cardio: regular rate and rhythm, S1, S2 normal, no murmur, click, rub or gallop GI: soft, non-tender; bowel sounds normal; no masses,  no organomegaly Extremities: ulnar  wrist pain right Pulses: 2+ and symmetric Skin: Skin color, texture, turgor normal. No rashes or lesions Neurologic: Grossly normal Incision/Wound: na  Assessment/Plan Assessment:  1. Abutment syndrome of wrist, right     Plan: Have discussed possibility of ulnar shortening osteotomy with her. Pre-peri-and postoperative course are discussed along with risks and complications. She is where there is no guarantee to the surgery the possibility of infection recurrence injury to arteries nerves tendons incomplete release symptoms and dystrophy. She would like to proceed. She is scheduled for ulnar shortening osteotomy right ulna as an outpatient under regional anesthesia.       Welma Mccombs R 03/28/2017, 4:58 AM

## 2017-03-28 NOTE — Anesthesia Procedure Notes (Signed)
Anesthesia Regional Block: Supraclavicular block   Pre-Anesthetic Checklist: ,, timeout performed, Correct Patient, Correct Site, Correct Laterality, Correct Procedure, Correct Position, site marked, Risks and benefits discussed,  Surgical consent,  Pre-op evaluation,  At surgeon's request and post-op pain management  Laterality: Right and Upper  Prep: Maximum Sterile Barrier Precautions used, chloraprep       Needles:  Injection technique: Single-shot  Needle Type: Echogenic Stimulator Needle     Needle Length: 10cm      Additional Needles:   Procedures: ultrasound guided,,,,,,,,  Narrative:  Start time: 03/28/2017 9:07 AM End time: 03/28/2017 9:12 AM Injection made incrementally with aspirations every 5 mL.  Performed by: Personally  Anesthesiologist: Montez Hageman  Additional Notes: Risks, benefits and alternative to block explained extensively.  Patient tolerated procedure well, without complications.

## 2017-03-28 NOTE — Transfer of Care (Signed)
Immediate Anesthesia Transfer of Care Note  Patient: [name]  Procedure(s) Performed: Procedure(s): RIGHT ULNA WRIST OSTEOTOMY (Right)  Patient Location: PACU  Anesthesia Type:General  Level of Consciousness: awake and sedated  Airway & Oxygen Therapy: Patient Spontanous Breathing and Patient connected to face mask oxygen  Post-op Assessment: Report given to RN and Post -op Vital signs reviewed and stable  Post vital signs: Reviewed and stable  Last Vitals:  Vitals:   03/28/17 0915 03/28/17 0920  BP: 129/84 133/83  Pulse: 74 84  Resp: 11 12  Temp:      Last Pain:  Vitals:   03/28/17 0836  TempSrc: Oral         Complications: No apparent anesthesia complications

## 2017-03-28 NOTE — Brief Op Note (Signed)
03/28/2017  11:00 AM  PATIENT:  [name]  46 y.o. female  PRE-OPERATIVE DIAGNOSIS:  RIGHT WRIST ULNOCARPAL ABUTMENT SYNDROME  POST-OPERATIVE DIAGNOSIS:  RIGHT WRIST ULNOCARPAL ABUTMENT SYNDROME  PROCEDURE:  Procedure(s): RIGHT ULNA WRIST OSTEOTOMY (Right)  SURGEON:  Surgeon(s) and Role:    * Daryll Brod, MD - Primary    * Leanora Cover, MD  PHYSICIAN ASSISTANT:   ASSISTANTS: K Izzabella Besse,MD   ANESTHESIA:   regional and general  EBL:  Total I/O In: 1000 [I.V.:1000] Out: 5 [Blood:5]  BLOOD ADMINISTERED:none  DRAINS: none   LOCAL MEDICATIONS USED:  NONE  SPECIMEN:  No Specimen  DISPOSITION OF SPECIMEN:  N/A  COUNTS:  YES  TOURNIQUET:   Total Tourniquet Time Documented: Upper Arm (Right) - 55 minutes Total: Upper Arm (Right) - 55 minutes   DICTATION: .Other Dictation: Dictation Number [id number]  PLAN OF CARE: Discharge to home after PACU  PATIENT DISPOSITION:  PACU - hemodynamically stable.

## 2017-03-28 NOTE — Anesthesia Preprocedure Evaluation (Signed)
Anesthesia Evaluation    Airway        Dental   Pulmonary PE          Cardiovascular hypertension, + Past MI       Neuro/Psych    GI/Hepatic   Endo/Other    Renal/GU      Musculoskeletal   Abdominal   Peds  Hematology   Anesthesia Other Findings   Reproductive/Obstetrics                             Anesthesia Physical Anesthesia Plan Anesthesia Quick Evaluation

## 2017-03-28 NOTE — Op Note (Signed)
Dictation Number [id number]

## 2017-03-28 NOTE — Op Note (Signed)
I assisted Surgeon(s) and Role:    * Daryll Brod, MD - Primary    * Leanora Cover, MD on the Procedure(s): RIGHT ULNA WRIST OSTEOTOMY on 03/28/2017.  I provided assistance on this case as follows: retraction soft tissues, adjustment of hardware.  Electronically signed by: Tennis Must, MD Date: 03/28/2017 Time: 2:18 PM

## 2017-03-28 NOTE — Anesthesia Postprocedure Evaluation (Signed)
Anesthesia Post Note  Patient: [name]  Procedure(s) Performed: Procedure(s) (LRB): RIGHT ULNA WRIST OSTEOTOMY (Right)  Patient location during evaluation: PACU Anesthesia Type: General and Regional Level of consciousness: awake and alert Pain management: pain level controlled Vital Signs Assessment: post-procedure vital signs reviewed and stable Respiratory status: spontaneous breathing, nonlabored ventilation, respiratory function stable and patient connected to nasal cannula oxygen Cardiovascular status: blood pressure returned to baseline and stable Postop Assessment: no signs of nausea or vomiting Anesthetic complications: no       Last Vitals:  Vitals:   03/28/17 1130 03/28/17 1215  BP: 125/85 129/85  Pulse: 74 79  Resp: 17 16  Temp:  36.8 C    Last Pain:  Vitals:   03/28/17 1215  TempSrc: Oral  PainSc: 0-No pain                 Montez Hageman

## 2017-03-28 NOTE — Progress Notes (Signed)
Assisted Dr. Carignan with right, ultrasound guided, supraclavicular block. Side rails up, monitors on throughout procedure. See vital signs in flow sheet. Tolerated Procedure well. 

## 2017-03-28 NOTE — Discharge Instructions (Addendum)

## 2017-03-29 ENCOUNTER — Encounter (HOSPITAL_BASED_OUTPATIENT_CLINIC_OR_DEPARTMENT_OTHER): Payer: Self-pay | Admitting: Orthopedic Surgery

## 2017-03-29 NOTE — Op Note (Signed)
[name], CHRISTIAN            ACCOUNT NO.:  [id number]  MEDICAL RECORD NO.:  [id number]  LOCATION:  [facility]                        FACILITY:  [facility]  PHYSICIAN:  Daryll Brod, M.D.       DATE OF BIRTH:  26-Feb-1971  DATE OF PROCEDURE:  03/28/2017 DATE OF DISCHARGE:  03/21/2017                              OPERATIVE REPORT   PREOPERATIVE DIAGNOSIS:  Ulnocarpal abutment, right wrist.  POSTOPERATIVE DIAGNOSIS:  Ulnocarpal abutment, right wrist.  OPERATION:  Ulnar shortening osteotomy, right wrist.  SURGEON:  Daryll Brod, MD.  ASSISTANT: K Elizbeth Posa,M.D.  ANESTHESIA:  Supraclavicular block general.  ANESTHESIOLOGIST:  Montez Hageman, MD.  HISTORY:  The patient is a 46 year old female with a history of ulnar- sided wrist pain to her right wrist.  This has not responded to conservative treatment, including debridement arthroscopically.  She is admitted now for ulnar shortening osteotomy of her right ulna.  Pre, peri and postoperative course have been discussed along with risks and complications.  She is aware that there is no guarantee to the surgery; the possibility of infection; recurrence of injury to arteries, nerves, tendons; incomplete relief of symptoms; dystrophy.  She was advised possibility of nonunion, delayed union of the osteotomy site.  Pre, peri and postoperative course have been discussed.  The patient was seen in the preoperative area, extremity marked by both patient and surgeon, and antibiotic given.  PROCEDURE IN DETAIL:  The patient was brought to the operating room, where a general anesthetic was carried out without difficulty.  A supraclavicular block carried out in the preoperative area under the direction of Dr. Marcell Barlow.  She was prepped using ChloraPrep with a supine position with the right arm free.  A 3-minute dry time was allowed.  Time-out taken, confirming the patient and procedure.  The limb was exsanguinated with an Esmarch bandage.  Tourniquet placed  high on the arm, was inflated to 250 mmHg.  A longitudinal incision was then made on the ulnar aspect of her right forearm, carried down through subcutaneous tissue.  Bleeders were electrocauterized with bipolar.  The dissection was carried down in the interval between the flexor carpi ulnaris and extensor carpi ulnaris.  The pronator quadratus was then elevated off the distal ulna.  The dissection was carried proximally lifting the flexor carpi ulnaris off from the ulna proximally.  The periosteum was incised and elevated with a periosteal elevator and Soil scientist.  Retractors were placed allowing placement of an Acumed ulnar shortening osteotomy plate.  This was clamped in position.  Drill holes were then made in the 3 distal screws and a most distal screw applied stabilizing the bone distally.  This was then checked proximally to be certain it lied on the ulna in good position.  This was then pinned with a K-wire and a drill hole made proximally for the sliding screw.  The fixator was placed and olive k-wire was placed into the most proximal screw.  This stabilized the entire bone construct.  A cutting jig was then applied.  Two osteotomy sites were made for a 2 mm osteotomy.  The bone section was removed.  The bone was then clamped after removal of the olive k-wire loosening of the  gliding screw proximally.  The osteotomy site was then compressed.  The sliding screw was placed for allowing compression.  After the compression screw was placed, this was drilled, measured, and tapped to 14 mm.  The other screws had been placed were all 11 mm screws.  The distal screws were then placed.  These measured 10 and 11 mm.  The proximal screws were then placed.  These measured 12 mm.  X-rays confirmed that the osteotomy site was well compressed. Trinity bone graft was then packed around the osteotomy site after elevating the periosteum off that portion of the bone.  The wound was then closed  in layers with 4-0 Vicryl sutures and a 4-0 nylon in the skin.  X-rays taken prior to closure of the wound AP, lateral and oblique revealed that the plate screws lied in good position with good compression of the osteotomy site.  A dorsal splint long arm in nature with a sidebar was then placed.  The remainder of the compression dressing applied.  The tourniquet deflated and all fingers pinked.  She was taken to the recovery room for observation in satisfactory condition.  She will be discharged to home to return to the [facility] in 1 week, on Percocet.          ______________________________ Daryll Brod, M.D.     GK/MEDQ  D:  03/28/2017  T:  03/28/2017  Job:  [id number]

## 2017-07-12 ENCOUNTER — Ambulatory Visit (INDEPENDENT_AMBULATORY_CARE_PROVIDER_SITE_OTHER): Payer: Medicaid Other | Admitting: Internal Medicine

## 2017-07-12 ENCOUNTER — Encounter: Payer: Self-pay | Admitting: Internal Medicine

## 2017-07-12 VITALS — BP 144/90 | HR 78 | Resp 12 | Ht 61.0 in | Wt 154.0 lb

## 2017-07-12 DIAGNOSIS — N760 Acute vaginitis: Secondary | ICD-10-CM

## 2017-07-12 DIAGNOSIS — B9689 Other specified bacterial agents as the cause of diseases classified elsewhere: Secondary | ICD-10-CM | POA: Diagnosis not present

## 2017-07-12 DIAGNOSIS — R030 Elevated blood-pressure reading, without diagnosis of hypertension: Secondary | ICD-10-CM

## 2017-07-12 DIAGNOSIS — J3089 Other allergic rhinitis: Secondary | ICD-10-CM | POA: Diagnosis not present

## 2017-07-12 LAB — POCT WET PREP WITH KOH
Clue Cells Wet Prep HPF POC: NEGATIVE
KOH PREP POC: NEGATIVE
RBC Wet Prep HPF POC: NEGATIVE
TRICHOMONAS UA: NEGATIVE
YEAST WET PREP PER HPF POC: NEGATIVE

## 2017-07-12 MED ORDER — FEXOFENADINE HCL 180 MG PO TABS: 180.0000 mg | ORAL_TABLET | Freq: Every day | ORAL | Status: DC

## 2017-07-12 MED ORDER — METRONIDAZOLE 0.75 % VA GEL
VAGINAL | 2 refills | Status: DC
Start: 2017-07-12 — End: 2018-04-29

## 2017-07-12 NOTE — Progress Notes (Signed)
   Subjective:    Patient ID: [name], female    DOB: March 13, 1971, 46 y.o.   MRN: [id number]  HPI   Chronic/recurrent BV:  Is using vaginal metrogel now once weekly.  Tried to stop last month for 1-2 weeks, but after intercourse, noted odor again and started back on twice weekly.  Last use was last night.    Sore throat:  Was seen in ED for what was diagnosed as sinusitis and treated with Augmentin.  Only took 8 days worth of treatment as developed diarrhea.  Discussed calling office first in the future, even after hours to care for outpatient illnesses.   States she started having a sore throat a couple of weeks ago and continues to have the sore throat.   No sneezing, but is coughing productive of light phlegm.  No fevers.  Is having nasal drainage.  No itchy eyes, nose, throat or ears.    Concerned with elevated BP at Dr. Levell July office in recent weeks to months.  Today up a bit as well.  Has been using Nyquil regularly for past 2 weeks until 2 days ago.  Current Meds  Medication Sig  . FLUoxetine (PROZAC) 40 MG capsule Take 1 capsule (40 mg total) by mouth daily.  . fluticasone (FLONASE) 50 MCG/ACT nasal spray Place 2 sprays into both nostrils daily.  Marland Kitchen ibuprofen (ADVIL,MOTRIN) 600 MG tablet Take 1 tablet (600 mg total) by mouth every 8 (eight) hours as needed (with food).  . metroNIDAZOLE (METROGEL) 0.75 % vaginal gel 1 applicatorful intravaginally change to once  weekly for 3 more months  . Multiple Vitamin (MULTIVITAMIN WITH MINERALS) TABS Take 1 tablet by mouth daily.  . traZODone (DESYREL) 50 MG tablet Take 1 tablet (50 mg total) by mouth at bedtime.    No Known Allergies      Review of Systems     Objective:   Physical Exam  NAD HEENT:  PERRL, EOMI, TMs pearly gray, throat difficult to see, but appears to have mild cobbling.  Nasal mucosa mainly on left swollen and red with clear discharge. Neck: Supple, No adenopathy Chest:  CTA CV:  RRR with normal S1 and  S2, No S3, S4 or murmur.  Radial and DP pulses normal and equal.  Self swab (vaginal) for wet prep and KOH:  No clue cells or whiff +.  No other abnormality.      Assessment & Plan:  1.  Recurrent BV:  To decrease to once weekly use of vaginal Metrogel for 3 months and to try nonlubricated condoms with future intercourse to see if makes a difference.  2.  Likely allergies vs.  Resolving URI:  Add Fexofenadine 180 mg daily and continue nasal corticosteroids.  3.  Elevated BP:  Check BMP.  To follow up for bp check only in 2 weeks and let us know then if sore throat no better.  Encouraged patient to call clinic for outpatient illnesses in the future instead for using ED/urgent care

## 2017-07-13 LAB — BASIC METABOLIC PANEL
BUN / CREAT RATIO: 16 (ref 9–23)
BUN: 15 mg/dL (ref 6–24)
CO2: 23 mmol/L (ref 20–29)
CREATININE: 0.96 mg/dL (ref 0.57–1.00)
Calcium: 9.2 mg/dL (ref 8.7–10.2)
Chloride: 105 mmol/L (ref 96–106)
GFR calc Af Amer: 81 mL/min/{1.73_m2} (ref >59)
GFR calc non Af Amer: 71 mL/min/{1.73_m2} (ref >59)
GLUCOSE: 74 mg/dL (ref 65–99)
POTASSIUM: 4 mmol/L (ref 3.5–5.2)
SODIUM: 141 mmol/L (ref 134–144)

## 2017-07-26 ENCOUNTER — Other Ambulatory Visit: Payer: Medicaid Other

## 2017-08-14 ENCOUNTER — Other Ambulatory Visit (INDEPENDENT_AMBULATORY_CARE_PROVIDER_SITE_OTHER): Payer: Medicaid Other

## 2017-08-14 VITALS — BP 130/84 | HR 66

## 2017-08-14 DIAGNOSIS — R030 Elevated blood-pressure reading, without diagnosis of hypertension: Secondary | ICD-10-CM

## 2017-08-14 NOTE — Progress Notes (Signed)
Informed patient to continue with current regimen and make sure she includes diet and exercise. Per Dr. Amil Amen she agrees

## 2017-08-23 IMAGING — MR MR WRIST*R* W/ CM
4 of 5 series · 26 of 40 positions shown · IV contrast (agent unspecified)
Comparison: None.

CLINICAL DATA: Gradual onset right wrist pain for 3 months. No
known injury. Insert image

EXAM:
MRI OF THE RIGHT WRIST WITH CONTRAST(MR Arthrogram)
TECHNIQUE: Multiplanar, multisequence MR imaging of the wrist was performed
immediately following contrast injection into the radiocarpal joint
under fluoroscopic guidance. No intravenous contrast was
administered.

[Series 3: T2 fat-sat · axial · 3.0mm · 0.20mm/px · z∈[-14,+44]mm · 11 of 20 slices shown (1 of 2)]
[im 1/20]
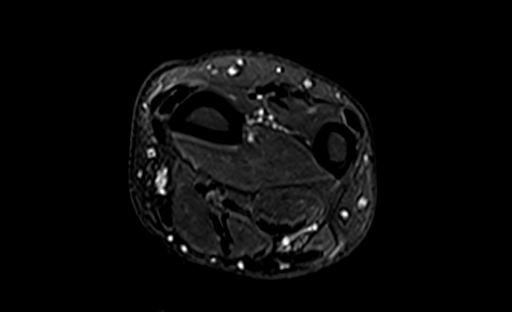
[im 2/20]
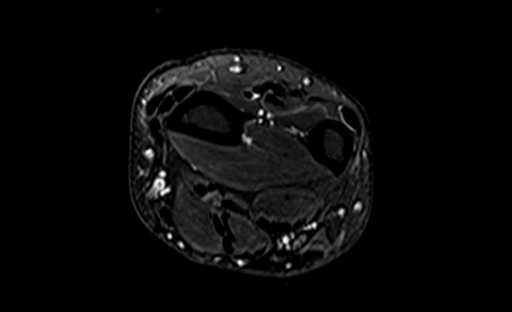
[im 4/20]
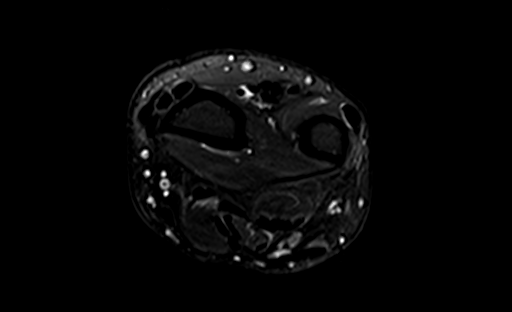
[im 6/20]
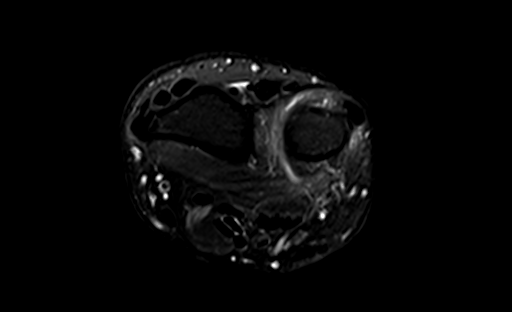
[im 8/20]
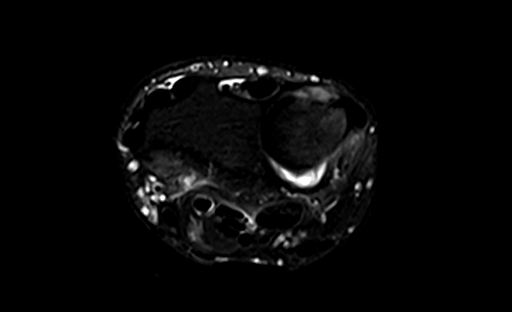
[im 10/20]
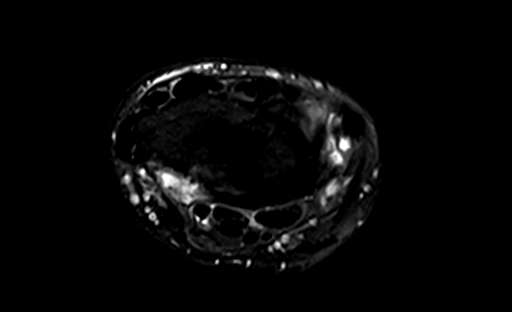
[im 12/20]
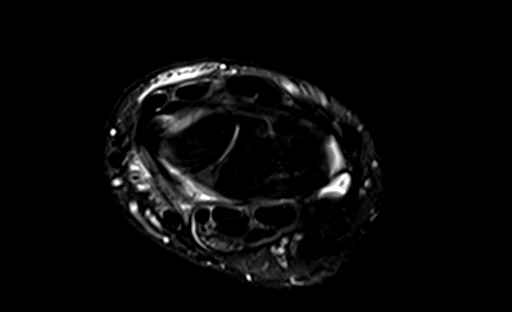
[im 14/20]
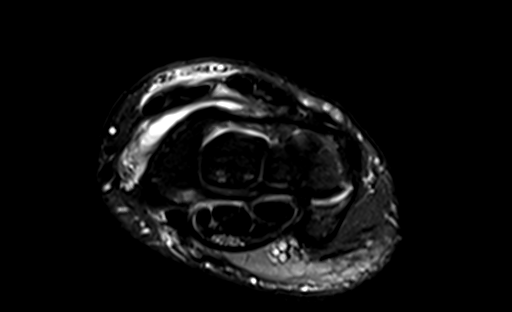
[im 16/20]
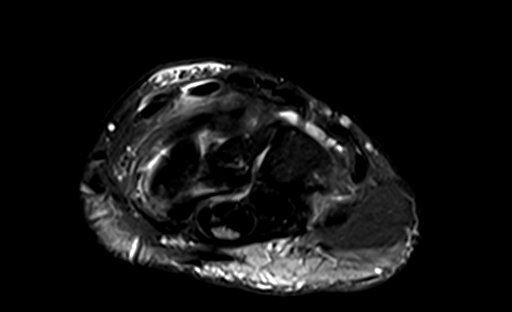
[im 18/20]
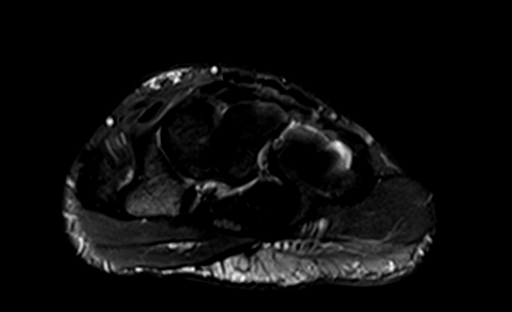
[im 20/20]
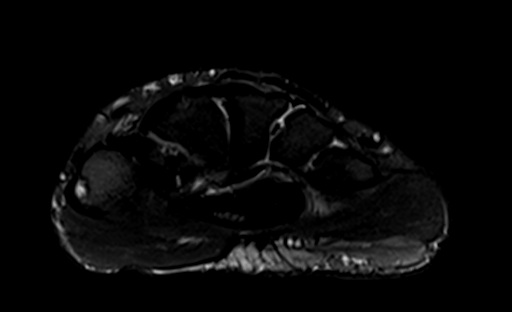

[Series 5: T1 fat-sat · coronal · 2.5mm · 0.31mm/px · 5 of 15 slices shown (1 of 2)]
[im 1/15]
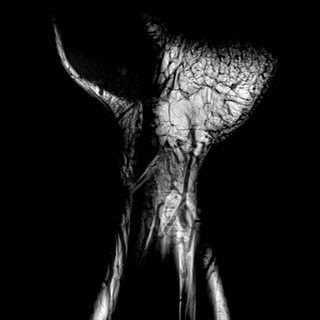
[im 3/15]
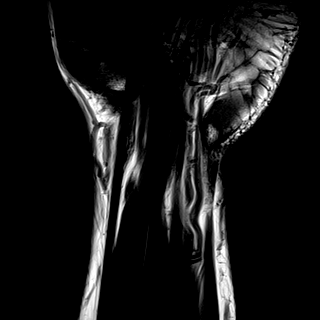
[im 5/15]
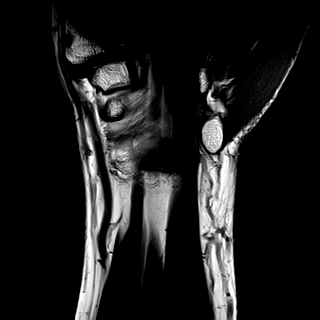
[im 8/15]
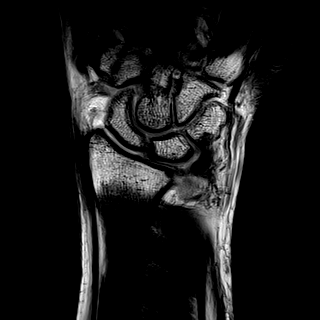
[im 12/15]
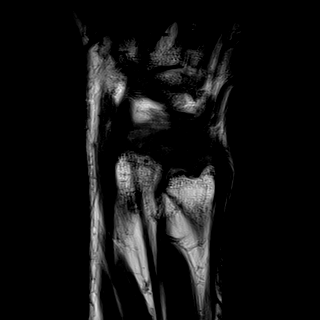

[Series 6: T2 fat-sat · coronal · 2.5mm · 0.20mm/px · 7 of 15 slices shown (2 of 2)]
[im 1/15]
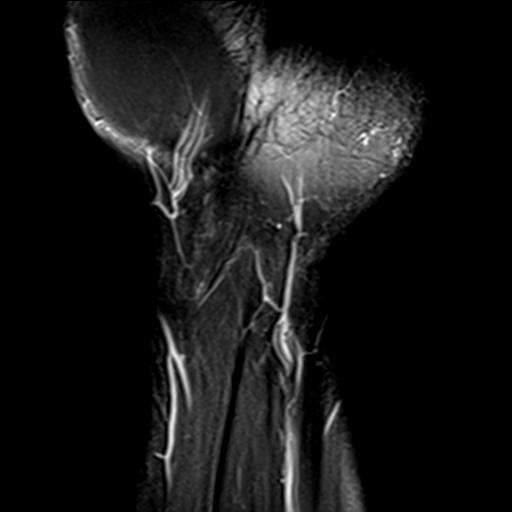
[im 3/15]
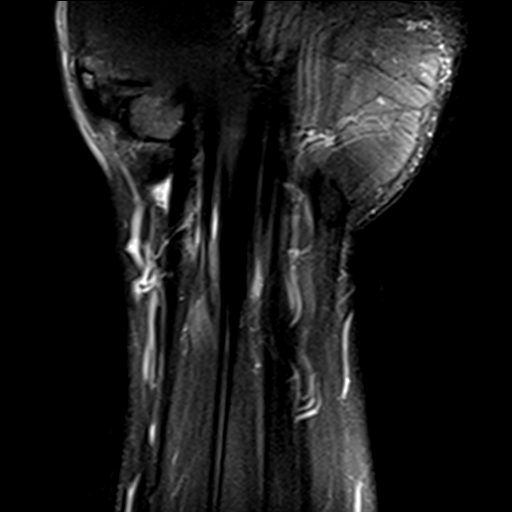
[im 5/15]
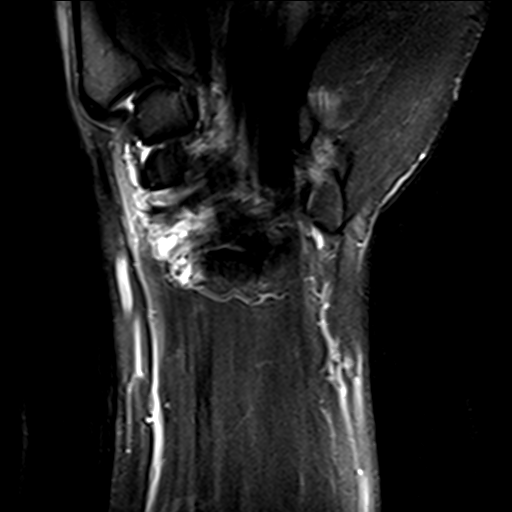
[im 8/15]
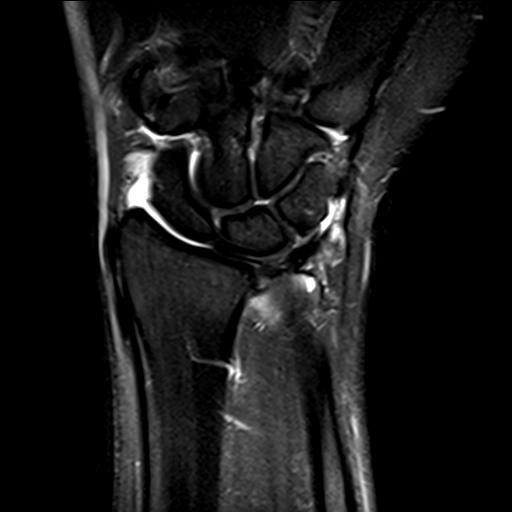
[im 10/15]
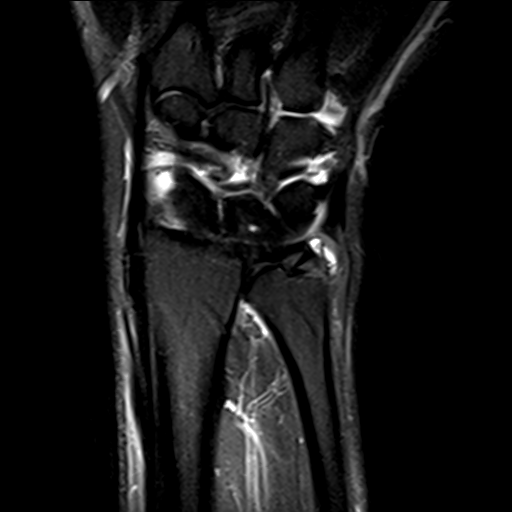
[im 12/15]
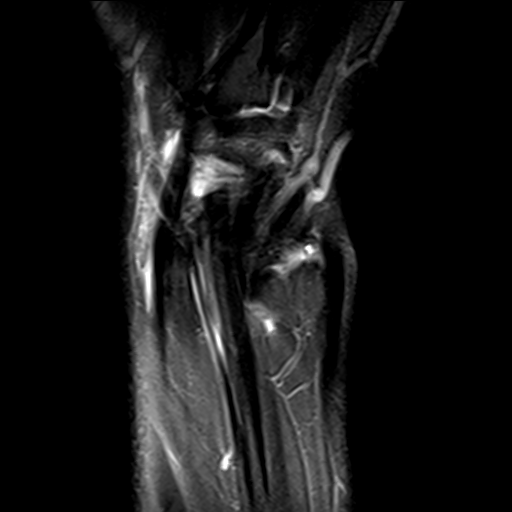
[im 15/15]
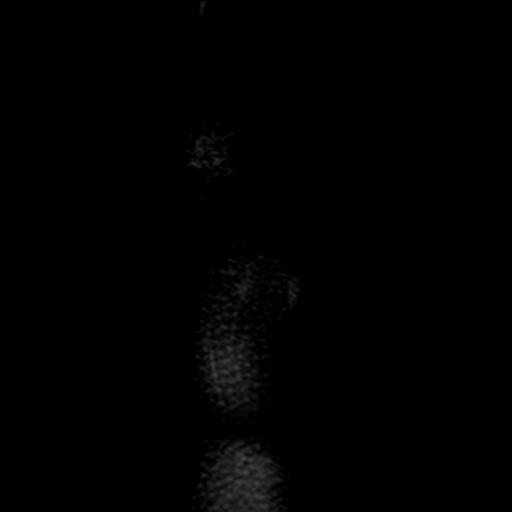

[Series 7: T1 fat-sat · coronal · 2.5mm · 0.20mm/px · 3 of 15 slices shown (2 of 2)]
[im 3/15]
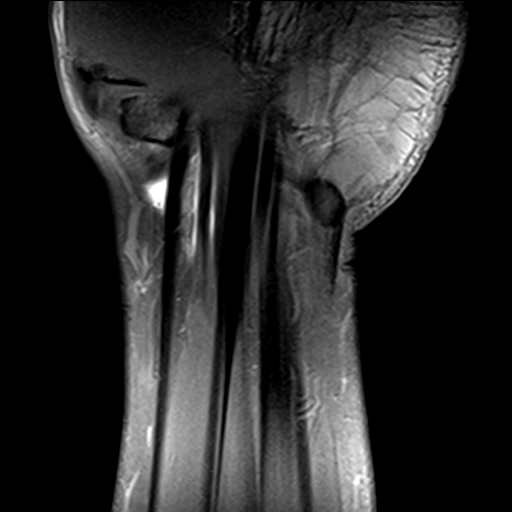
[im 8/15]
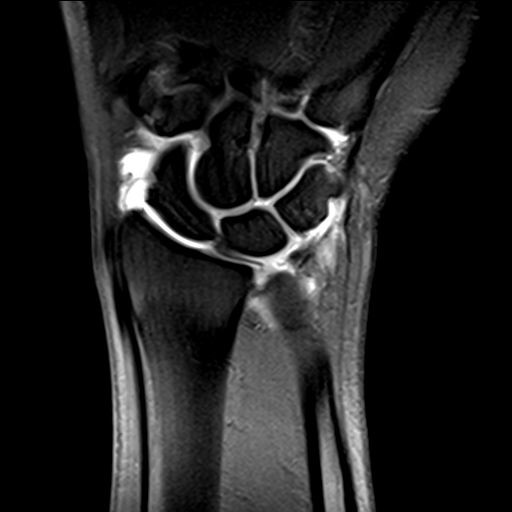
[im 12/15]
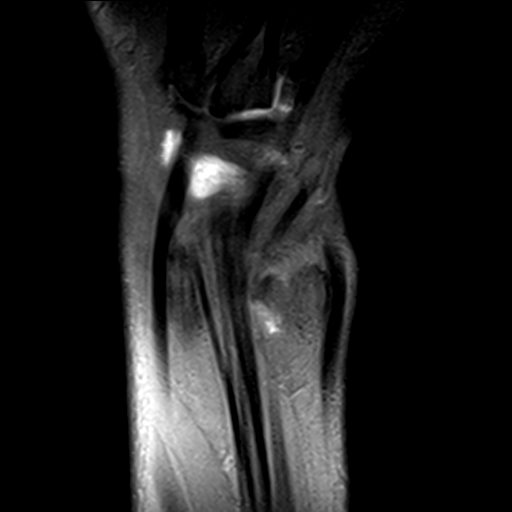

[26 of 40 positions shown; findings below may reference images not displayed]

FINDINGS: Ligaments: A tiny amount of contrast is seen in the midcarpal
compartment. The dorsal band of the scapholunate ligament is well
seen and intact. There appears to be a tear of the lunatotriquetral
ligament which may involve the volar band.

Triangular fibrocartilage: The TFC is markedly degenerated with a
large tear in the disc. Contrast extravasates into the distal
radioulnar joint.

Tendons: Unremarkable.

Carpal tunnel/median nerve: Unremarkable.

Guyon's canal: Unremarkable.

Joint/cartilage: Unremarkable.

Bones/carpal alignment: Ulnar minus variance is noted. No fracture
or worrisome marrow lesion.

Other: None.
IMPRESSION: Large tear of the disc of the triangular fibrocartilage.

Findings suspicious for tear of the lunotriquetral ligament
involving the volar band. The scapholunate ligament is intact.

Ulnar minus variance.

## 2017-09-02 ENCOUNTER — Other Ambulatory Visit: Payer: Self-pay

## 2017-09-02 ENCOUNTER — Telehealth: Payer: Self-pay | Admitting: Internal Medicine

## 2017-09-02 DIAGNOSIS — J3089 Other allergic rhinitis: Secondary | ICD-10-CM

## 2017-09-02 MED ORDER — FLUTICASONE PROPIONATE 50 MCG/ACT NA SUSP: 2.0000 | Freq: Every day | NASAL | 11 refills | Status: DC

## 2017-09-02 NOTE — Telephone Encounter (Signed)
Rx sent to pharmacy.  Please notify patient.

## 2017-09-02 NOTE — Telephone Encounter (Signed)
Patient notified

## 2017-09-02 NOTE — Telephone Encounter (Signed)
Patient needs refill of fluticasone(FLONASE) 50 mcg/ACT nasal spray.

## 2017-09-11 ENCOUNTER — Encounter: Payer: Self-pay | Admitting: Internal Medicine

## 2017-09-11 ENCOUNTER — Ambulatory Visit (INDEPENDENT_AMBULATORY_CARE_PROVIDER_SITE_OTHER): Payer: Medicaid Other | Admitting: Internal Medicine

## 2017-09-11 VITALS — BP 122/80 | HR 76 | Resp 12 | Ht 61.0 in | Wt 160.0 lb

## 2017-09-11 DIAGNOSIS — B373 Candidiasis of vulva and vagina: Secondary | ICD-10-CM | POA: Diagnosis not present

## 2017-09-11 DIAGNOSIS — N898 Other specified noninflammatory disorders of vagina: Secondary | ICD-10-CM | POA: Diagnosis not present

## 2017-09-11 DIAGNOSIS — B3731 Acute candidiasis of vulva and vagina: Secondary | ICD-10-CM

## 2017-09-11 LAB — POCT WET PREP WITH KOH
CLUE CELLS WET PREP PER HPF POC: NEGATIVE
KOH Prep POC: NEGATIVE
RBC Wet Prep HPF POC: NEGATIVE
Trichomonas, UA: NEGATIVE
WBC WET PREP PER HPF POC: NEGATIVE
YEAST WET PREP PER HPF POC: NEGATIVE

## 2017-09-11 MED ORDER — FLUCONAZOLE 150 MG PO TABS: ORAL_TABLET | ORAL | 0 refills | Status: DC

## 2017-09-11 NOTE — Progress Notes (Signed)
   Subjective:    Patient ID: [name], female    DOB: October 22, 1971, 46 y.o.   MRN: [id number]  HPI   Vaginal itching and a bit of clear discharge mixed with blood for the past 3 weeks.  Has tried the 7 day topical vaginal cream treatment.  Had relief for 2-3 days, then tried 3 day treatment for a couple of days worth of improvement.  Not noting any discharge since the treatment.   No intercourse for 1 month.   Still using Metronidazole cream once weekly up until this new itching and discharge started about 3 weeks ago.   No odor.   Current Meds  Medication Sig  . fexofenadine (ALLEGRA) 180 MG tablet Take 1 tablet (180 mg total) by mouth daily.  Marland Kitchen FLUoxetine (PROZAC) 40 MG capsule Take 1 capsule (40 mg total) by mouth daily.  . fluticasone (FLONASE) 50 MCG/ACT nasal spray Place 2 sprays into both nostrils daily.  . metroNIDAZOLE (METROGEL) 0.75 % vaginal gel 1 applicatorful intravaginally  once  weekly for 3  months  . Multiple Vitamin (MULTIVITAMIN WITH MINERALS) TABS Take 1 tablet by mouth daily.  . traZODone (DESYREL) 50 MG tablet Take 1 tablet (50 mg total) by mouth at bedtime.    No Known Allergies    Review of Systems     Objective:   Physical Exam  NAD Abd:  S, NT, No HSM or mass, + BS. GU:  Mild external genitalia fissuring and inflammation with adherent white discharge.  No cervical lesion or inflammation. Minimal old blood in proximal vaginal canal, no other real discharge.  No CMT or uterine or adnexal mass or tenderness.   Wet prep unremarkable.     Assessment & Plan:  1.  Probable mild residual vaginal yeast infection;  Fluconazole 150 mg daily for 2 days.   2.  BV:  Has been off weekly vaginal Metronidazole for 3 weeks and no signs of this currently, but appears to have had a yeast infection.  Hold treatment for now as has received about 6 months treatment with this last course.

## 2017-09-11 NOTE — Addendum Note (Signed)
Addended by: Marcelino Duster on: 09/11/2017 04:18 PM   Modules accepted: Orders

## 2017-09-19 ENCOUNTER — Telehealth: Payer: Self-pay | Admitting: Internal Medicine

## 2017-09-19 NOTE — Telephone Encounter (Signed)
Patient called stating is done with Diflucan 150 MG and still having the symptoms. Patient stated stills having itchy in the external vaginal area since last OV on 09/11/17. Patient will like to know if anything else she can take or use for itch.  To Cherice to advise.

## 2017-09-23 MED ORDER — FLUCONAZOLE 150 MG PO TABS: ORAL_TABLET | ORAL | 0 refills | Status: DC

## 2017-09-23 NOTE — Telephone Encounter (Signed)
Spoke with patient. Rx for 1 dose sent to [facility]

## 2017-09-23 NOTE — Telephone Encounter (Signed)
Patient needs refill on Diflucan 150 mg. Tablet.  Patient wants Rx called into [facility].  Patient can be reached at [phone]

## 2017-11-27 ENCOUNTER — Other Ambulatory Visit: Payer: Self-pay

## 2017-11-27 ENCOUNTER — Telehealth: Payer: Self-pay | Admitting: Internal Medicine

## 2017-11-27 MED ORDER — TRAZODONE HCL 50 MG PO TABS: 50.0000 mg | ORAL_TABLET | Freq: Every day | ORAL | 11 refills | Status: DC

## 2017-11-27 MED ORDER — FLUOXETINE HCL 40 MG PO CAPS: 40.0000 mg | ORAL_CAPSULE | Freq: Every day | ORAL | 11 refills | Status: DC

## 2017-11-27 NOTE — Telephone Encounter (Signed)
Patient needs refill of traZODone (DESYREL) 50 mg tablet.  Patient wants called into [facility].

## 2017-11-27 NOTE — Telephone Encounter (Signed)
Rx sent to pharmacy   

## 2017-11-27 NOTE — Telephone Encounter (Signed)
Patient needs refill for FLUoxetine (PROZAC)40 mg capsule.  Patient would liked called into [facility].

## 2018-04-29 ENCOUNTER — Ambulatory Visit: Payer: Medicaid Other | Admitting: Internal Medicine

## 2018-04-29 ENCOUNTER — Encounter: Payer: Self-pay | Admitting: Internal Medicine

## 2018-04-29 VITALS — BP 136/92 | HR 78 | Resp 12 | Ht 61.0 in | Wt 169.0 lb

## 2018-04-29 DIAGNOSIS — N898 Other specified noninflammatory disorders of vagina: Secondary | ICD-10-CM | POA: Diagnosis not present

## 2018-04-29 DIAGNOSIS — S8992XA Unspecified injury of left lower leg, initial encounter: Secondary | ICD-10-CM | POA: Diagnosis not present

## 2018-04-29 LAB — POCT WET PREP WITH KOH
KOH PREP POC: NEGATIVE
RBC WET PREP PER HPF POC: NEGATIVE
Trichomonas, UA: NEGATIVE
Yeast Wet Prep HPF POC: NEGATIVE

## 2018-04-29 MED ORDER — FLUCONAZOLE 150 MG PO TABS: 150.0000 mg | ORAL_TABLET | Freq: Once | ORAL | 0 refills | Status: AC

## 2018-04-29 MED ORDER — HYDROCODONE-ACETAMINOPHEN 5-325 MG PO TABS
1.0000 | ORAL_TABLET | Freq: Four times a day (QID) | ORAL | 0 refills | Status: AC | PRN
Start: 2018-04-29 — End: 2018-05-04

## 2018-04-29 MED ORDER — NAPROXEN 500 MG PO TABS: 500.0000 mg | ORAL_TABLET | Freq: Two times a day (BID) | ORAL | 1 refills | Status: DC

## 2018-04-29 NOTE — Patient Instructions (Addendum)
Keep knee elevated with soft support beneath. Use crutches for ambulation and knee brace when up and about.  Ice knee for 20 minutes every 2 hours.

## 2018-04-29 NOTE — Addendum Note (Signed)
Addended by: Marcelino Duster on: 04/29/2018 03:50 PM   Modules accepted: Orders

## 2018-04-29 NOTE — Progress Notes (Signed)
   Subjective:    Patient ID: [name], female    DOB: 04/08/1971, 47 y.o.   MRN: [id number]  HPI   Tripped down stairs Sunday early in the morning, ultimately landing on anterior left knee.  Knee is bruised from the injury. Knee started swelling almost immediately.  Did not really hurt until later in the afternoon.   Took three 325 mg ASA without improvement in pain. Ibuprofen 200 mg, 2 tabs every 4 hours without help improving the pain. Did apply ice to the knee for a prolonged period of time. Did elevate knee and leg on the sofa much of the day and actually since the injury. Able to bear weight, but painful.   Pain with both flexion and extension of the knee.  Just before exam:  Having vaginal itching and white discharge with musky odor for 2 weeks.  Used 7 day Miconazole vaginal cream without improvement--finished that 3 days ago. Long term female partner without symptoms.  Current Meds  Medication Sig  . fexofenadine (ALLEGRA) 180 MG tablet Take 1 tablet (180 mg total) by mouth daily.  Marland Kitchen FLUoxetine (PROZAC) 40 MG capsule Take 1 capsule (40 mg total) by mouth daily.  . fluticasone (FLONASE) 50 MCG/ACT nasal spray Place 2 sprays into both nostrils daily.  Marland Kitchen ibuprofen (ADVIL,MOTRIN) 600 MG tablet Take 1 tablet (600 mg total) by mouth every 8 (eight) hours as needed (with food).  . Multiple Vitamin (MULTIVITAMIN WITH MINERALS) TABS Take 1 tablet by mouth daily.  . traZODone (DESYREL) 50 MG tablet Take 1 tablet (50 mg total) by mouth at bedtime.    No Known Allergies  Review of Systems     Objective:   Physical Exam  Left knee diffusely swollen.  Unable to flex or extend beyond about 120 degrees.  Significant contusion anteriorly. Mild tenderness on either side of patellar tendon, but appears intact.  No definite increase in pain with stress of collateral ligaments.  Unable to relax or flex knee enough to perform stress maneuvers of cruciate ligaments.  Mild tenderness  over lateral collateral ligament area.   NT in popliteal fossa.  Normal DP and PT pulses, left foot.  GU:  Normal external genitalia.  No significant vaginal mucosa inflammation.  Scant thin white vaginal discharge. No CMT.  Wet prep:  A few clue cells with negative whiff.  No obvious yeast.  No trichomonas.        Assessment & Plan:  1.  Left knee injury:  Concern for internal derangement with amount of swelling.  Referral to Dr. Rhona Raider who appears to have cared for her with meniscal injury of same knee back in 2004. Elevate, ice, knee immobilizer, given crutches. Hydrocodone mainly for bedtime Naproxen 500 mg twice daily with meals  2.  Vaginal discharge with few findings:  Suspect partially treated yeast.  Fluconazole 150 mg tab x 1 dose

## 2018-05-07 ENCOUNTER — Telehealth: Payer: Self-pay

## 2018-05-07 MED ORDER — FLUCONAZOLE 150 MG PO TABS: ORAL_TABLET | ORAL | 0 refills | Status: DC

## 2018-05-07 NOTE — Telephone Encounter (Signed)
Patient called and states the medication Dr. Amil Amen put her on for vaginal itching is not working. patient states she took Diflucan and she is still having symptoms. States the itching did get better when she took the medicine, but after it has worn off the symptoms have come back with itching and burning on inside of vagina. Patient wants to know what she should do now.  To Dr. Amil Amen for further direction.

## 2018-05-07 NOTE — Telephone Encounter (Signed)
Per Dr. Amil Amen have patient do Fluconazole 150 mg 1 daily for 3 days. Patient has been informed and Rx sent to [facility]

## 2018-07-03 ENCOUNTER — Ambulatory Visit: Payer: Self-pay | Admitting: Orthopedic Surgery

## 2018-07-14 ENCOUNTER — Encounter (HOSPITAL_BASED_OUTPATIENT_CLINIC_OR_DEPARTMENT_OTHER): Payer: Self-pay | Admitting: *Deleted

## 2018-07-14 NOTE — H&P (View-Only) (Signed)
Spoke w/ pt via phone for pre-op interview.  Npo after mn w/ exception clear liquids until 0700 (no cream/milk products).  Arrive at 1100.  Needs urine preg.  Will taka prozac and do flonase am dos w/ sips of water.

## 2018-07-14 NOTE — Progress Notes (Signed)
Spoke w/ pt via phone for pre-op interview.  Npo after mn w/ exception clear liquids until 0700 (no cream/milk products).  Arrive at 1100.  Needs urine preg.  Will taka prozac and do flonase am dos w/ sips of water.

## 2018-07-24 ENCOUNTER — Ambulatory Visit (HOSPITAL_BASED_OUTPATIENT_CLINIC_OR_DEPARTMENT_OTHER)
Admission: RE | Admit: 2018-07-24 | Discharge: 2018-07-24 | Disposition: A | Discharge status: Home / Self Care | Payer: Medicaid Other | Source: Ambulatory Visit | Attending: Specialist | Admitting: Specialist

## 2018-07-24 ENCOUNTER — Encounter (HOSPITAL_BASED_OUTPATIENT_CLINIC_OR_DEPARTMENT_OTHER)
Admission: RE | Disposition: A | Discharge status: Home / Self Care | Payer: Self-pay | Source: Ambulatory Visit | Attending: Specialist

## 2018-07-24 ENCOUNTER — Ambulatory Visit (HOSPITAL_BASED_OUTPATIENT_CLINIC_OR_DEPARTMENT_OTHER): Payer: Medicaid Other | Admitting: Anesthesiology

## 2018-07-24 ENCOUNTER — Encounter (HOSPITAL_BASED_OUTPATIENT_CLINIC_OR_DEPARTMENT_OTHER): Payer: Self-pay

## 2018-07-24 DIAGNOSIS — X58XXXA Exposure to other specified factors, initial encounter: Secondary | ICD-10-CM | POA: Insufficient documentation

## 2018-07-24 DIAGNOSIS — Z87891 Personal history of nicotine dependence: Secondary | ICD-10-CM | POA: Insufficient documentation

## 2018-07-24 DIAGNOSIS — Z7982 Long term (current) use of aspirin: Secondary | ICD-10-CM | POA: Diagnosis not present

## 2018-07-24 DIAGNOSIS — T8489XA Other specified complication of internal orthopedic prosthetic devices, implants and grafts, initial encounter: Secondary | ICD-10-CM | POA: Diagnosis present

## 2018-07-24 DIAGNOSIS — F329 Major depressive disorder, single episode, unspecified: Secondary | ICD-10-CM | POA: Insufficient documentation

## 2018-07-24 DIAGNOSIS — Z79899 Other long term (current) drug therapy: Secondary | ICD-10-CM | POA: Insufficient documentation

## 2018-07-24 DIAGNOSIS — I10 Essential (primary) hypertension: Secondary | ICD-10-CM | POA: Diagnosis not present

## 2018-07-24 DIAGNOSIS — I252 Old myocardial infarction: Secondary | ICD-10-CM | POA: Insufficient documentation

## 2018-07-24 DIAGNOSIS — Z7951 Long term (current) use of inhaled steroids: Secondary | ICD-10-CM | POA: Insufficient documentation

## 2018-07-24 DIAGNOSIS — S83232A Complex tear of medial meniscus, current injury, left knee, initial encounter: Secondary | ICD-10-CM | POA: Diagnosis not present

## 2018-07-24 DIAGNOSIS — S83272A Complex tear of lateral meniscus, current injury, left knee, initial encounter: Secondary | ICD-10-CM | POA: Diagnosis not present

## 2018-07-24 DIAGNOSIS — Z9889 Other specified postprocedural states: Secondary | ICD-10-CM

## 2018-07-24 HISTORY — DX: Personal history of other endocrine, nutritional and metabolic disease: Z86.39

## 2018-07-24 HISTORY — PX: ANTERIOR CRUCIATE LIGAMENT (ACL) REVISION: SHX6707

## 2018-07-24 HISTORY — DX: Personal history of diseases of the blood and blood-forming organs and certain disorders involving the immune mechanism: Z86.2

## 2018-07-24 HISTORY — DX: Personal history of other diseases of the circulatory system: Z86.79

## 2018-07-24 HISTORY — DX: Personal history of other complications of pregnancy, childbirth and the puerperium: Z87.59

## 2018-07-24 HISTORY — DX: Old myocardial infarction: I25.2

## 2018-07-24 LAB — POCT PREGNANCY, URINE: Preg Test, Ur: NEGATIVE

## 2018-07-24 SURGERY — REVISION, RECONSTRUCTION, KNEE, ACL
Anesthesia: General | Site: Knee | Laterality: Left

## 2018-07-24 MED ORDER — DEXAMETHASONE SODIUM PHOSPHATE 10 MG/ML IJ SOLN
INTRAMUSCULAR | Status: AC
  Filled 2018-07-24: qty 1

## 2018-07-24 MED ORDER — ONDANSETRON HCL 4 MG/2ML IJ SOLN
INTRAMUSCULAR | Status: DC | PRN
  Administered 2018-07-24: 4 mg via INTRAVENOUS

## 2018-07-24 MED ORDER — LIDOCAINE 2% (20 MG/ML) 5 ML SYRINGE
INTRAMUSCULAR | Status: DC | PRN
  Administered 2018-07-24: 60 mg via INTRAVENOUS

## 2018-07-24 MED ORDER — KETOROLAC TROMETHAMINE 30 MG/ML IJ SOLN
30.0000 mg | Freq: Once | INTRAMUSCULAR | Status: DC | PRN
  Filled 2018-07-24: qty 1

## 2018-07-24 MED ORDER — PROPOFOL 10 MG/ML IV BOLUS
INTRAVENOUS | Status: AC
  Filled 2018-07-24: qty 20

## 2018-07-24 MED ORDER — OXYCODONE HCL 5 MG/5ML PO SOLN
5.0000 mg | Freq: Once | ORAL | Status: AC | PRN
  Filled 2018-07-24: qty 5

## 2018-07-24 MED ORDER — ONDANSETRON HCL 4 MG/2ML IJ SOLN
4.0000 mg | Freq: Once | INTRAMUSCULAR | Status: DC | PRN
  Filled 2018-07-24: qty 2

## 2018-07-24 MED ORDER — PROPOFOL 10 MG/ML IV BOLUS
INTRAVENOUS | Status: DC | PRN
  Administered 2018-07-24: 150 mg via INTRAVENOUS
  Administered 2018-07-24: 50 mg via INTRAVENOUS
  Administered 2018-07-24: 30 mg via INTRAVENOUS

## 2018-07-24 MED ORDER — FENTANYL CITRATE (PF) 100 MCG/2ML IJ SOLN
INTRAMUSCULAR | Status: DC | PRN
  Administered 2018-07-24 (×4): 25 ug via INTRAVENOUS

## 2018-07-24 MED ORDER — ROPIVACAINE HCL 7.5 MG/ML IJ SOLN
INTRAMUSCULAR | Status: DC | PRN
  Administered 2018-07-24 (×6): 5 mL via PERINEURAL

## 2018-07-24 MED ORDER — MORPHINE SULFATE (PF) 4 MG/ML IV SOLN
INTRAVENOUS | Status: DC | PRN
  Administered 2018-07-24: 4 mg via INTRAMUSCULAR

## 2018-07-24 MED ORDER — CEFAZOLIN SODIUM-DEXTROSE 2-4 GM/100ML-% IV SOLN
INTRAVENOUS | Status: AC
  Filled 2018-07-24: qty 100

## 2018-07-24 MED ORDER — OXYCODONE HCL 5 MG PO TABS
ORAL_TABLET | ORAL | Status: AC
  Filled 2018-07-24: qty 1

## 2018-07-24 MED ORDER — MEPERIDINE HCL 25 MG/ML IJ SOLN
INTRAMUSCULAR | Status: AC
  Filled 2018-07-24: qty 1

## 2018-07-24 MED ORDER — MEPERIDINE HCL 25 MG/ML IJ SOLN
6.2500 mg | INTRAMUSCULAR | Status: DC | PRN
Start: 2018-07-24 — End: 2018-07-24
  Filled 2018-07-24: qty 1

## 2018-07-24 MED ORDER — FENTANYL CITRATE (PF) 100 MCG/2ML IJ SOLN
INTRAMUSCULAR | Status: AC
  Filled 2018-07-24: qty 2

## 2018-07-24 MED ORDER — ONDANSETRON HCL 4 MG/2ML IJ SOLN
INTRAMUSCULAR | Status: AC
Start: 2018-07-24 — End: ?
  Filled 2018-07-24: qty 2

## 2018-07-24 MED ORDER — METHOCARBAMOL 500 MG PO TABS: 500.0000 mg | ORAL_TABLET | Freq: Three times a day (TID) | ORAL | 1 refills | Status: DC | PRN

## 2018-07-24 MED ORDER — OXYCODONE HCL 5 MG PO TABS
5.0000 mg | ORAL_TABLET | Freq: Once | ORAL | Status: AC | PRN
  Administered 2018-07-24: 5 mg via ORAL
  Filled 2018-07-24: qty 1

## 2018-07-24 MED ORDER — MIDAZOLAM HCL 2 MG/2ML IJ SOLN
INTRAMUSCULAR | Status: AC
  Filled 2018-07-24: qty 2

## 2018-07-24 MED ORDER — IBUPROFEN 100 MG/5ML PO SUSP
200.0000 mg | Freq: Four times a day (QID) | ORAL | Status: DC | PRN
  Filled 2018-07-24: qty 30

## 2018-07-24 MED ORDER — OXYCODONE HCL 5 MG PO TABS: 5.0000 mg | ORAL_TABLET | ORAL | 0 refills | Status: AC | PRN

## 2018-07-24 MED ORDER — MORPHINE SULFATE (PF) 4 MG/ML IV SOLN
INTRAVENOUS | Status: AC
  Filled 2018-07-24: qty 1

## 2018-07-24 MED ORDER — CHLORHEXIDINE GLUCONATE 4 % EX LIQD
60.0000 mL | Freq: Once | CUTANEOUS | Status: DC
  Filled 2018-07-24: qty 118

## 2018-07-24 MED ORDER — LACTATED RINGERS IV SOLN
INTRAVENOUS | Status: DC
  Administered 2018-07-24 (×2): via INTRAVENOUS
  Filled 2018-07-24: qty 1000

## 2018-07-24 MED ORDER — SODIUM CHLORIDE 0.9 % IR SOLN
Status: DC | PRN
  Administered 2018-07-24: 24000 mL

## 2018-07-24 MED ORDER — PROPOFOL 500 MG/50ML IV EMUL
INTRAVENOUS | Status: AC
  Filled 2018-07-24: qty 50

## 2018-07-24 MED ORDER — CEFAZOLIN SODIUM-DEXTROSE 2-4 GM/100ML-% IV SOLN
2.0000 g | INTRAVENOUS | Status: AC
  Administered 2018-07-24: 2 g via INTRAVENOUS
  Filled 2018-07-24: qty 100

## 2018-07-24 MED ORDER — IBUPROFEN 200 MG PO TABS
200.0000 mg | ORAL_TABLET | Freq: Four times a day (QID) | ORAL | Status: DC | PRN
  Filled 2018-07-24: qty 2

## 2018-07-24 MED ORDER — ASPIRIN EC 325 MG PO TBEC: 325.0000 mg | DELAYED_RELEASE_TABLET | Freq: Two times a day (BID) | ORAL | 0 refills | Status: DC

## 2018-07-24 MED ORDER — LIDOCAINE 2% (20 MG/ML) 5 ML SYRINGE
INTRAMUSCULAR | Status: AC
  Filled 2018-07-24: qty 5

## 2018-07-24 MED ORDER — BUPIVACAINE HCL (PF) 0.25 % IJ SOLN
INTRAMUSCULAR | Status: DC | PRN
  Administered 2018-07-24: 30 mL

## 2018-07-24 MED ORDER — DEXAMETHASONE SODIUM PHOSPHATE 10 MG/ML IJ SOLN
INTRAMUSCULAR | Status: DC | PRN
  Administered 2018-07-24: 10 mg via INTRAVENOUS

## 2018-07-24 MED ORDER — FENTANYL CITRATE (PF) 100 MCG/2ML IJ SOLN
25.0000 ug | INTRAMUSCULAR | Status: DC | PRN
  Filled 2018-07-24: qty 1

## 2018-07-24 MED ORDER — WHITE PETROLATUM EX OINT
TOPICAL_OINTMENT | CUTANEOUS | Status: AC
  Filled 2018-07-24: qty 5

## 2018-07-24 SURGICAL SUPPLY — 93 items
ALLOGRAFT GRFTLNK IMPLANT SYST (Anchor) ×1 IMPLANT
BANDAGE ELASTIC 6 VELCRO ST LF (GAUZE/BANDAGES/DRESSINGS) ×3 IMPLANT
BANDAGE ESMARK 6X9 LF (GAUZE/BANDAGES/DRESSINGS) ×1 IMPLANT
BLADE CUDA GRT WHITE 3.5 (BLADE) ×3 IMPLANT
BLADE GREAT WHITE 4.2 (BLADE) IMPLANT
BLADE GREAT WHITE 4.2MM (BLADE)
BLADE GREAT WHITE SHAVER 5.5 (BLADE) ×2 IMPLANT
BLADE GREAT WHITE SHAVER 5.5MM (BLADE) ×1
BLADE SURG 10 STRL SS (BLADE) ×3 IMPLANT
BLADE SURG 15 STRL LF DISP TIS (BLADE) ×1 IMPLANT
BLADE SURG 15 STRL SS (BLADE) ×2
BNDG ESMARK 6X9 LF (GAUZE/BANDAGES/DRESSINGS) ×3
BNDG GAUZE ELAST 4 BULKY (GAUZE/BANDAGES/DRESSINGS) ×3 IMPLANT
BUR OVAL 6.0 (BURR) IMPLANT
BUR VERTEX HOODED 4.5 (BURR) ×3 IMPLANT
BUTTON EXT TIGHTROPE 5X20 (Orthopedic Implant) ×2 IMPLANT
BUTTON EXT TIGHTROPE 5X20MM (Orthopedic Implant) ×1 IMPLANT
CANISTER SUCTION 1200CC (MISCELLANEOUS) ×3 IMPLANT
CLOSURE WOUND 1/2 X4 (GAUZE/BANDAGES/DRESSINGS)
COVER BACK TABLE 60X90IN (DRAPES) ×3 IMPLANT
CUFF TOURNIQUET SINGLE 34IN LL (TOURNIQUET CUFF) ×3 IMPLANT
CUTTER FLIP II 9.5MM (INSTRUMENTS) ×3 IMPLANT
DRAPE ARTHROSCOPY W/POUCH 114 (DRAPES) ×3 IMPLANT
DRAPE INCISE IOBAN 66X45 STRL (DRAPES) ×3 IMPLANT
DRAPE LG THREE QUARTER DISP (DRAPES) ×3 IMPLANT
DRAPE OEC MINIVIEW 54X84 (DRAPES) ×3 IMPLANT
DRAPE U-SHAPE 47X51 STRL (DRAPES) ×3 IMPLANT
DRILL FLIPCUTTER II 9.0MM (INSTRUMENTS) ×1 IMPLANT
DURAPREP 26ML APPLICATOR (WOUND CARE) ×3 IMPLANT
ELECT REM PT RETURN 9FT ADLT (ELECTROSURGICAL) ×3
ELECTRODE REM PT RTRN 9FT ADLT (ELECTROSURGICAL) ×1 IMPLANT
FIBERSTICK 2 (SUTURE) IMPLANT
FLIPCUTTER II 9.0MM (INSTRUMENTS) ×3
GAUZE SPONGE 4X4 12PLY STRL (GAUZE/BANDAGES/DRESSINGS) ×3 IMPLANT
GAUZE SPONGE 4X4 12PLY STRL LF (GAUZE/BANDAGES/DRESSINGS) ×6 IMPLANT
GAUZE XEROFORM 1X8 LF (GAUZE/BANDAGES/DRESSINGS) ×3 IMPLANT
GEL DBM ALLOSYNC 1CC (Bone Implant) ×6 IMPLANT
GLOVE BIO SURGEON STRL SZ7.5 (GLOVE) ×3 IMPLANT
GLOVE BIO SURGEON STRL SZ8 (GLOVE) ×3 IMPLANT
GLOVE BIOGEL PI IND STRL 6.5 (GLOVE) ×2 IMPLANT
GLOVE BIOGEL PI IND STRL 7.5 (GLOVE) ×2 IMPLANT
GLOVE BIOGEL PI INDICATOR 6.5 (GLOVE) ×4
GLOVE BIOGEL PI INDICATOR 7.5 (GLOVE) ×4
GLOVE INDICATOR 8.0 STRL GRN (GLOVE) ×6 IMPLANT
GOWN STRL REUS W/TWL XL LVL3 (GOWN DISPOSABLE) ×6 IMPLANT
IMMOBILIZER KNEE 24 THIGH 36 (MISCELLANEOUS) ×1 IMPLANT
IMMOBILIZER KNEE 24 UNIV (MISCELLANEOUS) ×3
IV NS IRRIG 3000ML ARTHROMATIC (IV SOLUTION) ×36 IMPLANT
KIT BIOCARTILAGE LG JOINT MIX (KITS) ×3 IMPLANT
KIT TURNOVER CYSTO (KITS) ×3 IMPLANT
KNEE WRAP E Z 3 GEL PACK (MISCELLANEOUS) ×3 IMPLANT
MANIFOLD NEPTUNE II (INSTRUMENTS) ×3 IMPLANT
NEEDLE ELECTRODE (NEEDLE) IMPLANT
NEEDLE HYPO 22GX1.5 SAFETY (NEEDLE) IMPLANT
PACK ARTHROSCOPY DSU (CUSTOM PROCEDURE TRAY) ×3 IMPLANT
PACK BASIN DAY SURGERY FS (CUSTOM PROCEDURE TRAY) ×3 IMPLANT
PAD ABD 8X10 STRL (GAUZE/BANDAGES/DRESSINGS) ×3 IMPLANT
PAD ARMBOARD 7.5X6 YLW CONV (MISCELLANEOUS) IMPLANT
PAD CAST 4YDX4 CTTN HI CHSV (CAST SUPPLIES) ×1 IMPLANT
PADDING CAST COTTON 4X4 STRL (CAST SUPPLIES) ×2
PENCIL BUTTON HOLSTER BLD 10FT (ELECTRODE) IMPLANT
PK GRAFTLINK ALLO IMPLANT SYST (Anchor) ×3 IMPLANT
PROBE BIPOLAR 50 DEGREE SUCT (MISCELLANEOUS) ×3 IMPLANT
SET ARTHROSCOPY TUBING (MISCELLANEOUS) ×2
SET ARTHROSCOPY TUBING LN (MISCELLANEOUS) ×1 IMPLANT
SET PAD KNEE POSITIONER (MISCELLANEOUS) ×3 IMPLANT
SLEEVE SURGEON STRL (DRAPES) ×3 IMPLANT
SPONGE LAP 4X18 RFD (DISPOSABLE) ×3 IMPLANT
STRIP CLOSURE SKIN 1/2X4 (GAUZE/BANDAGES/DRESSINGS) IMPLANT
SUCTION FRAZIER HANDLE 10FR (MISCELLANEOUS) ×2
SUCTION TUBE FRAZIER 10FR DISP (MISCELLANEOUS) ×1 IMPLANT
SUT 2 FIBERLOOP 20 STRT BLUE (SUTURE)
SUT ETHILON 4 0 PS 2 18 (SUTURE) ×6 IMPLANT
SUT FIBERWIRE #2 38 REV NDL BL (SUTURE)
SUT FIBERWIRE #2 38 T-5 BLUE (SUTURE)
SUT MNCRL AB 3-0 PS2 18 (SUTURE) ×3 IMPLANT
SUT VIC AB 0 CT2 27 (SUTURE) ×6 IMPLANT
SUT VIC AB 2-0 CT2 27 (SUTURE) ×3 IMPLANT
SUT VIC AB 2-0 SH 27 (SUTURE) ×4
SUT VIC AB 2-0 SH 27XBRD (SUTURE) ×2 IMPLANT
SUTURE 2 FIBERLOOP 20 STRT BLU (SUTURE) IMPLANT
SUTURE FIBERWR #2 38 T-5 BLUE (SUTURE) IMPLANT
SUTURE FIBERWR#2 38 REV NDL BL (SUTURE) IMPLANT
SUTURE TIGERSTICK 2 TIGERWIR 2 (MISCELLANEOUS) IMPLANT
SYR CONTROL 10ML LL (SYRINGE) ×3 IMPLANT
SYSTEM IMPL ACL/PCL SWIVILLOCK (Anchor) ×3 IMPLANT
TIGERSTICK 2 TIGERWIRE 2 (MISCELLANEOUS)
TISSUE GRAFTLINK FGL (Tissue) ×3 IMPLANT
TOWEL OR 17X24 6PK STRL BLUE (TOWEL DISPOSABLE) ×6 IMPLANT
TUBE CONNECTING 12'X1/4 (SUCTIONS) ×2
TUBE CONNECTING 12X1/4 (SUCTIONS) ×4 IMPLANT
WATER STERILE IRR 500ML POUR (IV SOLUTION) ×3 IMPLANT
allosync dbm gel ×6 IMPLANT

## 2018-07-24 NOTE — Interval H&P Note (Signed)
History and Physical Interval Note:  07/24/2018 11:59 AM  [name]  has presented today for surgery, with the diagnosis of Left knee torn anterior cruciate ligament, meniscal tear  The various methods of treatment have been discussed with the patient and family. After consideration of risks, benefits and other options for treatment, the patient has consented to  Procedure(s) with comments: Left knee arthroscopy, debridement, allograft revision anterior cruciate ligament reconstruction, possible hardware removal, partial menisectomy versus repair (Left) - 2 hrs General with adductor canal block as a surgical intervention .  The patient's history has been reviewed, patient examined, no change in status, stable for surgery.  I have reviewed the patient's chart and labs.  Questions were answered to the patient's satisfaction.     Chayse Zatarain ANDREW

## 2018-07-24 NOTE — Anesthesia Procedure Notes (Signed)
Anesthesia Regional Block: Adductor canal block   Pre-Anesthetic Checklist: ,, timeout performed, Correct Patient, Correct Site, Correct Laterality, Correct Procedure, Correct Position, site marked, Risks and benefits discussed,  Surgical consent,  Pre-op evaluation,  At surgeon's request and post-op pain management  Laterality: Left and Lower  Prep: chloraprep       Needles:  Injection technique: Single-shot  Needle Type: Echogenic Stimulator Needle     Needle Length: 10cm  Needle Gauge: 21   Needle insertion depth: 3 cm   Additional Needles:   Procedures:,,,, ultrasound used (permanent image in chart),,,,  Narrative:  Start time: 07/24/2018 12:42 PM End time: 07/24/2018 12:52 PM Injection made incrementally with aspirations every 5 mL.  Performed by: Personally  Anesthesiologist: Lyn Hollingshead, MD

## 2018-07-24 NOTE — Anesthesia Preprocedure Evaluation (Signed)
Anesthesia Evaluation  Patient identified by MRN, date of birth, ID band Patient awake    Reviewed: Allergy & Precautions, NPO status , Patient's Chart, lab work & pertinent test results  Airway Mallampati: I       Dental no notable dental hx. (+) Teeth Intact   Pulmonary former smoker,    Pulmonary exam normal breath sounds clear to auscultation       Cardiovascular + Past MI  Normal cardiovascular exam Rhythm:Regular Rate:Normal  remote   Neuro/Psych PSYCHIATRIC DISORDERS Depression    GI/Hepatic negative GI ROS, Neg liver ROS,   Endo/Other    Renal/GU      Musculoskeletal   Abdominal (+) + obese,   Peds  Hematology negative hematology ROS (+)   Anesthesia Other Findings BSA:    1.64m^2 Pt. Status: Room:  PERFORMING  [facility], [place], MD ADMITTING  [place], [place], [place] SONOGRAPHER Donneta Romberg cc:  -------------------------------------------------------------------- Indications:  Chest pain 786.51.  -------------------------------------------------------------------- History: PMH: MI remote.  -------------------------------------------------------------------- Study Conclusions  - Left ventricle: The cavity size was normal. Systolic function was  normal. The estimated ejection fraction was in the range of 55% to  60%. Wall motion was normal; there were no regional wall motion  abnormalities. Left ventricular diastolic function parameters were  normal. - Pericardium, extracardiac: A trivial pericardial effusion was  identified. Impressions:  - When compared to prior study results from 02-08-2003, there is no  significant change. Transthoracic echocardiography. M-mode, complete 2D, spectral Doppler, and color Doppler. Height: Height: 162.6cm. Height: 64in. Weight: Weight: 63.5kg. Weight:  139.7lb. Body mass index: BMI: 24kg/m^2. Body surface area: BSA: 1.22m^2. Patient status: Inpatient. Location: Echo laboratory.  --------------------------------------------------------------------  Reproductive/Obstetrics negative OB ROS                             Anesthesia Physical Anesthesia Plan  ASA: II  Anesthesia Plan: General   Post-op Pain Management:  Regional for Post-op pain   Induction: Intravenous  PONV Risk Score and Plan: 3 and Ondansetron, Dexamethasone and Midazolam  Airway Management Planned: LMA  Additional Equipment:   Intra-op Plan:   Post-operative Plan: Extubation in OR  Informed Consent: I have reviewed the patients History and Physical, chart, labs and discussed the procedure including the risks, benefits and alternatives for the proposed anesthesia with the patient or authorized representative who has indicated his/her understanding and acceptance.   Dental advisory given  Plan Discussed with: CRNA and Surgeon  Anesthesia Plan Comments:         Anesthesia Quick Evaluation

## 2018-07-24 NOTE — Op Note (Signed)
NAME: [name], HAM[facility] MEDICAL RECORD CV:[id number] ACCOUNT [id number] DATE OF BIRTH:[birth year] FACILITY: [facility] LOCATION: [facility] PHYSICIAN:Callin Ashe Gwinda Passe, MD  OPERATIVE REPORT  DATE OF PROCEDURE:  07/24/2018  PREOPERATIVE DIAGNOSES: 1.  Left knee torn anterior cruciate ligament graft. 2.  Possible torn medial and lateral meniscus. 3.  Retained hardware, deep.  POSTOPERATIVE DIAGNOSES:   1.  Left knee torn anterior cruciate ligament graft.   2.  Complex tear posterior horn of the lateral meniscus.   3.  Complex tear posterior horn medial meniscus. 4.  Retained hardware, deep, left femur.  PROCEDURES: 1.  Left knee arthroscopic assisted allograft anterior cruciate ligament REVISION reconstruction. 2.  Partial medial meniscectomy. 3.  Partial lateral meniscectomy. 4.  Removal of hardware, deep femur, screw. 5.  Implantation of allograft.   SURGEON:  Hart Robinsons, M.D.  ASSISTANT:  Verlin Fester Stilwell PA-C.  ANESTHESIA:  Adductor canal block with general.  ESTIMATED BLOOD LOSS:  Minimal.  DRAINS:  None.  COMPLICATIONS:  None.  TOURNIQUET TIME:  1 hour and 50 minutes at 300 mmHg.  DISPOSITION:  PACU stable.  DESCRIPTION OF PROCEDURE:  The patient's family counseled in holding area, correct site was identified, marked and signed appropriately.  IV started.  Sedation was given.  The block was administered.  IV antibiotics were given.  He was taken to the  operating room and placed in supine position.  General anesthesia was obtained well-padded bump.   left lower extremity elevated.  She was prepped with DuraPrep and draped in a sterile fashion.  Time-out done, confirming the left side.  I exsanguinated  with Esmarch, tourniquet inflated to 300 mmHg.  Arthroscopic portals were established proximal inferomedial, inferolateral.  Medial arthroscopy revealed a ruptured ACL graft.  At this point in time, a size appropriate Arthrex LifeNet allograft was  chosen, prepared on  the back table by  Wyatt Portela, PA-C following ACL reconstruction.  The ACL fibers were debrided.  The tendon was found to be anterior to the femur.  A notchplasty was performed.  Patellofemoral joint revealed normal articular cartilage and normal tracking.   Suprapatellar pouch, medial gutter was unremarkable.  Lateral side was inspected.  There was a complex tear posterior horn of the lateral meniscus with shaver and basket.  Motorized shaver cautery system a partial lateral meniscectomy was performed back  to stable base.  Articular cartilage appeared to be healthy.  On the medial side again, articular cartilage was healthy, which was a complex tear of the posterior horn of the medial meniscus and a basket motorized shaver cautery system and partial medial  meniscectomy was performed back to stable base, appropriate bevel and contour.  The femoral guide was inserted the over-the-top position.  A small stab wound was made distal lateral to the femur.  An Arthrex FlipCutter was put in an anatomic ACL  footprint being posterior to the previous socket.  At this point in time,  upon engaging the FlipCutter, it was found that the tip was engaged what appeared to be possibly the screw from the previous surgery.  At this point in time, it was fragmented and  it was removed in its entirety with the patient has been accounted for.  At this time, we then identified the screw.  Soft tissue was removed and we removed the screw meticulously, as we did the tips of the FlipCutter.  Following this, another  FlipCutter was placed and a retrosocket was performed.  FiberWire suture passer was placed and debris was removed.  From  the tibial side, we made a small incision anteromedial with the guide in the anatomic ACL footprint and a retrosocket was performed.   FiberWire suture passer and placed on both sides and debris was removed from both sides and there was no engagement of the screw on the tibial side.  AccuFill  from Arthrex was placed in the tunnel.  The graft was then delivered into the femoral socket  in a retrograde fashion.  A small incision was made distal lateral and the extender button was placed in the lateral femoral cortex and confirmed arthroscopically palpable and visual.  The graft was then delivered into the femoral socket in a retrograde  fashion into the tibial socket in an antegrade fashion.  With 30 degrees of flexion, the graft was tensioned on both sides.  It was securely fixed the tibial side with an Arthrex button placed from anteromedial tibial cortex and we confirmed excellent  position and location of the graft and well tensioned and then placed in full extension and the internal brace was placed into a SwiveLock.  It was put through range of motion.  The graft had excellent orientation and tension.  The knee was stable.  The  graft was well fixed in both sides.  The graft had excellent orientation and tension, we were very pleased.  It was irrigated and arthroscopic equipment was removed.  All excess sutures were removed.  Portals were closed with nylon suture, 2 small  incisions with subQ Vicryl, skin with a subcuticular Monocryl suture.  Ten mL of Sensorcaine placed in the skin.  Ten mL Sensorcaine placed in the joint with  4 mg morphine sulfate and that confirmed anesthesia.  Sterile dressing applied to the knee and  the tourniquet deflated.  She had normal circulation at the end of the case.  She was placed into a compressive wrap immobilizer in extension and an ice pack.  There are no complicating features.  She tolerated the procedure well and she is awakened and  taken from the operating room to the PACU in stable condition.    She was stabilized in PACU and discharged home.  Help with patient positioning, prepping, draping, technical and surgical assistant throughout the entire case.  Wound closure, application of dressing and splint, graft preparation and surgical assistance  throughout the entire case.  Mr. Wyatt Portela  PA-C's assistance was needed.    She will be seen in the office next week.  On Monday she will start physical therapy.  All questions were encouraged and answered with the family postoperatively and will be encouraged and answered with the patient.  AN/NUANCE  D:07/24/2018 T:07/24/2018 JOB:[id number]

## 2018-07-24 NOTE — Anesthesia Procedure Notes (Signed)
Procedure Name: LMA Insertion Date/Time: 07/24/2018 1:11 PM Performed by: Suan Halter, CRNA Pre-anesthesia Checklist: Patient identified, Emergency Drugs available, Suction available and Patient being monitored Patient Re-evaluated:Patient Re-evaluated prior to induction Oxygen Delivery Method: Circle system utilized Preoxygenation: Pre-oxygenation with 100% oxygen Induction Type: IV induction Ventilation: Mask ventilation without difficulty LMA: LMA inserted LMA Size: 4.0 Number of attempts: 1 Airway Equipment and Method: Bite block Placement Confirmation: positive ETCO2 Tube secured with: Tape Dental Injury: Teeth and Oropharynx as per pre-operative assessment

## 2018-07-24 NOTE — Anesthesia Postprocedure Evaluation (Signed)
Anesthesia Post Note  Patient: [name]  Procedure(s) Performed: Left knee arthroscopy, debridement, allograft revision anterior cruciate ligament reconstruction,  hardware removal deep, partialmedial and lateral  menisectomy (Left Knee)     Patient location during evaluation: PACU Anesthesia Type: General Level of consciousness: awake and alert and oriented Pain management: pain level controlled Vital Signs Assessment: post-procedure vital signs reviewed and stable Respiratory status: spontaneous breathing, nonlabored ventilation and respiratory function stable Cardiovascular status: blood pressure returned to baseline and stable Postop Assessment: no apparent nausea or vomiting Anesthetic complications: no    Last Vitals:  Vitals:   07/24/18 1615 07/24/18 1630  BP: 132/85 134/82  Pulse: 87 82  Resp: 15 12  Temp:    SpO2: 98% 98%    Last Pain:  Vitals:   07/24/18 1630  TempSrc:   PainSc: 7                  Melvie Paglia A.

## 2018-07-24 NOTE — Discharge Instructions (Signed)

## 2018-07-24 NOTE — Interval H&P Note (Signed)
History and Physical Interval Note:  07/24/2018 12:06 PM  [name]  has presented today for surgery, with the diagnosis of Left knee torn anterior cruciate ligament, meniscal tear  The various methods of treatment have been discussed with the patient and family. After consideration of risks, benefits and other options for treatment, the patient has consented to  Procedure(s) with comments: Left knee arthroscopy, debridement, allograft revision anterior cruciate ligament reconstruction, possible hardware removal, partial menisectomy versus repair (Left) - 2 hrs General with adductor canal block as a surgical intervention .  The patient's history has been reviewed, patient examined, no change in status, stable for surgery.  I have reviewed the patient's chart and labs.  Questions were answered to the patient's satisfaction.     [name] ANDREW

## 2018-07-24 NOTE — Transfer of Care (Signed)
Immediate Anesthesia Transfer of Care Note  Patient: [name]  Procedure(s) Performed: Procedure(s) (LRB): Left knee arthroscopy, debridement, allograft revision anterior cruciate ligament reconstruction,  hardware removal deep, partialmedial and lateral  menisectomy (Left)  Patient Location: PACU  Anesthesia Type: General  Level of Consciousness: awake, oriented, sedated and patient cooperative  Airway & Oxygen Therapy: Patient Spontanous Breathing and Patient connected to face mask oxygen  Post-op Assessment: Report given to PACU RN and Post -op Vital signs reviewed and stable  Post vital signs: Reviewed and stable  Complications: No apparent anesthesia complications  Last Vitals:  Vitals Value Taken Time  BP 132/91 07/24/2018  3:46 PM  Temp    Pulse 95 07/24/2018  3:55 PM  Resp 18 07/24/2018  3:55 PM  SpO2 100 % 07/24/2018  3:55 PM  Vitals shown include unvalidated device data.  Last Pain:  Vitals:   07/24/18 1537  TempSrc:   PainSc: 0-No pain      Patients Stated Pain Goal: 7 (07/24/18 1142)

## 2018-07-24 NOTE — H&P (Signed)
[name] is an 47 y.o. female.   Chief Complaint: left knee instability HPI: Patient presents with joint discomfort that had been persistent for several weeks now. Despite conservative treatments, the patients discomfort has not improved. Imaging was obtained. Other conservative and surgical treatments were discussed in detail. Patient wishes to proceed with surgery as consented. Denies SOB, CP, or calf pain. No Fever, chills, or nausea/ vomiting.   Past Medical History:  Diagnosis Date  . Depression   . 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 History of anemia   . 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 History of hypertension 2004   PIH  . History of hypothyroidism    per pt yrs ago -- resolved  . History of MI (myocardial infarction) 02-05-2003----  resolved   in setting [place] during pregnancy--- SVD with amniotic fluid embolism, DIC, respiratory failure with pulmonary edema (pt was venterd)  . Irritable bowel syndrome     Past Surgical History:  Procedure Laterality Date  . ANTERIOR CRUCIATE LIGAMENT REPAIR Left 04-20-2003    dr Rhona Raider   [facility]  . CARDIAC CATHETERIZATION  07-01-2003   dr Terrence Dupont    [facility]   positive stress cardiolite:  normal coronaries,  LV w/ mild global hypokinesis, ef 50%  . CURRETTAGE FOR RETAINED PRODUCTS OF CONCEPTION  11-25-2007      @[facility]    FOR PORTPARTUM HEMORRHAGE  . HYSTEROSCOPY WITH NOVASURE N/A 11/27/2013   Procedure: HYSTEROSCOPY WITH NOVASURE;  Surgeon: Lavonia Drafts, MD;  Location: [facility] [facility];  Service: Gynecology;  Laterality: N/A;  . POST PARTUM TUBAL LIGATION WITH FILSHIE CLIPS  11-17-2002     dr leggett  [place]  . TRANSTHORACIC ECHOCARDIOGRAM  05/09/2010   ef 55-60%/  trivial MR and TR/  trivial pericardial effusion  . ULNAR SHORTENING WITH BONE GRAFT Right 03/28/2017   Procedure: RIGHT ULNA WRIST OSTEOTOMY;  Surgeon: Daryll Brod,  MD;  Location: [place];  Service: Orthopedics;  Laterality: Right;  . WRIST ARTHROSCOPY WITH FOVEAL TRIANGULAR FIBROCARTILAGE COMPLEX REPAIR Right 09/25/2016   Procedure: Right WRIST ARTHROSCOPY WITH FOVEAL TRIANGULAR FIBROCARTILAGE COMPLEX REPAIR;  Surgeon: Daryll Brod, MD;  Location: [place];  Service: Orthopedics;  Laterality: Right;    Family History  Problem Relation Age of Onset  . Arthritis Father   . Hypertension Father   . Depression Brother   . Allergies Son   . Prostate cancer Paternal Uncle   . Stomach cancer Paternal Grandmother    Social History:  reports that she quit smoking about 21 years ago. Her smoking use included cigarettes. She quit after 0.50 years of use. She has never used smokeless tobacco. She reports that she drinks alcohol. She reports that she has current or past drug history. Drug: Marijuana.  Allergies: No Known Allergies  Medications Prior to Admission  Medication Sig Dispense Refill  . FLUoxetine (PROZAC) 40 MG capsule Take 1 capsule (40 mg total) by mouth daily. (Patient taking differently: Take 40 mg by mouth every morning. ) 30 capsule 11  . fluticasone (FLONASE) 50 MCG/ACT nasal spray Place 2 sprays into both nostrils daily. (Patient taking differently: Place 2 sprays into both nostrils every morning. ) 16 g 11  . Multiple Vitamin (MULTIVITAMIN WITH MINERALS) TABS Take 1 tablet by mouth daily.    . traZODone (DESYREL) 50 MG tablet Take 1 tablet (50 mg total) by mouth at  bedtime. 30 tablet 11    No results found for this or any previous visit (from the past 48 hour(s)). No results found.  Review of Systems  Constitutional: Negative.   HENT: Negative.   Eyes: Negative.   Respiratory: Negative.   Cardiovascular: Negative.   Gastrointestinal: Negative.   Genitourinary: Negative.   Musculoskeletal: Positive for joint pain.  Skin: Negative.   Neurological: Negative.   Endo/Heme/Allergies: Negative.    Psychiatric/Behavioral: Negative.     Blood pressure (!) 145/88, pulse 76, temperature 98.5 F (36.9 C), temperature source Oral, resp. rate 16, height 5\' 2"  (1.575 m), weight 77 kg, last menstrual period 07/06/2018, SpO2 100 %. Physical Exam  Constitutional: She is oriented to person, place, and time. She appears well-developed.  HENT:  Head: Normocephalic.  Eyes: EOM are normal.  Neck: Normal range of motion.  Cardiovascular: Normal rate and intact distal pulses.  Respiratory: Effort normal.  GI: Soft.  Genitourinary:  Genitourinary Comments: Deferred  Musculoskeletal:  Left knee positive Lachmans. Good strength. Good ROM. Calf is soft.   Neurological: She is alert and oriented to person, place, and time.  Skin: Skin is warm and dry.  Psychiatric: Her behavior is normal.     Assessment/Plan Deficient ACL graft: Allograft ACL Revision as consented D/c home with family Follow instructions ASA at D/c F/u in office   Lajean Manes, PA-C 07/24/2018, 11:54 AM

## 2018-07-24 NOTE — Op Note (Signed)
Dictated#[id number]

## 2018-07-29 ENCOUNTER — Encounter (HOSPITAL_BASED_OUTPATIENT_CLINIC_OR_DEPARTMENT_OTHER): Payer: Self-pay | Admitting: Specialist

## 2018-08-10 HISTORY — PX: OTHER SURGICAL HISTORY: SHX169

## 2018-08-14 ENCOUNTER — Encounter (HOSPITAL_BASED_OUTPATIENT_CLINIC_OR_DEPARTMENT_OTHER): Payer: Self-pay | Admitting: Specialist

## 2018-09-02 ENCOUNTER — Encounter: Payer: Self-pay | Admitting: Internal Medicine

## 2018-09-02 ENCOUNTER — Ambulatory Visit: Payer: Medicaid Other | Admitting: Internal Medicine

## 2018-09-02 VITALS — BP 128/88 | HR 80 | Resp 12 | Ht 61.0 in | Wt 165.0 lb

## 2018-09-02 DIAGNOSIS — N760 Acute vaginitis: Secondary | ICD-10-CM

## 2018-09-02 LAB — POCT WET PREP WITH KOH
CLUE CELLS WET PREP PER HPF POC: NEGATIVE
KOH Prep POC: NEGATIVE
RBC Wet Prep HPF POC: NEGATIVE
Trichomonas, UA: NEGATIVE
Yeast Wet Prep HPF POC: POSITIVE

## 2018-09-02 MED ORDER — FLUCONAZOLE 150 MG PO TABS: ORAL_TABLET | ORAL | 0 refills | Status: DC

## 2018-09-02 NOTE — Progress Notes (Signed)
   Subjective:    Patient ID: [name], female    DOB: 09-03-1971, 47 y.o.   MRN: [id number]  HPI   Having vaginal discharge and odor for 1 month.  States discharge is clear.   Itching as well.  Used monistat vaginal cream 7 day cream ending about 1 week ago. Improved for a couple of days before recurred.  She has not been sexually active since beginning of July.   She did have orthopedic knee surgery in August.  Likely had intraoperative antibiotics.   The surgery was outpatient and she did not have antibiotics subsequently.   Current Meds  Medication Sig  . aspirin EC 325 MG tablet Take 1 tablet (325 mg total) by mouth 2 (two) times daily.  Marland Kitchen FLUoxetine (PROZAC) 40 MG capsule Take 1 capsule (40 mg total) by mouth daily. (Patient taking differently: Take 40 mg by mouth every morning. )  . fluticasone (FLONASE) 50 MCG/ACT nasal spray Place 2 sprays into both nostrils daily. (Patient taking differently: Place 2 sprays into both nostrils every morning. )  . methocarbamol (ROBAXIN) 500 MG tablet Take 1 tablet (500 mg total) by mouth every 8 (eight) hours as needed for muscle spasms.  . Multiple Vitamin (MULTIVITAMIN WITH MINERALS) TABS Take 1 tablet by mouth daily.  Marland Kitchen oxyCODONE (OXY IR/ROXICODONE) 5 MG immediate release tablet TAKE 1 TABLET BY MOUTH EVERY 6 HOURS FOR 7 DAYS  . traZODone (DESYREL) 50 MG tablet Take 1 tablet (50 mg total) by mouth at bedtime.    No Known Allergies   Review of Systems     Objective:   Physical Exam NAD Abd:  S, NT, No HSM or mass, + BS GU:  Scant white discharge noted externally. No significant inflammation of internal vaginal mucosa or cervix.  No lesion.  Thin white discharge without odor.  Wet prep:  + scant budding yeast and pseudohyphae.  Neg clue and whiff.  Negative trichomonas. Few WBCs no RBCs       Assessment & Plan:  Yeast vaginitis:  Fluconazole 150 mg daily for 3 days.   Checking GC/chlamydia as well. Call if does not  resolve.

## 2018-09-04 LAB — GC/CHLAMYDIA PROBE AMP
Chlamydia trachomatis, NAA: NEGATIVE
NEISSERIA GONORRHOEAE BY PCR: NEGATIVE

## 2018-09-12 ENCOUNTER — Other Ambulatory Visit (INDEPENDENT_AMBULATORY_CARE_PROVIDER_SITE_OTHER): Payer: Medicaid Other

## 2018-09-12 DIAGNOSIS — N898 Other specified noninflammatory disorders of vagina: Secondary | ICD-10-CM | POA: Diagnosis not present

## 2018-09-12 LAB — POCT WET PREP WITH KOH
KOH Prep POC: NEGATIVE
TRICHOMONAS UA: NEGATIVE
Yeast Wet Prep HPF POC: NEGATIVE

## 2018-09-12 MED ORDER — METRONIDAZOLE 500 MG PO TABS: ORAL_TABLET | ORAL | 0 refills | Status: DC

## 2018-09-12 NOTE — Progress Notes (Signed)
Apparent BV.  Rx to be sent

## 2018-09-23 ENCOUNTER — Telehealth: Payer: Self-pay

## 2018-09-23 MED ORDER — METRONIDAZOLE 0.75 % VA GEL: VAGINAL | 6 refills | Status: DC

## 2018-09-23 NOTE — Telephone Encounter (Signed)
Spoke with patient. States she had been on oral metronidazole for BV and once she stopped her symptoms came right back. States she would prefer to be on the metronidazole gel. States it works better for her.  Per Dr. Amil Amen she can do the gel and stay on it long term for 6 months. States she will use it for 5 days then go to twice a week for the next 6 months.

## 2018-09-23 NOTE — Telephone Encounter (Signed)
Spoke with patient. States she is fine with long term use of Metronidazole gel. Patient would like Rx called into [facility] [place].  To Dr. Amil Amen to send in Rx.

## 2018-09-23 NOTE — Telephone Encounter (Signed)
Left message for patient to call back  

## 2018-09-24 NOTE — Telephone Encounter (Signed)
Patient informed. 

## 2018-10-28 ENCOUNTER — Encounter: Payer: Self-pay | Admitting: Internal Medicine

## 2018-10-28 ENCOUNTER — Ambulatory Visit: Payer: Medicaid Other | Admitting: Internal Medicine

## 2018-10-28 VITALS — BP 124/88 | HR 60 | Resp 12 | Ht 61.0 in | Wt 170.0 lb

## 2018-10-28 DIAGNOSIS — J019 Acute sinusitis, unspecified: Secondary | ICD-10-CM | POA: Diagnosis not present

## 2018-10-28 DIAGNOSIS — J3089 Other allergic rhinitis: Secondary | ICD-10-CM

## 2018-10-28 MED ORDER — FLUTICASONE PROPIONATE 50 MCG/ACT NA SUSP: 2.0000 | Freq: Every day | NASAL | 11 refills | Status: DC

## 2018-10-28 MED ORDER — AZITHROMYCIN 500 MG PO TABS: ORAL_TABLET | ORAL | 0 refills | Status: DC

## 2018-10-28 NOTE — Patient Instructions (Addendum)
Saline nasal spray before nasal fluticasone.  Can use later in day as long as about 2 hours after use the nasal fluticasone  Drink lots of water--avoid sugary drinks, including juice with diarrhea  Cool mist humidifier.

## 2018-10-28 NOTE — Progress Notes (Signed)
   Subjective:    Patient ID: [name], female    DOB: [birth year], 47 y.o.   MRN: [id number]  HPI   Started with cough about 2 weeks ago.  Developed sore throat, hoarseness, headache, diarrhea and bodyaches.  Felt chilled, but no definite fever.   Still having most of the symptoms, though the hoarseness and diarrhea are improving.   Having a lot of sinus pressure.   Did get the flu shot 5 days ago.   Drinking fluids well. Water and OJ.   Eating somewhat bland diet.  Not much of appetite. Was taking Nyquil, but did not seem to work for her.  Not really using anything else.  Current Meds  Medication Sig  . FLUoxetine (PROZAC) 40 MG capsule Take 1 capsule (40 mg total) by mouth daily.  Marland Kitchen HYDROcodone-acetaminophen (NORCO/VICODIN) 5-325 MG tablet hydrocodone 5 mg-acetaminophen 325 mg tablet  TAKE 1 TABLET BY MOUTH EVERY 6 HOURS  . methocarbamol (ROBAXIN) 500 MG tablet Take 1 tablet (500 mg total) by mouth every 8 (eight) hours as needed for muscle spasms.  . metroNIDAZOLE (METROGEL) 0.75 % vaginal gel 1 applicatorful vaginally twice daily for 5 days, then apply twice weekly for 6 months.  . Multiple Vitamin (MULTIVITAMIN WITH MINERALS) TABS Take 1 tablet by mouth daily.  . traZODone (DESYREL) 50 MG tablet Take 1 tablet (50 mg total) by mouth at bedtime.    No Known Allergies Review of Systems     Objective:   Physical Exam Mildly ill appearing.   HEENT: PERRL, EOMI, TMs pearly gray.  Nasal mucosa swollen and red with white discharge.  Posterior pharynx with mild injection.  No exudate.  No definite tenderness over frontal and maxillary sinus areas. Neck:  Supple, No adenopathy Chest:  CTA CV:  RRR without murmur or rub.  RAdial pulses normal and equal Abd:  S, NT, No HSM or mass, +BS Skin:  No rash Wearing Left knee brace.       Assessment & Plan:  1.  Acute sinusitis:  Azithromycin 500 mg daily for 5 days.  Nasal saline and fluticasone.   Cool mist humidifier in  bedroom Call if no improvement in next 2-3 days.

## 2018-11-04 ENCOUNTER — Ambulatory Visit (HOSPITAL_COMMUNITY)
Admission: RE | Admit: 2018-11-04 | Discharge: 2018-11-04 | Disposition: A | Discharge status: Home / Self Care | Payer: Medicaid Other | Source: Ambulatory Visit | Attending: Internal Medicine | Admitting: Internal Medicine

## 2018-11-04 ENCOUNTER — Encounter (HOSPITAL_COMMUNITY): Payer: Self-pay

## 2018-11-04 ENCOUNTER — Other Ambulatory Visit: Payer: Self-pay | Admitting: Internal Medicine

## 2018-11-04 ENCOUNTER — Telehealth: Payer: Self-pay | Admitting: Internal Medicine

## 2018-11-04 ENCOUNTER — Other Ambulatory Visit (HOSPITAL_COMMUNITY): Payer: Self-pay | Admitting: Specialist

## 2018-11-04 DIAGNOSIS — R52 Pain, unspecified: Secondary | ICD-10-CM

## 2018-11-04 DIAGNOSIS — R609 Edema, unspecified: Secondary | ICD-10-CM | POA: Diagnosis not present

## 2018-11-04 NOTE — Progress Notes (Signed)
Today's left lower extremity venous duplex is negative for DVT. Preliminary results given to [facility] at 2:45.

## 2018-11-04 NOTE — Telephone Encounter (Signed)
Patient called and is still having cough and nausea and sinus pressure.  A great deal of nausea and not much wanting to eat.  Was told to call Dr. Amil Amen if not feeling better.  Patient contact number [phone].

## 2018-11-05 NOTE — Telephone Encounter (Signed)
Called all the numbers listed for patient and emergency numbers.  2 of them seem to be related to shops and not personal phone numbers as listed. Called her phone again, but only going to voicemail.  Left another message to call.

## 2018-11-05 NOTE — Telephone Encounter (Signed)
Called twice this morning to try and speak with patient.  Have left messages to call clinic back now after 2 p.m. When we reopen following lunch.

## 2018-12-12 NOTE — Telephone Encounter (Signed)
Error

## 2018-12-31 ENCOUNTER — Other Ambulatory Visit: Payer: Self-pay | Admitting: Internal Medicine

## 2019-01-09 ENCOUNTER — Other Ambulatory Visit: Payer: Self-pay

## 2019-01-09 MED ORDER — FLUOXETINE HCL 40 MG PO CAPS: 40.0000 mg | ORAL_CAPSULE | Freq: Every day | ORAL | 11 refills | Status: DC

## 2019-01-19 ENCOUNTER — Ambulatory Visit: Payer: Self-pay | Admitting: Internal Medicine

## 2019-04-14 ENCOUNTER — Encounter: Payer: Self-pay | Admitting: Internal Medicine

## 2019-09-10 ENCOUNTER — Other Ambulatory Visit: Payer: Self-pay

## 2019-09-10 ENCOUNTER — Telehealth (HOSPITAL_COMMUNITY): Payer: Self-pay | Admitting: Emergency Medicine

## 2019-09-10 ENCOUNTER — Encounter (HOSPITAL_COMMUNITY): Payer: Self-pay

## 2019-09-10 ENCOUNTER — Ambulatory Visit (HOSPITAL_COMMUNITY)
Admission: EM | Admit: 2019-09-10 | Discharge: 2019-09-10 | Disposition: A | Discharge status: Home / Self Care | Payer: Medicaid Other | Attending: Family Medicine | Admitting: Family Medicine

## 2019-09-10 DIAGNOSIS — N898 Other specified noninflammatory disorders of vagina: Secondary | ICD-10-CM | POA: Insufficient documentation

## 2019-09-10 LAB — POCT URINALYSIS DIP (DEVICE)
Bilirubin Urine: NEGATIVE
Glucose, UA: NEGATIVE mg/dL
Ketones, ur: NEGATIVE mg/dL
Leukocytes,Ua: NEGATIVE
Nitrite: NEGATIVE
Protein, ur: NEGATIVE mg/dL
Specific Gravity, Urine: >=1.03 (ref 1.005–1.030)
Urobilinogen, UA: 0.2 mg/dL (ref 0.0–1.0)
pH: 6 (ref 5.0–8.0)

## 2019-09-10 MED ORDER — METRONIDAZOLE 500 MG PO TABS: 500.0000 mg | ORAL_TABLET | Freq: Two times a day (BID) | ORAL | 0 refills | Status: DC

## 2019-09-10 MED ORDER — FLUCONAZOLE 150 MG PO TABS: 150.0000 mg | ORAL_TABLET | Freq: Once | ORAL | 0 refills | Status: AC

## 2019-09-10 NOTE — ED Provider Notes (Signed)
[place]    CSN: [id number] Arrival date & time: 09/10/19  [id number]      History   Chief Complaint Chief Complaint  Patient presents with  . Urinary Tract Infection    HPI TERRE LESAR is a 48 y.o. female.   Patient is a 48 year old female past medical history of depression, anemia, DIC, hypertension, hypothyroidism, MI, IBS.  She presents today with approximately 1 to 2 weeks of dysuria, lower abdominal cramping, vaginal discharge.  Symptoms have been constant and worsening.  She describes the discharge as thin/watery with odor.  Denies any urinary frequency or hematuria.  She has increased her water intake to help with her symptoms.  Denies any pelvic pain, back pain, fevers, nausea or vomiting.  Would like to be checked for STDs.  ROS per HPI    Urinary Tract Infection   Past Medical History:  Diagnosis Date  . Depression   . 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 History of anemia   . 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 History of hypertension 2004   PIH  . History of hypothyroidism    per pt yrs ago -- resolved  . History of MI (myocardial infarction) 02-05-2003----  resolved   in setting [place] during pregnancy--- SVD with amniotic fluid embolism, DIC, respiratory failure with pulmonary edema (pt was venterd)  . Irritable bowel syndrome     Patient Active Problem List   Diagnosis Date Noted  . S/P ACL reconstruction 07/24/2018  . Environmental and seasonal allergies 03/12/2017  . Insomnia 12/11/2016  . Pulmonary embolism complicating pregnancy 0000000  . HYPOTHYROIDISM 07/30/2007  . Depression 07/30/2007  . MYOCARDIAL INFARCTION, HX OF 07/30/2007  . EMBOLISM, AMNIOTIC FLUID, DELIVERED W/PP 07/30/2007  . HX, PERSONAL, SUDDEN CARDIAC ARRREST 07/30/2007    Past Surgical History:  Procedure Laterality Date  . ANTERIOR CRUCIATE  LIGAMENT (ACL) REVISION Left 07/24/2018   Procedure: Left knee arthroscopy, debridement, allograft revision anterior cruciate ligament reconstruction,  hardware removal deep, partialmedial and lateral  menisectomy;  Surgeon: Sydnee Cabal, MD;  Location: [facility];  Service: Orthopedics;  Laterality: Left;  2 hrs General with adductor canal block  . ANTERIOR CRUCIATE LIGAMENT REPAIR Left 04-20-2003    dr Rhona Raider   [facility]  . CARDIAC CATHETERIZATION  07-01-2003   dr Terrence Dupont    [facility]   positive stress cardiolite:  normal coronaries,  LV w/ mild global hypokinesis, ef 50%  . CURRETTAGE FOR RETAINED PRODUCTS OF CONCEPTION  11-25-2007      @[facility]    FOR PORTPARTUM HEMORRHAGE  . HYSTEROSCOPY WITH NOVASURE N/A 11/27/2013   Procedure: HYSTEROSCOPY WITH NOVASURE;  Surgeon: Lavonia Drafts, MD;  Location: [facility];  Service: Gynecology;  Laterality: N/A;  . Left ACL reconstruction Left 08/2018   MCL sprain as well.  Marland Kitchen POST PARTUM TUBAL LIGATION WITH FILSHIE CLIPS  11-17-2002     dr leggett  [place]  . TRANSTHORACIC ECHOCARDIOGRAM  05/09/2010   ef 55-60%/  trivial MR and TR/  trivial pericardial effusion  . ULNAR SHORTENING WITH BONE GRAFT Right 03/28/2017   Procedure: RIGHT ULNA WRIST OSTEOTOMY;  Surgeon: Daryll Brod, MD;  Location: [place];  Service: Orthopedics;  Laterality: Right;  . WRIST ARTHROSCOPY WITH FOVEAL TRIANGULAR FIBROCARTILAGE COMPLEX REPAIR Right 09/25/2016   Procedure: Right WRIST ARTHROSCOPY WITH FOVEAL TRIANGULAR FIBROCARTILAGE COMPLEX REPAIR;  Surgeon: Daryll Brod, MD;  Location: [place];  Service: Orthopedics;  Laterality: Right;    OB History    Gravida  6   Para  5   Term  5   Preterm  0   AB  1   Living  5     SAB  1   TAB  0   Ectopic  0   Multiple  0   Live Births               Home Medications    Prior to Admission medications   Medication Sig Start Date End Date Taking? Authorizing Provider   FLUoxetine (PROZAC) 40 MG capsule Take 1 capsule (40 mg total) by mouth daily. 01/09/19  Yes Mack Hook, MD  metoprolol succinate (TOPROL-XL) 25 MG 24 hr tablet Take 25 mg by mouth daily.   Yes [provider]  traZODone (DESYREL) 50 MG tablet TAKE 1 TABLET BY MOUTH AT BEDTIME 12/31/18  Yes Mack Hook, MD  azithromycin (ZITHROMAX) 500 MG tablet 1 tab by mouth once daily for 5 days. 10/28/18   Mack Hook, MD  fluticasone (FLONASE) 50 MCG/ACT nasal spray Place 2 sprays into both nostrils daily. 10/28/18   Mack Hook, MD  HYDROcodone-acetaminophen (NORCO/VICODIN) 5-325 MG tablet hydrocodone 5 mg-acetaminophen 325 mg tablet  TAKE 1 TABLET BY MOUTH EVERY 6 HOURS    [provider]  methocarbamol (ROBAXIN) 500 MG tablet Take 1 tablet (500 mg total) by mouth every 8 (eight) hours as needed for muscle spasms. 07/24/18   Stilwell, Bryson L, PA-C  metroNIDAZOLE (FLAGYL) 500 MG tablet Take 1 tablet (500 mg total) by mouth 2 (two) times daily. 09/10/19   Deryn Massengale, Tressia Miners A, NP  metroNIDAZOLE (METROGEL) 0.75 % vaginal gel 1 applicatorful vaginally twice daily for 5 days, then apply twice weekly for 6 months. 09/23/18   Mack Hook, MD  Multiple Vitamin (MULTIVITAMIN WITH MINERALS) TABS Take 1 tablet by mouth daily.    [provider]    Family History Family History  Problem Relation Age of Onset  . Arthritis Father   . Hypertension Father   . Depression Brother   . Allergies Son   . Prostate cancer Paternal Uncle   . Stomach cancer Paternal Grandmother     Social History Social History   Tobacco Use  . Smoking status: Former Smoker    Years: 0.50    Types: Cigarettes    Quit date: 07/14/1997    Years since quitting: 22.1  . Smokeless tobacco: Never Used  Substance Use Topics  . Alcohol use: Yes    Alcohol/week: 0.0 standard drinks    Comment: occasional  . Drug use: Not Currently    Types: Marijuana    Comment: per pt last used  [place]   Patient has no known allergies.   Review of Systems Review of Systems   Physical Exam Triage Vital Signs ED Triage Vitals  Enc Vitals Group     BP 09/10/19 0851 (!) 145/85     Pulse Rate 09/10/19 0851 75     Resp 09/10/19 0851 16     Temp 09/10/19 0851 98.5 F (36.9 C)     Temp Source 09/10/19 0851 Oral     SpO2 09/10/19 0851 99 %     Weight --      Height --      Head Circumference --      Peak Flow --      Pain Score  09/10/19 0847 6     Pain Loc --      Pain Edu? --      Excl. in [place]? --    No data found.  Updated Vital Signs BP (!) 145/85 (BP Location: Left Arm)   Pulse 75   Temp 98.5 F (36.9 C) (Oral)   Resp 16   SpO2 99%   Visual Acuity Right Eye Distance:   Left Eye Distance:   Bilateral Distance:    Right Eye Near:   Left Eye Near:    Bilateral Near:     Physical Exam Vitals signs and nursing note reviewed.  Constitutional:      General: She is not in acute distress.    Appearance: Normal appearance. She is not ill-appearing, toxic-appearing or diaphoretic.  HENT:     Head: Normocephalic.     Nose: Nose normal.     Mouth/Throat:     Pharynx: Oropharynx is clear.  Eyes:     Conjunctiva/sclera: Conjunctivae normal.  Neck:     Musculoskeletal: Normal range of motion.  Pulmonary:     Effort: Pulmonary effort is normal.  Abdominal:     Palpations: Abdomen is soft.     Tenderness: There is no abdominal tenderness.  Musculoskeletal: Normal range of motion.  Skin:    General: Skin is warm and dry.     Findings: No rash.  Neurological:     Mental Status: She is alert.  Psychiatric:        Mood and Affect: Mood normal.      UC Treatments / Results  Labs (all labs ordered are listed, but only abnormal results are displayed) Labs Reviewed  POCT URINALYSIS DIP (DEVICE) - Abnormal; Notable for the following components:      Result Value   Hgb urine dipstick MODERATE (*)    All other components within  normal limits  URINE CULTURE  CERVICOVAGINAL ANCILLARY ONLY    EKG   Radiology No results found.  Procedures Procedures (including critical care time)  Medications Ordered in UC Medications - No data to display  Initial Impression / Assessment and Plan / UC Course  I have reviewed the triage vital signs and the nursing notes.  Pertinent labs & imaging results that were available during my care of the patient were reviewed by me and considered in my medical decision making (see chart for details).     Vaginal discharge-urine was negative for infection.  Most likely based on symptoms and history this is been a bacterial infection.  We will treat for BV.Swab pending.  Final Clinical Impressions(s) / UC Diagnoses   Final diagnoses:  Vaginal discharge     Discharge Instructions     Treating you for bacterial vaginosis today.  We will send your urine for culture and swab for culture We will call you with any positive results Follow up as needed for continued or worsening symptoms     ED Prescriptions    Medication Sig Dispense Auth. Provider   metroNIDAZOLE (FLAGYL) 500 MG tablet Take 1 tablet (500 mg total) by mouth 2 (two) times daily. 14 tablet Kaige Whistler A, NP     PDMP not reviewed this encounter.   Loura Halt A, NP 09/10/19 1328

## 2019-09-10 NOTE — Discharge Instructions (Addendum)
Treating you for bacterial vaginosis today.  We will send your urine for culture and swab for culture We will call you with any positive results Follow up as needed for continued or worsening symptoms

## 2019-09-10 NOTE — ED Triage Notes (Signed)
Patient presents to Urgent Care with complaints of UTI symptoms including burning with urination and lower abdominal cramping since 1-2 weeks ago. Patient reports she has been drinking a lot of water recently and that has helped but the symptoms persist.

## 2019-09-10 NOTE — Telephone Encounter (Signed)
Pt called saying the flagyl made her itch and requests medication for yeast. Okay to send per Dr. Mannie Stabile.

## 2019-09-11 LAB — URINE CULTURE: Culture: 20000 — AB

## 2019-09-16 LAB — CERVICOVAGINAL ANCILLARY ONLY
Bacterial Vaginitis (gardnerella): NEGATIVE
Candida Glabrata: NEGATIVE
Chlamydia: NEGATIVE
Neisseria Gonorrhea: NEGATIVE
Trichomonas: NEGATIVE

## 2019-09-30 ENCOUNTER — Telehealth: Payer: Self-pay | Admitting: Internal Medicine

## 2019-09-30 NOTE — Telephone Encounter (Signed)
Patient called requesting Rx on metroNIDAZOLE (FLAGYL) 500 MG tablet to be called in at  [facility].  Please advise.

## 2019-10-02 MED ORDER — METRONIDAZOLE 500 MG PO TABS: 500.0000 mg | ORAL_TABLET | Freq: Two times a day (BID) | ORAL | 0 refills | Status: DC

## 2019-10-02 NOTE — Telephone Encounter (Signed)
Spoke with Dr. Amil Amen okay to refill. Rx sent to [facility]. Patient has been informed

## 2019-10-23 ENCOUNTER — Other Ambulatory Visit: Payer: Self-pay

## 2019-10-23 MED ORDER — FLUCONAZOLE 150 MG PO TABS: ORAL_TABLET | ORAL | 0 refills | Status: DC

## 2019-12-14 ENCOUNTER — Telehealth: Payer: Self-pay | Admitting: Internal Medicine

## 2019-12-14 NOTE — Telephone Encounter (Signed)
Patient called stating is having itchy, vaginal discharge - no odor; irritation on her vagina and lower back pain x a week. Patient denied pelvic pain or fever.  Patient contact number [phone]   Please triage.

## 2019-12-14 NOTE — Telephone Encounter (Signed)
Left message for patient to call back to see if he cn come in on Wednesday @ 2 pm.

## 2019-12-14 NOTE — Telephone Encounter (Signed)
Patient called back and is scheduled for Wednesday at 2 pm

## 2019-12-16 ENCOUNTER — Ambulatory Visit (INDEPENDENT_AMBULATORY_CARE_PROVIDER_SITE_OTHER): Payer: Medicaid Other | Admitting: Internal Medicine

## 2019-12-16 ENCOUNTER — Encounter: Payer: Self-pay | Admitting: Internal Medicine

## 2019-12-16 ENCOUNTER — Other Ambulatory Visit: Payer: Self-pay

## 2019-12-16 VITALS — BP 170/100 | HR 82 | Resp 12 | Ht 61.0 in | Wt 182.0 lb

## 2019-12-16 DIAGNOSIS — N898 Other specified noninflammatory disorders of vagina: Secondary | ICD-10-CM | POA: Diagnosis not present

## 2019-12-16 LAB — POCT WET PREP WITH KOH
KOH Prep POC: NEGATIVE
RBC Wet Prep HPF POC: NEGATIVE
Trichomonas, UA: NEGATIVE
Yeast Wet Prep HPF POC: NEGATIVE

## 2019-12-16 MED ORDER — METRONIDAZOLE 0.75 % VA GEL: VAGINAL | 0 refills | Status: DC

## 2019-12-16 MED ORDER — FLUCONAZOLE 150 MG PO TABS: ORAL_TABLET | ORAL | 0 refills | Status: DC

## 2019-12-16 NOTE — Progress Notes (Signed)
    Subjective:    Patient ID: [name], female   DOB: 08/28/1971, 49 y.o.   MRN: [id number]   HPI   Vaginal itching and discharge started about 1 week ago.  Cloudy and thin.  No odor.  No antibiotics or new medications. No swimming or other damp clothing.   She does have one partner, but having intercourse infrequently.  Last time was just before her symptoms started.   He does use a condom.  She is wondering if the condom was latex.   He denies any symptoms himself.  No pelvic pain   Current Meds  Medication Sig  . FLUoxetine (PROZAC) 40 MG capsule Take 1 capsule (40 mg total) by mouth daily.  . fluticasone (FLONASE) 50 MCG/ACT nasal spray Place 2 sprays into both nostrils daily.  . methocarbamol (ROBAXIN) 500 MG tablet Take 1 tablet (500 mg total) by mouth every 8 (eight) hours as needed for muscle spasms.  . metoprolol succinate (TOPROL-XL) 25 MG 24 hr tablet Take 25 mg by mouth daily.  . Multiple Vitamin (MULTIVITAMIN WITH MINERALS) TABS Take 1 tablet by mouth daily.  . traZODone (DESYREL) 50 MG tablet TAKE 1 TABLET BY MOUTH AT BEDTIME   No Known Allergies   Review of Systems    Objective:   BP (!) 170/100 (BP Location: Left Arm, Patient Position: Sitting, Cuff Size: Normal)   Pulse 82   Resp 12   Ht 5\' 1"  (1.549 m)   Wt 182 lb (82.6 kg)   LMP  (LMP Unknown)   BMI 34.39 kg/m   Physical Exam  NAD No flank tenderness. Abd:  S, NT, No HSM or mass + BS GU: mild inflammation of vulvar area posteriorly.  Thin white discharge without odor.  No vaginal or cervical mucosa inflammation or lesion.   Assessment & Plan   1.  Bacterial vaginosis--mild with concern for underlying yeast as well with external itching and inflammation.  Metrogel 1 applicator twice daily for 8 days. Then Fluconazole 150 mg daily for 2 days.

## 2019-12-16 NOTE — Addendum Note (Signed)
Addended by: Serafina Mitchell on: 12/16/2019 04:03 PM   Modules accepted: Orders

## 2019-12-18 LAB — GC/CHLAMYDIA PROBE AMP
Chlamydia trachomatis, NAA: NEGATIVE
Neisseria Gonorrhoeae by PCR: NEGATIVE

## 2020-01-06 ENCOUNTER — Telehealth: Payer: Self-pay | Admitting: Internal Medicine

## 2020-01-06 NOTE — Telephone Encounter (Signed)
Patient called inquiring lab results patient had done two weeks ago.  Pt. Can be contacted at [phone]   Please advise.

## 2020-01-06 NOTE — Telephone Encounter (Signed)
Spoke with patient. Lab results discussed. Patient verbalized understanding and states she was having green discharge las week and this week she has really thick discharge like cottage cheese. Per Dr. Amil Amen have patient coe in and do a self swab. Patient scheduled for tomorrow at 11 am.

## 2020-01-07 ENCOUNTER — Other Ambulatory Visit (INDEPENDENT_AMBULATORY_CARE_PROVIDER_SITE_OTHER): Payer: Medicaid Other

## 2020-01-07 DIAGNOSIS — N898 Other specified noninflammatory disorders of vagina: Secondary | ICD-10-CM | POA: Diagnosis not present

## 2020-01-07 DIAGNOSIS — B3731 Acute candidiasis of vulva and vagina: Secondary | ICD-10-CM

## 2020-01-07 DIAGNOSIS — B373 Candidiasis of vulva and vagina: Secondary | ICD-10-CM | POA: Diagnosis not present

## 2020-01-07 LAB — POCT WET PREP WITH KOH
Clue Cells Wet Prep HPF POC: NEGATIVE
KOH Prep POC: NEGATIVE
RBC Wet Prep HPF POC: NEGATIVE
Trichomonas, UA: NEGATIVE

## 2020-01-07 MED ORDER — FLUCONAZOLE 150 MG PO TABS: ORAL_TABLET | ORAL | 0 refills | Status: DC

## 2020-01-07 NOTE — Progress Notes (Signed)
Patient came in for self vaginal swab due to vaginal discharge.   She has white curd like discharge.  Not really itching.  Wet prep + for budding yeast. Fluconazole 150 mg daily for 3 days.

## 2020-01-30 ENCOUNTER — Other Ambulatory Visit: Payer: Self-pay | Admitting: Internal Medicine

## 2020-03-08 ENCOUNTER — Other Ambulatory Visit: Payer: Self-pay | Admitting: Internal Medicine

## 2020-03-08 ENCOUNTER — Ambulatory Visit
Admission: RE | Admit: 2020-03-08 | Discharge: 2020-03-08 | Disposition: A | Discharge status: Home / Self Care | Payer: Medicaid Other | Source: Ambulatory Visit | Attending: Internal Medicine | Admitting: Internal Medicine

## 2020-03-08 ENCOUNTER — Encounter: Payer: Self-pay | Admitting: Internal Medicine

## 2020-03-08 ENCOUNTER — Ambulatory Visit (INDEPENDENT_AMBULATORY_CARE_PROVIDER_SITE_OTHER): Payer: Medicaid Other | Admitting: Internal Medicine

## 2020-03-08 ENCOUNTER — Other Ambulatory Visit: Payer: Self-pay

## 2020-03-08 VITALS — BP 122/80 | HR 62 | Resp 12 | Ht 61.0 in | Wt 182.0 lb

## 2020-03-08 DIAGNOSIS — N289 Disorder of kidney and ureter, unspecified: Secondary | ICD-10-CM | POA: Diagnosis not present

## 2020-03-08 DIAGNOSIS — Z1231 Encounter for screening mammogram for malignant neoplasm of breast: Secondary | ICD-10-CM

## 2020-03-08 DIAGNOSIS — F32A Depression, unspecified: Secondary | ICD-10-CM

## 2020-03-08 DIAGNOSIS — R5383 Other fatigue: Secondary | ICD-10-CM | POA: Insufficient documentation

## 2020-03-08 DIAGNOSIS — F329 Major depressive disorder, single episode, unspecified: Secondary | ICD-10-CM

## 2020-03-08 DIAGNOSIS — R0789 Other chest pain: Secondary | ICD-10-CM | POA: Diagnosis not present

## 2020-03-08 DIAGNOSIS — Z72820 Sleep deprivation: Secondary | ICD-10-CM

## 2020-03-08 NOTE — Progress Notes (Signed)
Subjective:    Patient ID: [name], female   DOB: 09-03-1971, 49 y.o.   MRN: [id number]   HPI   1.  Reportedly told by Dr. Terrence Dupont, her cardiologist, that her renal function is not normal.  Not clear what she was told other than to stay off red and greasy meats per patient. Was told her bp and cholesterol was high maybe beginning of March.   Sounds like she sees him up to 4 times yearly. Patient has only come in for acute problems in past 2 years or more.    2.  Pain on left flank starting around January.  Has gradually worsened.  She has difficulty stating what movements set this off.  Currently sounds like anything.  Describes a sharp pain.  Points to her left posterior thoracic rib cage.  Feels nauseated with the pain.  Tries Tylenol, which does not really help.   Eating and having BMs do not seem to affect the pain.  Has had soft, more frequent BMs recently, but again, does not affect the pain. No dysuria, but perhaps increased frequency.  Urine looks darker than usual.  Not clear if her fluid intake is decreased.    3.  Fatigue:  Has had since end of December.  Just feels she doesn't have the energy for anything.  Not sleeping well.  May feel more down.  No suicidal thoughts.  No constipation--more the opposite.  Itchy dry skin.  Having bruising as well.  The latter generally on legs.  She has not brought this up to Dr. Terrence Dupont.   No melena or hematochezia No periods since 12.2020.  Is having hot flashes and night sweats.  She has a 62 and 51 yo at home.  They have done okay with virtual learning during the pandemic. Has gained almost 20 lbs since 2019--states she just does not feel like doing anything.  Current Meds  Medication Sig  . acetaminophen (TYLENOL) 500 MG tablet Take 500 mg by mouth every 6 (six) hours as needed.  Marland Kitchen amLODipine (NORVASC) 2.5 MG tablet Take 2.5 mg by mouth daily.  Marland Kitchen atorvastatin (LIPITOR) 20 MG tablet Take 20 mg by mouth daily.  Marland Kitchen FLUoxetine  (PROZAC) 40 MG capsule Take 1 capsule by mouth once daily  . fluticasone (FLONASE) 50 MCG/ACT nasal spray Use 2 spray(s) in each nostril once daily  . methocarbamol (ROBAXIN) 500 MG tablet Take 1 tablet (500 mg total) by mouth every 8 (eight) hours as needed for muscle spasms.  . metoprolol succinate (TOPROL-XL) 25 MG 24 hr tablet Take 25 mg by mouth daily.  . Multiple Vitamin (MULTIVITAMIN WITH MINERALS) TABS Take 1 tablet by mouth daily.  . nitroGLYCERIN (NITROSTAT) 0.4 MG SL tablet nitroglycerin 0.4 mg sublingual tablet  USE AS DIRECTED  . traZODone (DESYREL) 50 MG tablet TAKE 1 TABLET BY MOUTH AT BEDTIME  . [DISCONTINUED] Acetaminophen-Codeine 300-30 MG tablet acetaminophen 300 mg-codeine 30 mg tablet  TAKE 1 TABLET BY MOUTH EVERY 6 HOURS   No Known Allergies   Review of Systems    Objective:   BP 122/80 (BP Location: Left Arm, Patient Position: Sitting, Cuff Size: Normal)   Pulse 62   Resp 12   Ht 5\' 1"  (1.549 m)   Wt 182 lb (82.6 kg)   LMP  (LMP Unknown)   BMI 34.39 kg/m   Physical Exam  NAD HEENT:  PERRL, EOMI Neck:  Supple, No adenopathy, no thyromegaly Chest:  CTA.  Point tenderness of  left posterior rib--likely at level of T8 or so.  No crepitation or drop off. CV:  RRR with normal S1 and S2, No S3, S4 or murmur.  Radial and DP pulses normal and equal Abd:  S, NT, No HSM or mass, + BS   Assessment & Plan   1.  Possible renal insufficiency:  Not clear based on what she was told to do with her diet that this is correct.  Will send for Dr. Zenia Resides records. CMP today  2.  Left posterior chest wall pain--appears to be over a rib.  Send for rib films.  PT referral  3.  Fatigue/poor sleep/depression:  CBC, CMP, TSH.  May be secondary to poorly controlled Depression as well.  Will ask Dwan Bolt to evaluate and make recommendations.  4.  HM:  Encouraged COVID vaccination.  Mammogram.  Schedule for CPE with pap.  Discussed should have a yearly physical and generally  have been seeing her for acute issues.

## 2020-03-09 LAB — COMPREHENSIVE METABOLIC PANEL
ALT: 17 IU/L (ref 0–32)
AST: 22 IU/L (ref 0–40)
Albumin/Globulin Ratio: 1.8 (ref 1.2–2.2)
Albumin: 4.2 g/dL (ref 3.8–4.8)
Alkaline Phosphatase: 89 IU/L (ref 39–117)
BUN/Creatinine Ratio: 9 (ref 9–23)
BUN: 9 mg/dL (ref 6–24)
Bilirubin Total: 0.6 mg/dL (ref 0.0–1.2)
CO2: 22 mmol/L (ref 20–29)
Calcium: 9 mg/dL (ref 8.7–10.2)
Chloride: 103 mmol/L (ref 96–106)
Creatinine, Ser: 1.01 mg/dL — ABNORMAL HIGH (ref 0.57–1.00)
GFR calc Af Amer: 75 mL/min/{1.73_m2} (ref >59)
GFR calc non Af Amer: 65 mL/min/{1.73_m2} (ref >59)
Globulin, Total: 2.4 g/dL (ref 1.5–4.5)
Glucose: 75 mg/dL (ref 65–99)
Potassium: 4 mmol/L (ref 3.5–5.2)
Sodium: 140 mmol/L (ref 134–144)
Total Protein: 6.6 g/dL (ref 6.0–8.5)

## 2020-03-09 LAB — CBC WITH DIFFERENTIAL/PLATELET
Basophils Absolute: 0.1 10*3/uL (ref 0.0–0.2)
Basos: 1 %
EOS (ABSOLUTE): 0.2 10*3/uL (ref 0.0–0.4)
Eos: 3 %
Hematocrit: 42.1 % (ref 34.0–46.6)
Hemoglobin: 13.7 g/dL (ref 11.1–15.9)
Immature Grans (Abs): 0 10*3/uL (ref 0.0–0.1)
Immature Granulocytes: 0 %
Lymphocytes Absolute: 1.3 10*3/uL (ref 0.7–3.1)
Lymphs: 17 %
MCH: 31.2 pg (ref 26.6–33.0)
MCHC: 32.5 g/dL (ref 31.5–35.7)
MCV: 96 fL (ref 79–97)
Monocytes Absolute: 0.5 10*3/uL (ref 0.1–0.9)
Monocytes: 7 %
Neutrophils Absolute: 5.2 10*3/uL (ref 1.4–7.0)
Neutrophils: 72 %
Platelets: 259 10*3/uL (ref 150–450)
RBC: 4.39 x10E6/uL (ref 3.77–5.28)
RDW: 13 % (ref 11.7–15.4)
WBC: 7.3 10*3/uL (ref 3.4–10.8)

## 2020-03-09 LAB — TSH: TSH: 8.34 u[IU]/mL — ABNORMAL HIGH (ref 0.450–4.500)

## 2020-03-15 ENCOUNTER — Telehealth: Payer: Self-pay | Admitting: Clinical

## 2020-03-15 NOTE — Telephone Encounter (Signed)
LCSW was able to contact patient as a referral from PCP to see if still interested in therapy. *late note call made on 04/01 Appointment provided for 04/08 at 3pm in person

## 2020-03-16 LAB — T4, FREE

## 2020-03-16 LAB — SPECIMEN STATUS REPORT

## 2020-03-17 ENCOUNTER — Other Ambulatory Visit: Payer: Self-pay

## 2020-03-17 ENCOUNTER — Ambulatory Visit (INDEPENDENT_AMBULATORY_CARE_PROVIDER_SITE_OTHER): Payer: Self-pay | Admitting: Clinical

## 2020-03-17 ENCOUNTER — Other Ambulatory Visit: Payer: Medicaid Other

## 2020-03-17 DIAGNOSIS — F4310 Post-traumatic stress disorder, unspecified: Secondary | ICD-10-CM

## 2020-03-17 DIAGNOSIS — F332 Major depressive disorder, recurrent severe without psychotic features: Secondary | ICD-10-CM

## 2020-03-17 DIAGNOSIS — E039 Hypothyroidism, unspecified: Secondary | ICD-10-CM

## 2020-03-18 LAB — T4, FREE: Free T4: 1.03 ng/dL (ref 0.82–1.77)

## 2020-03-24 ENCOUNTER — Ambulatory Visit: Payer: Medicaid Other | Attending: Internal Medicine

## 2020-03-24 ENCOUNTER — Other Ambulatory Visit: Payer: Self-pay

## 2020-03-24 DIAGNOSIS — M546 Pain in thoracic spine: Secondary | ICD-10-CM

## 2020-03-24 DIAGNOSIS — M62830 Muscle spasm of back: Secondary | ICD-10-CM | POA: Diagnosis present

## 2020-03-24 NOTE — Patient Instructions (Signed)
QL and lat stretch in sitting /stand and side lye 2-3 reps 10-30 sec 2-3x/day.   Deep breathing with  Exhalation fully x 3-5 reps 2x/day

## 2020-03-24 NOTE — Therapy (Signed)
[place], [place], [address] Phone: [phone]   Fax:  [phone]  Physical Therapy Evaluation  Patient Details  Name: [name] MRN: [id number] Date of Birth: July 08, 1971 Referring Provider (PT): Jacqualine Code ,MD   Encounter Date: 03/24/2020  PT End of Session - 03/24/20 1410    Visit Number  1    Number of Visits  10    Date for PT Re-Evaluation  05/06/20    Authorization Type  Medi caid    PT Start Time  0203    PT Stop Time  0245    PT Time Calculation (min)  42 min    Activity Tolerance  Patient limited by pain    Behavior During Therapy  [facility] for tasks assessed/performed       Past Medical History:  Diagnosis Date  . Depression   . 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 History of anemia   . 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 History of hypertension 2004   PIH  . History of hypothyroidism    per pt yrs ago -- resolved  . History of MI (myocardial infarction) 02-05-2003----  resolved   in setting [facility] during pregnancy--- SVD with amniotic fluid embolism, DIC, respiratory failure with pulmonary edema (pt was venterd)  . Irritable bowel syndrome     Past Surgical History:  Procedure Laterality Date  . ANTERIOR CRUCIATE LIGAMENT (ACL) REVISION Left 07/24/2018   Procedure: Left knee arthroscopy, debridement, allograft revision anterior cruciate ligament reconstruction,  hardware removal deep, partialmedial and lateral  menisectomy;  Surgeon: Sydnee Cabal, MD;  Location: [facility];  Service: Orthopedics;  Laterality: Left;  2 hrs General with adductor canal block  . ANTERIOR CRUCIATE LIGAMENT REPAIR Left 04-20-2003    dr Rhona Raider   [facility]  . CARDIAC CATHETERIZATION  07-01-2003   dr Terrence Dupont    [facility]   positive stress cardiolite:  normal coronaries,  LV w/ mild  global hypokinesis, ef 50%  . CURRETTAGE FOR RETAINED PRODUCTS OF CONCEPTION  11-25-2007      @[facility]    FOR PORTPARTUM HEMORRHAGE  . HYSTEROSCOPY WITH NOVASURE N/A 11/27/2013   Procedure: HYSTEROSCOPY WITH NOVASURE;  Surgeon: Lavonia Drafts, MD;  Location: [facility];  Service: Gynecology;  Laterality: N/A;  . Left ACL reconstruction Left 08/2018   MCL sprain as well.  Marland Kitchen POST PARTUM TUBAL LIGATION WITH FILSHIE CLIPS  11-17-2002     dr leggett  [facility]  . TRANSTHORACIC ECHOCARDIOGRAM  05/09/2010   ef 55-60%/  trivial MR and TR/  trivial pericardial effusion  . ULNAR SHORTENING WITH BONE GRAFT Right 03/28/2017   Procedure: RIGHT ULNA WRIST OSTEOTOMY;  Surgeon: Daryll Brod, MD;  Location: [place];  Service: Orthopedics;  Laterality: Right;  . WRIST ARTHROSCOPY WITH FOVEAL TRIANGULAR FIBROCARTILAGE COMPLEX REPAIR Right 09/25/2016   Procedure: Right WRIST ARTHROSCOPY WITH FOVEAL TRIANGULAR FIBROCARTILAGE COMPLEX REPAIR;  Surgeon: Daryll Brod, MD;  Location: [place];  Service: Orthopedics;  Laterality: Right;    There were no vitals filed for this visit.   Subjective Assessment - 03/24/20 1405    Subjective  She reported back pain.   Pain started 12/2019.  She reports no injury.   Bothers with activity.    Limitations  Standing;Walking;House hold activities   all activity   Diagnostic tests  blood work  Patient Stated Goals  Eliminate pain    Currently in Pain?  Yes    Pain Score  5     Pain Location  Back    Pain Orientation  Left;Mid    Pain Descriptors / Indicators  Aching    Pain Type  Chronic pain    Pain Onset  More than a month ago    Pain Frequency  Constant    Aggravating Factors   Home tasks,    Pain Relieving Factors  tylenol, cold pack.         [facility] PT Assessment - 03/24/20 0001      Assessment   Medical Diagnosis  Thoracic pain    Referring Provider (PT)  Jacqualine Code ,MD    Onset Date/Surgical Date  --   12/2019   Next MD Visit  05/2020     Prior Therapy  No      Precautions   Precautions  None      Restrictions   Weight Bearing Restrictions  No      Balance Screen   Has the patient fallen in the past 6 months  No      Prior Function   Level of Independence  Independent    Vocation  Unemployed      Cognition   Overall Cognitive Status  Within Functional Limits for tasks assessed      Posture/Postural Control   Posture Comments  increaed lordosis      ROM / Strength   AROM / PROM / Strength  AROM;Strength      AROM   AROM Assessment Site  Cervical;Lumbar;Thoracic    Lumbar Flexion  70   more discomfort    Lumbar Extension  20    Lumbar - Right Side Bend  20    Lumbar - Left Side Bend  12    Lumbar - Right Rotation  WNL    Lumbar - Left Rotation  WNL   more discomfort     Strength   Overall Strength Comments  WNL                Objective measurements completed on examination: See above findings.              PT Education - 03/24/20 1454    Education Details  POC HEP,    Person(s) Educated  Patient    Methods  Explanation;Demonstration;Tactile cues;Verbal cues;Handout    Comprehension  Verbalized understanding;Returned demonstration       PT Short Term Goals - 03/24/20 1449      PT SHORT TERM GOAL #1   Title  She will be independent with inital HEP    Baseline  no program    Time  3    Period  Weeks    Status  New      PT SHORT TERM GOAL #2   Title  She will report pain decr 25% or more  with home tasks    Baseline  mod to severe pain with home tasks and shopping    Time  3    Period  Weeks    Status  New        PT Long Term Goals - 03/24/20 1451      PT LONG TERM GOAL #1   Title  She will be independent with all hEP issued    Baseline  Independent with intial hEP    Time  6    Period  Weeks  Status  New      PT LONG TERM GOAL #2   Title  She will report pain as intermittant and decreased 75% or more with home tasks .    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 She is tender in this area on LT side pf back without tenderness in paraspinals and central spine.  Her ROM is WNL More pain with LT sided movments.   She should imprrove with skilled PT  of manual and exercise  with possible DN. She should do her HEP cponsistently    Personal Factors and Comorbidities  Time since onset of injury/illness/exacerbation;Comorbidity 1    Comorbidities  depression    Examination-Participation Restrictions  Laundry;Cleaning;Meal Prep    Stability/Clinical Decision Making  Stable/Uncomplicated    Clinical Decision Making  Low    Rehab Potential  Good    PT Frequency  --   3 visits   PT Duration  3 weeks   then 2x/week for 3-4 weeks   PT Treatment/Interventions  Ultrasound;Moist Heat;Electrical Stimulation;Therapeutic exercise;Patient/family education;Manual techniques;Dry needling;Passive range of motion;Taping    PT Next Visit Plan  review HEP and add as need    manual and modalities.    PT Home Exercise Plan  QL and lat stretching and deep breathing    Consulted and Agree with Plan of Care  Patient       Patient will benefit from skilled therapeutic intervention in order to improve the following deficits and impairments:  Pain, Postural dysfunction, Increased muscle spasms  Visit Diagnosis: Pain in thoracic spine  Muscle spasm of back     Problem List Patient Active Problem List   Diagnosis Date Noted  . Fatigue 03/08/2020  . Poor sleep 03/08/2020  . S/P ACL reconstruction 07/24/2018  . Environmental and seasonal allergies 03/12/2017  . Insomnia 12/11/2016  . Pulmonary  embolism complicating pregnancy 0000000  . Hypothyroidism 07/30/2007  . Depression 07/30/2007  . MYOCARDIAL INFARCTION, HX OF 07/30/2007  . EMBOLISM, AMNIOTIC FLUID, DELIVERED W/PP 07/30/2007  . HX, PERSONAL, SUDDEN CARDIAC ARRREST 07/30/2007    Darrel Hoover  PT 03/24/2020, 2:55 PM  [facility] [address] [place], [place], [address] Phone: [phone]   Fax:  [phone]  Name: [name] MRN: [id number] Date of Birth: Oct 02, 1971

## 2020-03-31 ENCOUNTER — Ambulatory Visit (INDEPENDENT_AMBULATORY_CARE_PROVIDER_SITE_OTHER): Payer: Self-pay | Admitting: Clinical

## 2020-03-31 ENCOUNTER — Other Ambulatory Visit: Payer: Self-pay

## 2020-03-31 DIAGNOSIS — F4310 Post-traumatic stress disorder, unspecified: Secondary | ICD-10-CM
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
## 2020-04-04 ENCOUNTER — Ambulatory Visit: Payer: Medicaid Other

## 2020-04-04 ENCOUNTER — Other Ambulatory Visit: Payer: Self-pay

## 2020-04-04 DIAGNOSIS — M546 Pain in thoracic spine: Secondary | ICD-10-CM | POA: Diagnosis not present

## 2020-04-04 DIAGNOSIS — M62830 Muscle spasm of back: Secondary | ICD-10-CM

## 2020-04-04 NOTE — Therapy (Signed)
[place], [place], [address] Phone: [phone]   Fax:  [phone]  Physical Therapy Treatment  Patient Details  Name: [name] MRN: [id number] Date of Birth: December 01, 1971 Referring Provider (PT): Jacqualine Code ,MD   Encounter Date: 04/04/2020  PT End of Session - 04/04/20 1406    Visit Number  2    Number of Visits  10    Date for PT Re-Evaluation  05/06/20    Authorization Type  Medi caid    PT Start Time  0206    PT Stop Time  0250    PT Time Calculation (min)  44 min    Activity Tolerance  Patient limited by pain    Behavior During Therapy  [facility] for tasks assessed/performed       Past Medical History:  Diagnosis Date  . Depression   . 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 History of anemia   . 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 History of hypertension 2004   PIH  . History of hypothyroidism    per pt yrs ago -- resolved  . History of MI (myocardial infarction) 02-05-2003----  resolved   in setting [place] during pregnancy--- SVD with amniotic fluid embolism, DIC, respiratory failure with pulmonary edema (pt was venterd)  . Irritable bowel syndrome     Past Surgical History:  Procedure Laterality Date  . ANTERIOR CRUCIATE LIGAMENT (ACL) REVISION Left 07/24/2018   Procedure: Left knee arthroscopy, debridement, allograft revision anterior cruciate ligament reconstruction,  hardware removal deep, partialmedial and lateral  menisectomy;  Surgeon: Sydnee Cabal, MD;  Location: [facility];  Service: Orthopedics;  Laterality: Left;  2 hrs General with adductor canal block  . ANTERIOR CRUCIATE LIGAMENT REPAIR Left 04-20-2003    dr Rhona Raider   [facility]  . CARDIAC CATHETERIZATION  07-01-2003   dr Terrence Dupont    [facility]   positive stress cardiolite:  normal coronaries,  LV w/ mild  global hypokinesis, ef 50%  . CURRETTAGE FOR RETAINED PRODUCTS OF CONCEPTION  11-25-2007      @[facility]    FOR PORTPARTUM HEMORRHAGE  . HYSTEROSCOPY WITH NOVASURE N/A 11/27/2013   Procedure: HYSTEROSCOPY WITH NOVASURE;  Surgeon: Lavonia Drafts, MD;  Location: [facility];  Service: Gynecology;  Laterality: N/A;  . Left ACL reconstruction Left 08/2018   MCL sprain as well.  Marland Kitchen POST PARTUM TUBAL LIGATION WITH FILSHIE CLIPS  11-17-2002     dr leggett  [place]  . TRANSTHORACIC ECHOCARDIOGRAM  05/09/2010   ef 55-60%/  trivial MR and TR/  trivial pericardial effusion  . ULNAR SHORTENING WITH BONE GRAFT Right 03/28/2017   Procedure: RIGHT ULNA WRIST OSTEOTOMY;  Surgeon: Daryll Brod, MD;  Location: [facility];  Service: Orthopedics;  Laterality: Right;  . WRIST ARTHROSCOPY WITH FOVEAL TRIANGULAR FIBROCARTILAGE COMPLEX REPAIR Right 09/25/2016   Procedure: Right WRIST ARTHROSCOPY WITH FOVEAL TRIANGULAR FIBROCARTILAGE COMPLEX REPAIR;  Surgeon: Daryll Brod, MD;  Location: [facility];  Service: Orthopedics;  Laterality: Right;    There were no vitals filed for this visit.  Subjective Assessment - 04/04/20 1407    Subjective  Pain is a little better. She did HEP. No issues.    Currently in Pain?  Yes    Pain Score  3     Pain Location  Back    Pain Orientation  Left;Mid  Pain Descriptors / Indicators  Aching    Pain Type  Chronic pain    Pain Onset  More than a month ago    Pain Frequency  Constant    Aggravating Factors   home activity    Pain Relieving Factors  cold, tylennol                       [facility] Adult PT Treatment/Exercise - 04/04/20 0001      Exercises   Exercises  Lumbar      Lumbar Exercises: Stretches   Other Lumbar Stretch Exercise  side bending to RT with body pull x 2 20 sec at free motion.       Lumbar Exercises: Aerobic   UBE (Upper Arm Bike)  L1 4 min      Modalities   Modalities  Moist Heat      Moist Heat Therapy   Number  Minutes Moist Heat  --   during stretching and 5 min at tend of session   Moist Heat Location  Lumbar Spine   LT     Manual Therapy   Manual Therapy  Soft tissue mobilization;Passive ROM;Joint mobilization    Joint Mobilization  GR1-2 PA mobs to T10-12    Soft tissue mobilization  LT flank, mid to lower back  deep breathing with exhalatiion emphasis    Passive ROM  flank , lats and QL  fascil release               PT Short Term Goals - 03/24/20 1449      PT SHORT TERM GOAL #1   Title  She will be independent with inital HEP    Baseline  no program    Time  3    Period  Weeks    Status  New      PT SHORT TERM GOAL #2   Title  She will report pain decr 25% or more  with home tasks    Baseline  mod to severe pain with home tasks and shopping    Time  3    Period  Weeks    Status  New        PT Long Term Goals - 03/24/20 1451      PT LONG TERM GOAL #1   Title  She will be independent with all hEP issued    Baseline  Independent with intial hEP    Time  6    Period  Weeks    Status  New      PT LONG TERM GOAL #2   Title  She will report pain as intermittant and decreased 75% or more with home tasks .    Baseline  30% improrved    Time  6    Period  Weeks    Status  New      PT LONG TERM GOAL #3   Title  She will report able to shop with 1-2 max pain    Baseline  Mod to severe pain with shopping at eval    Time  6    Period  Weeks    Status  New            Plan - 04/04/20 1406    Clinical Impression Statement  Doing some better with less pain. Quadratus appears to be pain tisue as with standing stretch she felt increased soreness/tightness but after treatment she felt better. She wil continue to stretch at  home.    PT Treatment/Interventions  Ultrasound;Moist Heat;Electrical Stimulation;Therapeutic exercise;Patient/family education;Manual techniques;Dry needling;Passive range of motion;Taping    PT Next Visit Plan  review HEP and add as need     manual and modalities.    PT Home Exercise Plan  QL and lat stretching and deep breathing    Consulted and Agree with Plan of Care  Patient       Patient will benefit from skilled therapeutic intervention in order to improve the following deficits and impairments:  Pain, Postural dysfunction, Increased muscle spasms  Visit Diagnosis: Pain in thoracic spine  Muscle spasm of back     Problem List Patient Active Problem List   Diagnosis Date Noted  . Fatigue 03/08/2020  . Poor sleep 03/08/2020  . S/P ACL reconstruction 07/24/2018  . Environmental and seasonal allergies 03/12/2017  . Insomnia 12/11/2016  . Pulmonary embolism complicating pregnancy 0000000  . Hypothyroidism 07/30/2007  . Depression 07/30/2007  . MYOCARDIAL INFARCTION, HX OF 07/30/2007  . EMBOLISM, AMNIOTIC FLUID, DELIVERED W/PP 07/30/2007  . HX, PERSONAL, SUDDEN CARDIAC ARRREST 07/30/2007    Darrel Hoover  PT 04/04/2020, 2:51 PM  [facility] [address] [place], [place], [address] Phone: [phone]   Fax:  [phone]  Name: [name] MRN: [id number] Date of Birth: [birth year]

## 2020-04-11 ENCOUNTER — Ambulatory Visit: Payer: Medicaid Other | Attending: Internal Medicine

## 2020-04-11 DIAGNOSIS — M62830 Muscle spasm of back: Secondary | ICD-10-CM | POA: Insufficient documentation

## 2020-04-11 DIAGNOSIS — M546 Pain in thoracic spine: Secondary | ICD-10-CM | POA: Insufficient documentation

## 2020-04-13 ENCOUNTER — Telehealth: Payer: Self-pay | Admitting: Clinical

## 2020-04-13 NOTE — Telephone Encounter (Signed)
LCSW contact patient as a reminder for therapy appointment 05/06 LCSW left message

## 2020-04-14 ENCOUNTER — Other Ambulatory Visit: Payer: Medicaid Other | Admitting: Clinical

## 2020-04-18 ENCOUNTER — Other Ambulatory Visit: Payer: Self-pay

## 2020-04-18 ENCOUNTER — Ambulatory Visit: Payer: Medicaid Other

## 2020-04-18 DIAGNOSIS — M546 Pain in thoracic spine: Secondary | ICD-10-CM

## 2020-04-18 DIAGNOSIS — M62830 Muscle spasm of back: Secondary | ICD-10-CM

## 2020-04-18 NOTE — Therapy (Signed)
[place], [place], [address] Phone: [phone]   Fax:  [phone]  Patient Details  Name: [name] MRN: [id number] Date of Birth: 15-Jan-1971 Referring Provider:  Mack Hook, MD  Encounter Date: 04/18/2020  Discharge visit:     Ms Gloster felt she was doing well and did not need further PT as she has had no pain for normal activits  In past week. She feels she does not need any more exercises as the ones she has are eliminating the pain. She was advised that she could return if needed and to continue HEP HEP for extended period of time.  We did 3 visits and she will be discharge  Being pleased with progress. All goals were met.    Darrel Hoover  PT  04/18/2020, 3:03 PM  [facility] [address] [place], [place], [address] Phone: [phone]   Fax:  [phone]

## 2020-04-28 ENCOUNTER — Other Ambulatory Visit: Payer: Medicaid Other | Admitting: Clinical

## 2020-05-06 ENCOUNTER — Other Ambulatory Visit: Payer: Medicaid Other | Admitting: Clinical

## 2020-05-06 ENCOUNTER — Telehealth: Payer: Self-pay | Admitting: Internal Medicine

## 2020-05-06 ENCOUNTER — Telehealth: Payer: Self-pay | Admitting: Clinical

## 2020-05-06 NOTE — Telephone Encounter (Signed)
Patient called requesting to speak with Ileana our LCSW after being notified of her daughter's death who was hit by a truck driver. Sent condolences from all [facility] staff and informed patient Thomos Lemons was gone for the day but will contact her and provide pt's phone number to call her back. Information was shared with Ileana.

## 2020-05-06 NOTE — Telephone Encounter (Signed)
LCSW contacted regarding patient canceled appointment and see if she would like to reschedule. LCSW left message.

## 2020-05-10 ENCOUNTER — Telehealth: Payer: Self-pay | Admitting: Clinical

## 2020-05-10 NOTE — Telephone Encounter (Signed)
LCSW contacted patient via google voice to check in following information received that daughter has passed away in a car accident. LCSW provided reflective listening as patient shared information. LCSW offered condolences to she and her family. LCSW asked if she was alone during this time, patient shared she is with family. LCSW asked patient if there was anything she needed from this LCSW, patient shared not at this time, and all she wanted was to rest. LCSW reminded patient she is welcome to come to clinic to be seen by LCSW and of crisis line.  Phone call was completed on 05/28 after hours.

## 2020-05-31 ENCOUNTER — Telehealth: Payer: Self-pay | Admitting: Internal Medicine

## 2020-05-31 NOTE — Telephone Encounter (Signed)
Patient on the phone requesting an increase on FLUoxetine (PROZAC) 40 MG capsule; patient stated her daughter was recently killed and is having a hard time dealing with it.  transferring call to [place] to speak and triage patient.

## 2020-05-31 NOTE — Telephone Encounter (Signed)
Spoke with patient. Informed she may have to come in for office visit. Informed I would speak with Dr. Amil Amen and call her back. Patient verbalized understanding.

## 2020-06-01 ENCOUNTER — Telehealth: Payer: Self-pay | Admitting: Clinical

## 2020-06-01 NOTE — Telephone Encounter (Signed)
LCSW spoke with patient following speaking with nurse, patient agreed to come see LCSW to further discuss grief.

## 2020-06-02 NOTE — Telephone Encounter (Signed)
Spoke with Dr. Amil Amen regarding issue that patient stated she was having. Daughter passed away on 25-May-2020. States she has felt angry and sad, just going through all different stages of grief. Per Dr. Amil Amen patient needs to [facility] and will discuss further at her appointment on 06/10/20. Informed patient of message and patient is open to seeing Ileana. Phone call was transferred to [facility] to take over and schedule appointment for patient.

## 2020-06-03 ENCOUNTER — Other Ambulatory Visit: Payer: Medicaid Other | Admitting: Clinical

## 2020-06-10 ENCOUNTER — Encounter: Payer: Medicaid Other | Admitting: Internal Medicine

## 2020-06-10 ENCOUNTER — Telehealth: Payer: Self-pay | Admitting: Clinical

## 2020-06-10 NOTE — Telephone Encounter (Signed)
LCSW contacted patient to check in due to missed session and missed PCP appt. Patient reports due to car trouble. LCSW asked if she felt comfortable for LCSW to come in, would like time or if she needed a grief counselor. Patient reports "I don't know" LCSW informed will be available to for any assistance. Patient thanked LCSW.

## 2020-07-21 ENCOUNTER — Telehealth: Payer: Self-pay

## 2020-07-21 MED ORDER — FLUCONAZOLE 150 MG PO TABS: ORAL_TABLET | ORAL | 0 refills | Status: DC

## 2020-07-21 NOTE — Telephone Encounter (Signed)
Patient called stating she is having vaginal itching x 2 weeks. States she has tried [employer] 7 day and helped some but not much. Patient states she is just having itching and discharge no odor.  Per Dr. Amil Amen patient can have diflucan 150 mg 1 daily for 3 days. Rx sent to pharmacy and patient has been informed.

## 2020-09-05 ENCOUNTER — Ambulatory Visit: Payer: Medicaid Other | Admitting: Internal Medicine

## 2020-09-05 ENCOUNTER — Ambulatory Visit: Payer: Medicaid Other | Admitting: Clinical

## 2020-09-05 ENCOUNTER — Encounter: Payer: Self-pay | Admitting: Internal Medicine

## 2020-09-05 VITALS — BP 126/68 | HR 68 | Resp 14 | Ht 62.0 in | Wt 184.0 lb

## 2020-09-05 DIAGNOSIS — K580 Irritable bowel syndrome with diarrhea: Secondary | ICD-10-CM

## 2020-09-05 DIAGNOSIS — F4321 Adjustment disorder with depressed mood: Secondary | ICD-10-CM | POA: Diagnosis not present

## 2020-09-05 DIAGNOSIS — L720 Epidermal cyst: Secondary | ICD-10-CM | POA: Diagnosis not present

## 2020-09-05 DIAGNOSIS — N898 Other specified noninflammatory disorders of vagina: Secondary | ICD-10-CM | POA: Diagnosis not present

## 2020-09-05 DIAGNOSIS — F32A Depression, unspecified: Secondary | ICD-10-CM

## 2020-09-05 DIAGNOSIS — R300 Dysuria: Secondary | ICD-10-CM | POA: Diagnosis not present

## 2020-09-05 DIAGNOSIS — F329 Major depressive disorder, single episode, unspecified: Secondary | ICD-10-CM

## 2020-09-05 DIAGNOSIS — Z1231 Encounter for screening mammogram for malignant neoplasm of breast: Secondary | ICD-10-CM

## 2020-09-05 MED ORDER — FLUCONAZOLE 150 MG PO TABS: 150.0000 mg | ORAL_TABLET | Freq: Once | ORAL | 0 refills | Status: AC

## 2020-09-05 MED ORDER — METRONIDAZOLE 0.75 % VA GEL: VAGINAL | 0 refills | Status: DC

## 2020-09-05 MED ORDER — DICYCLOMINE HCL 20 MG PO TABS: ORAL_TABLET | ORAL | 2 refills | Status: DC

## 2020-09-05 NOTE — Patient Instructions (Signed)
Metamucil or citrucel--start at a half dose and in 2 weeks, see if can increase to full dose.

## 2020-09-05 NOTE — Progress Notes (Signed)
Subjective:    Patient ID: [name], female   DOB: 15-Dec-1970, 49 y.o.   MRN: [id number]   HPI  1.  Vaginal discharge and itching for 2 months.  Used Monistat  First and had some improvement.  We prescribed Diflucan 150 mg daily for 3 days, also with improvement, but then 1 week later, discharge back.    No odor to discharge.  Discharge is clear and sticky.    She has not been sexually active for past 3 months.    Burns a bit on external vaginal area with urination.    No vaginal bleeding.  No pelvic pain.    2.  Was scheduled for mammogram and noted bump on her mid right breast.  Reportedly, [facility] requested she have this examined before performing mammogram.  3.  IBS/Depression/Grief:  Daughter killed earlier in year in [place] involving a drunk driver.  She is to begin counseling shortly with Dwan Bolt, LCSW.  States she has had increased difficulties with IBS in recent months.  "food just goes right through me"  No melena, hematochezia.  Sometimes, wakes her from sleep.  Lots of bloating with meals.  Current Meds  Medication Sig  . acetaminophen (TYLENOL) 500 MG tablet Take 500 mg by mouth every 6 (six) hours as needed.  Marland Kitchen FLUoxetine (PROZAC) 40 MG capsule Take 1 capsule by mouth once daily  . fluticasone (FLONASE) 50 MCG/ACT nasal spray Use 2 spray(s) in each nostril once daily  . Multiple Vitamin (MULTIVITAMIN WITH MINERALS) TABS Take 1 tablet by mouth daily.  . traZODone (DESYREL) 50 MG tablet TAKE 1 TABLET BY MOUTH AT BEDTIME   No Known Allergies   Review of Systems    Objective:   BP 126/68 (BP Location: Right Arm, Patient Position: Sitting, Cuff Size: Normal)   Pulse 68   Resp 14   Ht 5\' 2"  (1.575 m)   Wt 184 lb (83.5 kg)   BMI 33.65 kg/m   Physical Exam  NAD HEENT:   PERRL, EOMI,  Neck:  Supple, No adenopathy, no thyromegaly Chest:  CTA Breast:  Right medial breast with 1 cm epidermoid cyst just under skin surface.  Some overlying  hyperpigmentation as if has been manipulated frequently.  Able to express small amount of thick white cheesy substance.  No overlying erythema.   Abd:  S, NT, No HSM or mass. + BS GU:  Normal external female genitalia.  Thick off white vaginal discharge up about cervix, but not noted in lower vaginal canal.  No vaginal or cervical mucosal inflammation.  No CMT.  No uterine or adnexal mass or tenderness.    UA normal. Wet prep:  + clue cells with mild whiff.  Assessment & Plan   1.  Vaginal discharge:  Prefers topical vaginal Metronidazole.  1 applicatorful twice daily for 5 days.  Fluconazole 150 mg the day after completion as she tends to get yeast after treatment.  Will repeat 6 month treatment with twice weekly vaginal Metronidazole if continues to recur.  2.  IBS:  Antispasmodic with dicyclomine 20 mg before meals as needed.  Gradually increase fiber laxative.  3.  Epidermoid cyst, right breast:  Discussed only way to get rid of this permanently would be through excision of entire cyst and wall.  Could refer to surgery if decides to pursue. Screening and not diagnostic mammogram reordered  4.  Depression/grief:  Counseling with Dwan Bolt, LCSW.  Continue Fluoxetine and Trazodone.  Follow up  in 4 months for CPE

## 2020-09-06 LAB — POCT URINALYSIS DIPSTICK
Bilirubin, UA: NEGATIVE
Blood, UA: NEGATIVE
Glucose, UA: NEGATIVE
Ketones, UA: NEGATIVE
Leukocytes, UA: NEGATIVE
Nitrite, UA: NEGATIVE
Protein, UA: NEGATIVE
Spec Grav, UA: 1.015 (ref 1.010–1.025)
Urobilinogen, UA: 0.2 E.U./dL
pH, UA: 6 (ref 5.0–8.0)

## 2020-09-06 LAB — POCT WET PREP WITH KOH
KOH Prep POC: NEGATIVE
RBC Wet Prep HPF POC: NEGATIVE
Trichomonas, UA: NEGATIVE
Yeast Wet Prep HPF POC: NEGATIVE

## 2020-09-08 LAB — GC/CHLAMYDIA PROBE AMP
Chlamydia trachomatis, NAA: NEGATIVE
Neisseria Gonorrhoeae by PCR: NEGATIVE

## 2020-09-13 ENCOUNTER — Other Ambulatory Visit: Payer: Medicaid Other | Admitting: Clinical

## 2020-09-14 ENCOUNTER — Other Ambulatory Visit: Payer: Self-pay | Admitting: Internal Medicine

## 2020-09-20 ENCOUNTER — Ambulatory Visit: Payer: Medicaid Other | Admitting: Clinical

## 2020-09-20 DIAGNOSIS — F332 Major depressive disorder, recurrent severe without psychotic features: Secondary | ICD-10-CM

## 2020-09-20 DIAGNOSIS — F4321 Adjustment disorder with depressed mood: Secondary | ICD-10-CM

## 2020-09-20 NOTE — Progress Notes (Signed)
THERAPY PROGRESS NOTE  Session Time: 3:00p-4:00pm Participation Level: Active Behavioral Response: CasualAlertDepressed Type of Therapy: Individual Therapy Treatment Goals addressed: Coping and process grief.    Purpose: LCSW met with client for routine individual therapy to work towards treatment goals: coping and process grief.   Intervention:LCSW met with client for routine individual therapy to work towards treatment goals: coping and process grief. LCSW provided a brief check in of how she is feeling today. LCSW provided patient space to process these feelings. For this session LCSW and patient discussed what is grief. LCSW had patient identify her interpretation of this word and goal was to identify the other perspective of grief from the stages of grief handout.  LCSW discussed the stages of grief and the tasks of grief from [employer]. LCSW read the myths listed for patient to identify if some of these she reasonates and shared the facts from it listed. LCSW and patient identified how she can continue her relationship with her daughter (who passed away) such as by talking to her daughter. LCSW utilized intervention of Supportive with the discussions as she processed her grief and painful emotions. LCSW discussed two homework assignments such a journaling using prompts from [employer], part 1) just breathe, part b)getting comfortable with daughters story (how did it happened). Second part of homework assignment to help with grief process is by using grief cards and having patient interpret them and how she reflects to her personal experience. One was practiced in session and the second one she took home. LCSW informed will be using these grief cards going forward. LCSW assessed for SI/HI/command psychosis and discussed crisis procedures.  Effectiveness: Patient is alert x4 appeared to have low affect. Patient reports today she feels okay, when she thinks it's getting easier it is not.  Patient shared friends have asked her to go out and she has been declining to go out. Reports today she did some groceries and came to session. LCSW praised with some outings and did ask her what she feels is keeping her isolated from her support. Patient reports she feels safe at home, reports feeling scared as if something awful could happen and also not wanting to be around people in general. Reports feeling safe by herself.   Patient participated in the stages of grief discussion, was able to interpret the word grief as she goes through sadness, anger and guilt. Patient processed her anger and guilt towards self from why was her daughter taken from her, that she was not able to protect her daughter, or was not good mother. Patient reports these thoughts have become constant leading to thoughts of suicide, processed these thoughts go back to this guilt. Patient identified her anchor is her responsibility to children, although she reported her family is able to take care of them she acknowledged and processed this anchor by identifying her kids only have her as their father as also passed away. Patient reports she finds herself putting a "mask" to hide how she is feeling to them so this is motivating her to work towards healing.   Patient participated in the myths of the handout and was able to reflect to her thinking. Patient reports she has done journaling in the past and is willing to give this a try. Patient practiced interpreting the quote from grief deck and reflected on it to how she is feeling. Patient shared her take away from this session is there it is okay to share these feelings as bottling them up can  prevent from adjusting to new life.  Intervention was effective as patient was able to reflect and participated in the session. Progress towards goal is Ongoing. Patient denied active suicidal/homicidal/active psychosis.  Plan Patient offered next appointment for: 09/28/2020 @ 3pm.   Diagnosis:  Bereavement and Major Depression, Recurrent severe    Lujean Rave, LCSW 09/20/2020

## 2020-09-28 ENCOUNTER — Ambulatory Visit: Payer: Medicaid Other | Admitting: Clinical

## 2020-09-28 DIAGNOSIS — F4321 Adjustment disorder with depressed mood: Secondary | ICD-10-CM

## 2020-09-28 DIAGNOSIS — F332 Major depressive disorder, recurrent severe without psychotic features: Secondary | ICD-10-CM

## 2020-09-29 NOTE — Progress Notes (Signed)
   THERAPY PROGRESS NOTE  Session Time: 3:00-4:00pm Participation Level: Active Behavioral Response: CasualAlertDepressed Type of Therapy: Individual Therapy Treatment Goals addressed: Coping with depression and grief.    Purpose: LCSW met with client for routine individual therapy to work towards treatment goals: coping with depression and grief.   Intervention:   LCSW met with client for routine individual therapy to work towards treatment goals: coping with depression and grief. LCSW provided brief a check in how she is feeling today. LCSW provided patient opportunity to process any thoughts and feelings of this week. LCSW asked patients updates on homework assignment and healing card. LCSW provided reflective listening as patient shared current feelings. For this session due to patient reports ongoing depression and grief utilized last week's prompt to guide retelling her daughters story, for patient to become comfortable talking about her. Doing this exercise patient was able to identify strategies for motivation during these depressive episodes. LCSW provided patient new healing for the card and instructed to review interpret and next week will process it further. LCSW assessed for SI/HI/command psychosis.  Effectiveness: Patient is alert x3 flat affect. Patient reports at first feeling okay but shared someone at the bank pointed out her affect and identified feeling sad. Patient reports this week has been harder as she has been handling the bank estate of her daughter. Patient reports awareness of episodes of sudden sadness and crying as she thinks about her daughter. Patient reports she has been responding to this by staying in bed and isolating self from other.   Patient responded to the exercise of getting comfortable with telling her daughters story. Patient was able to share since beginning to her last days. Patient noted the hardships her daughter went through going out. LCSW pointed out  despite the hardships her daughter kept it going and this was mostly learned from patient's hardships. Patient reflected on this exercise that is was helpful to learn what her daughters life taught her values, responsibility, and fighting these challenges. Patient recognized this can her motivate on those hard days. Patient denied active SI/HI Progress towards goal is Ongoing. Patient denied active suicidal/homicidal/active psychosis.  Plan Patient offered next appointment for: 10/27 2pm  Diagnosis: Bereavement and Major Depression, Recurrent severe    Lujean Rave, LCSW 09/29/2020

## 2020-09-30 ENCOUNTER — Other Ambulatory Visit: Payer: Medicaid Other | Admitting: Clinical

## 2020-10-02 ENCOUNTER — Other Ambulatory Visit: Payer: Self-pay | Admitting: Internal Medicine

## 2020-10-05 ENCOUNTER — Other Ambulatory Visit: Payer: Medicaid Other | Admitting: Clinical

## 2020-10-12 NOTE — Progress Notes (Signed)
   THERAPY PROGRESS NOTE  Session Time: 3:00-4:00pm Participation Level: Active Behavioral Response: CasualAlertDepressed Type of Therapy: Individual Therapy Treatment Goals addressed: Coping with grief.    Purpose: LCSW met with client for routine individual therapy to work towards treatment goals: coping with grief.   Intervention: LCSW met with patient for routine individual therapy following pause following her daughters grief. For this session LCSW provided supportive counseling as LCSW checked in with patient how she has been coping this last month and what led her to try again counseling to grieve. LCSW discussed potential topics to discuss in the upcoming to help her cope with the passing of her daughter. LCSW assessed for SI/HI/command psychosis.  Effectiveness: Patient is alert x3 affect. Patient identified she was ok. Patient shared she wanted to return to try out therapy because she began to notice depression episodes from not wanting to go out, becoming fearful of something bad happening to her other children, crying spells and isolating. Patient was in agreement with beginning process to grief. Patient shared her daughters accident and response from the hospital. Patient shared she is working with a Chief Executive Officer. Patient reports she misses her daughter dearly and is attempting to be strong but she is struggling to be strong for her kids.  Intervention was effective as patient began to process her grief and will attempt counseling.  Progress towards goal is Ongoing. Patient denied active suicidal/homicidal/active psychosis.  Plan Patient offered next appointment for: 10/12  Diagnosis: Bereavement and Major Depression, Recurrent severe    Lujean Rave, LCSW 10/12/2020

## 2020-10-14 ENCOUNTER — Encounter: Payer: Self-pay | Admitting: Internal Medicine

## 2020-10-14 ENCOUNTER — Ambulatory Visit: Payer: Medicaid Other | Admitting: Internal Medicine

## 2020-10-14 VITALS — BP 137/84 | HR 83 | Resp 16 | Ht 62.0 in | Wt 189.0 lb

## 2020-10-14 DIAGNOSIS — N950 Postmenopausal bleeding: Secondary | ICD-10-CM | POA: Insufficient documentation

## 2020-10-14 DIAGNOSIS — L292 Pruritus vulvae: Secondary | ICD-10-CM

## 2020-10-14 DIAGNOSIS — F332 Major depressive disorder, recurrent severe without psychotic features: Secondary | ICD-10-CM | POA: Diagnosis not present

## 2020-10-14 LAB — POCT WET PREP WITH KOH
Clue Cells Wet Prep HPF POC: NEGATIVE
KOH Prep POC: NEGATIVE
RBC Wet Prep HPF POC: NEGATIVE
Trichomonas, UA: NEGATIVE
Yeast Wet Prep HPF POC: NEGATIVE

## 2020-10-14 MED ORDER — BUPROPION HCL ER (XL) 150 MG PO TB24: ORAL_TABLET | ORAL | 2 refills | Status: DC

## 2020-10-14 NOTE — Patient Instructions (Signed)
Recommend Vagisil or [employer] Anti itch cream to external area where itching. Change pantiliners frequently throughout the day or do not use

## 2020-10-14 NOTE — Progress Notes (Signed)
    Subjective:    Patient ID: [name], female   DOB: 07/09/1971, 49 y.o.   MRN: [id number]   HPI   1.  Vaginal itching:  No odor, burning, or discharge.  Itching is only in the external vulvar area and perhaps even a broader perimeter.  States the itching is continuing from when last seen for BV--which was 09/05/20. No rash, soaps or lotions. Last use of Metrogel for BV was 2 weeks ago. No intercourse since seen in September.  GC/chlamydia negative then. Later, she gives information regarding periods and use of pantiliners or pads daily.   2.  Vaginal bleeding:  States started a few days after received her Moderna vaccine on 09/05/20.  Patient states she has not had a period since turning 49 yo a year ago.   Had heavy flow initially, would slow down then return heavy.  She has not had heavy flow for the past week. States she is having some bleeding daily.   She does have cramping with blood flow if heavy.   Generally, just using a pantiliners daily.  Obtained from [facility], off brand.    3.  Depression:  Deep worsening since daughter's death.  She is hardly getting out of bed.  Last thought of suicide last week.  No definite plan, though would likely overdose.  Denies having pills saved to do so.   Taking Fluoxetine 40 mg daily and Trazodone at bedtime  4.  Heart health:  Has stopped bp and cholesterol meds since daughter's death.    Current Meds  Medication Sig  . acetaminophen (TYLENOL) 500 MG tablet Take 500 mg by mouth every 6 (six) hours as needed.  . dicyclomine (BENTYL) 20 MG tablet TAKE 1 TABLET BY MOUTH EVERY 6 HOURS BEFORE MEALS AS NEEDED  . FLUoxetine (PROZAC) 40 MG capsule Take 1 capsule by mouth once daily  . fluticasone (FLONASE) 50 MCG/ACT nasal spray Use 2 spray(s) in each nostril once daily  . Multiple Vitamin (MULTIVITAMIN WITH MINERALS) TABS Take 1 tablet by mouth daily.  . traZODone (DESYREL) 50 MG tablet TAKE 1 TABLET BY MOUTH AT BEDTIME   No  Known Allergies   Review of Systems    Objective:   BP 137/84 (BP Location: Left Arm, Patient Position: Sitting, Cuff Size: Normal)   Pulse 83   Resp 16   Ht 5\' 2"  (1.575 m)   Wt 189 lb (85.7 kg)   BMI 34.57 kg/m   Physical Exam Depressed affect Lungs:  CTA CV:  RRR without murmur or rub Abd:  S, NT, No HSM or mass, + BS GU:  Normal external genitalia.  No erythema or rash.  Scant white discharge.  Small amount of dark blood near cervix.  No uterine or adnexal mass or tenderness. Wet prep unremarkable.   Assessment & Plan  1.  Vulvar itching:  No obvious cause.  Suspect due to chronic use of pads and pantiliners, but no abnormal skin findings currently Recommend changing liners frequently and perhaps using anti itch hydrating lotion to mons after bathing daily  2.  Postmenopausal bleeding:  Pelvic ultrasound. Discussed Moderna vaccine likely not cause of this.  3.  Depression:  Worsening.  Add lower dose of Buproprion XL 50 mg daily in the morning.  May need to decrease Fluoxetine dose with time.  Follow up in 1 week.   She agrees to call if suicidal intent.

## 2020-10-24 ENCOUNTER — Ambulatory Visit
Admission: RE | Admit: 2020-10-24 | Discharge: 2020-10-24 | Disposition: A | Discharge status: Home / Self Care | Payer: Medicaid Other | Source: Ambulatory Visit | Attending: Internal Medicine | Admitting: Internal Medicine

## 2020-10-24 DIAGNOSIS — N950 Postmenopausal bleeding: Secondary | ICD-10-CM

## 2020-10-26 ENCOUNTER — Encounter: Payer: Self-pay | Admitting: Internal Medicine

## 2020-10-26 ENCOUNTER — Ambulatory Visit: Payer: Medicaid Other | Admitting: Internal Medicine

## 2020-10-26 VITALS — BP 132/88 | HR 88 | Resp 16 | Ht 62.0 in | Wt 189.5 lb

## 2020-10-26 DIAGNOSIS — Z23 Encounter for immunization: Secondary | ICD-10-CM | POA: Diagnosis not present

## 2020-10-26 DIAGNOSIS — N950 Postmenopausal bleeding: Secondary | ICD-10-CM

## 2020-10-26 DIAGNOSIS — F332 Major depressive disorder, recurrent severe without psychotic features: Secondary | ICD-10-CM

## 2020-10-26 NOTE — Progress Notes (Signed)
    Subjective:    Patient ID: [name], female   DOB: 09-Jul-1971, 49 y.o.   MRN: [id number]   HPI   1.  Postmenopausal bleeding:  Still with spotting of dark blood regularly.  Had pelvic ultrasound 2 days ago.  See visit regarding this 10/14/20.  U/S :  IMPRESSION: Fibroid uterus  Heterogeneity of the myometrium which is nonspecific in a reportedly postmenopausal female. Appearance is grossly stable from prior and could reflect sequela of remote adenomyosis, as this process would not occur de novo in a postmenopausal female.  Visible portions of the endometrium are non thickened however a small volume of fluid is seen within the endometrial canal in the lower uterine segment and the fundal endometrium is partially obscured by uterine fibroid and shadowing. Given the setting of postmenopausal bleeding, may warrant hysterosonography and/or endometrial sampling for further evaluation  2.  Depression:  Added Bupropion and patient started on 10/15/2020.  No side effects other than a bit more hungry, which is surprising.  Feels a little more energetic since starting.   Still with no motivation to start counseling back again. She might be more willing to do so virtually.   Still with thoughts of wondering what it would be like if she were no longer living, but no thoughts of actively harming herself.    3.  BP:  Still in high normal range.  Not clear if she is being physically active.  Still little interest in doing anything.    4.  COVID vaccination:  Still not interested in getting her second vaccine.  Feels this was the cause of uterine bleeding.  Current Meds  Medication Sig  . buPROPion (WELLBUTRIN XL) 150 MG 24 hr tablet 1 tab by mouth daily in morning.  . dicyclomine (BENTYL) 20 MG tablet TAKE 1 TABLET BY MOUTH EVERY 6 HOURS BEFORE MEALS AS NEEDED  . FLUoxetine (PROZAC) 40 MG capsule Take 1 capsule by mouth once daily  . fluticasone (FLONASE) 50 MCG/ACT nasal spray  Use 2 spray(s) in each nostril once daily  . Multiple Vitamin (MULTIVITAMIN WITH MINERALS) TABS Take 1 tablet by mouth daily.  . traZODone (DESYREL) 50 MG tablet TAKE 1 TABLET BY MOUTH AT BEDTIME   No Known Allergies   Review of Systems    Objective:   BP 132/88 (BP Location: Right Arm, Patient Position: Sitting, Cuff Size: Normal)   Pulse 88   Resp 16   Ht 5\' 2"  (1.575 m)   Wt 189 lb 8 oz (86 kg)   LMP 09/25/2020 (Approximate)   BMI 34.66 kg/m   Physical Exam  Depressed mood Lungs:  CTA CV:  RRR without murmur or rub.  Radial and DP pulses normal and equal. Abd:  S, NT, No HSM or mass, + BS LE:  No edema   Assessment & Plan  1.  Postmenopausal bleeding:  With unclear findings on pelvic ultrasound, will refer to OB/GYN for further evaluation.  2.  Depression:  Possible mild improvement.  Not interested in counseling with Dwan Bolt or grief counseling with Hospice at this time.  Continues with bupropion addition to treatment  3.  Hypertension:  Improved control  4.  HM:  Holding on Covid vaccination completion.  She feels cause of uterine bleeding and refuses.  Flu vaccine today, however.

## 2020-12-23 ENCOUNTER — Telehealth: Payer: Self-pay | Admitting: Internal Medicine

## 2020-12-23 NOTE — Telephone Encounter (Signed)
Patient called to let you know that the women's clinic you referred her to is temporary closed and she would like to know if you want her to wait or if you will be sending her referral to another place.

## 2020-12-28 ENCOUNTER — Ambulatory Visit: Payer: Medicaid Other | Admitting: Internal Medicine

## 2021-01-05 NOTE — Telephone Encounter (Signed)
[name]--can you call the clinic I sent the referral to?  It as to go to the [facility] at [facility] also see folks at the new facility on [address].   Should not be closed.

## 2021-01-10 ENCOUNTER — Encounter: Payer: Medicaid Other | Admitting: Internal Medicine

## 2021-01-10 NOTE — Telephone Encounter (Signed)
[facility] ans Spoke with [employer]. [name] will be calling the patient to set up an appointment. Called patient and left a message letting her know that she will received a call from the provider to schedule her appointment.

## 2021-01-27 ENCOUNTER — Telehealth: Payer: Self-pay | Admitting: Internal Medicine

## 2021-01-27 DIAGNOSIS — N898 Other specified noninflammatory disorders of vagina: Secondary | ICD-10-CM

## 2021-01-27 NOTE — Telephone Encounter (Signed)
Patient called requesting a refill of metroNIDAZOLE (METROGEL) 0.75 % vaginal gel. Patient stated that she is been having vaginal odor, discharge, and burning when urinating. Patient stated that symptoms started two week ago.

## 2021-01-31 MED ORDER — METRONIDAZOLE 0.75 % VA GEL: VAGINAL | 6 refills | Status: DC

## 2021-01-31 NOTE — Telephone Encounter (Signed)
Patient was informed of new prescription and Doctor instructions.

## 2021-01-31 NOTE — Addendum Note (Signed)
Addended by: Marcelino Duster on: 01/31/2021 03:13 PM   Modules accepted: Orders

## 2021-02-10 ENCOUNTER — Telehealth: Payer: Self-pay | Admitting: Lactation Services

## 2021-02-10 ENCOUNTER — Encounter: Payer: Medicaid Other | Admitting: Family Medicine

## 2021-02-10 ENCOUNTER — Encounter: Payer: Self-pay | Admitting: Family Medicine

## 2021-02-10 NOTE — Progress Notes (Unsigned)
Patient did not keep appointment today. She may call to reschedule.  

## 2021-02-10 NOTE — Telephone Encounter (Signed)
Patient called and LM that she was not feeling well this morning and she needs to reschedule her appointment. Message to front desk to call and reschedule appt.

## 2021-03-04 NOTE — Progress Notes (Signed)
Please call patient to encourage her to get back with gynecology to evaluate her postmenopausal bleeding--feel this is very important.

## 2021-03-10 ENCOUNTER — Telehealth: Payer: Self-pay | Admitting: Internal Medicine

## 2021-03-10 NOTE — Progress Notes (Signed)
Spoke with patient and she stated that she already rescheduled her appointment for 03/15/2021. Patient verbally expressed her understanding of the importance to attend her appointment.

## 2021-03-10 NOTE — Telephone Encounter (Signed)
Patient called requesting a refill for FLUoxetine (PROZAC) 40 MG capsule and traZODone (DESYREL) 50 MG tablet. Patient continue using [facility] at [facility].

## 2021-03-10 NOTE — Progress Notes (Signed)
Called patient and left a message asking to call back.

## 2021-03-13 MED ORDER — TRAZODONE HCL 50 MG PO TABS: 50.0000 mg | ORAL_TABLET | Freq: Every day | ORAL | 9 refills | Status: DC

## 2021-03-13 MED ORDER — FLUOXETINE HCL 40 MG PO CAPS: 40.0000 mg | ORAL_CAPSULE | Freq: Every day | ORAL | 9 refills | Status: DC

## 2021-03-15 ENCOUNTER — Other Ambulatory Visit (HOSPITAL_COMMUNITY)
Admission: RE | Admit: 2021-03-15 | Discharge: 2021-03-15 | Disposition: A | Discharge status: Home / Self Care | Payer: Medicaid Other | Source: Ambulatory Visit | Attending: Family Medicine | Admitting: Family Medicine

## 2021-03-15 ENCOUNTER — Other Ambulatory Visit: Payer: Self-pay

## 2021-03-15 ENCOUNTER — Encounter: Payer: Self-pay | Admitting: Family Medicine

## 2021-03-15 ENCOUNTER — Ambulatory Visit (INDEPENDENT_AMBULATORY_CARE_PROVIDER_SITE_OTHER): Payer: Medicaid Other | Admitting: Family Medicine

## 2021-03-15 VITALS — BP 143/80 | HR 82 | Wt 185.0 lb

## 2021-03-15 DIAGNOSIS — Z124 Encounter for screening for malignant neoplasm of cervix: Secondary | ICD-10-CM

## 2021-03-15 DIAGNOSIS — N950 Postmenopausal bleeding: Secondary | ICD-10-CM | POA: Diagnosis not present

## 2021-03-15 MED ORDER — MISOPROSTOL 200 MCG PO TABS: ORAL_TABLET | ORAL | 2 refills | Status: DC

## 2021-03-15 NOTE — Patient Instructions (Signed)
Endometrial Biopsy  An endometrial biopsy is a procedure to remove tissue samples from the endometrium, which is the lining of the uterus. The tissue that is removed can then be checked under a microscope for disease. This procedure is used to diagnose conditions such as endometrial cancer, endometrial tuberculosis, polyps, or other inflammatory conditions. This procedure may also be used to investigate uterine bleeding to determine where you are in your menstrual cycle or how your hormone levels are affecting the lining of the uterus. Tell a health care provider about:  Any allergies you have.  All medicines you are taking, including vitamins, herbs, eye drops, creams, and over-the-counter medicines.  Any problems you or family members have had with anesthetic medicines.  Any blood disorders you have.  Any surgeries you have had.  Any medical conditions you have.  Whether you are pregnant or may be pregnant. What are the risks? Generally, this is a safe procedure. However, problems may occur, including:  Bleeding.  Pelvic infection.  Puncture of the wall of the uterus with the biopsy device (rare).  Allergic reactions to medicines. What happens before the procedure?  Keep a record of your menstrual cycles as told by your health care provider. You may need to schedule your procedure for a specific time in your cycle.  You may want to bring a sanitary pad to wear after the procedure.  Plan to have someone take you home from the hospital or clinic.  Ask your health care provider about: ? Changing or stopping your regular medicines. This is especially important if you are taking diabetes medicines, arthritis medicines, or blood thinners. ? Taking medicines such as aspirin and ibuprofen. These medicines can thin your blood. Do not take these medicines unless your health care provider tells you to take them. ? Taking over-the-counter medicines, vitamins, herbs, and  supplements. What happens during the procedure?  You will lie on an exam table with your feet and legs supported as in a pelvic exam.  Your health care provider will insert an instrument (speculum) into your vagina to see your cervix.  Your cervix will be cleansed with an antiseptic solution.  A medicine (local anesthetic) will be used to numb the cervix.  A forceps instrument (tenaculum) will be used to hold your cervix steady for the biopsy.  A thin, rod-like instrument (uterine sound) will be inserted through your cervix to determine the length of your uterus and the location where the biopsy sample will be removed.  A thin, flexible tube (catheter) will be inserted through your cervix and into the uterus. The catheter will be used to collect the biopsy sample from your endometrial tissue.  The catheter and speculum will then be removed, and the tissue sample will be sent to a lab for examination. The procedure may vary among health care providers and hospitals. What can I expect after procedure?  You will rest in a recovery area until you are ready to go home.  You may have mild cramping and a small amount of vaginal bleeding. This is normal.  You may have a small amount of vaginal bleeding for a few days. This is normal.  It is up to you to get the results of your procedure. Ask your health care provider, or the department that is doing the procedure, when your results will be ready. Follow these instructions at home:  Take over-the-counter and prescription medicines only as told by your health care provider.  Do not douche, use tampons, or have   sexual intercourse until your health care provider approves.  Return to your normal activities as told by your health care provider. Ask your health care provider what activities are safe for you.  Follow instructions from your health care provider about any activity restrictions, such as restrictions on strenuous exercise or heavy  lifting.  Keep all follow-up visits. This is important. Contact a health care provider:  You have heavy bleeding, or bleed for longer than 2 days after the procedure.  You have bad smelling discharge from your vagina.  You have a fever or chills.  You have a burning sensation when urinating or you have difficulty urinating.  You have severe pain in your lower abdomen. Get help right away if you:  You have severe cramps in your stomach or back.  You pass large blood clots.  Your bleeding increases.  You become weak or light-headed, or you faint or lose consciousness. Summary  An endometrial biopsy is a procedure to remove tissue samples is taken from the endometrium, which is the lining of the uterus.  The tissue sample that is removed will be checked under a microscope for disease.  This procedure is used to diagnose conditions such as endometrial cancer, endometrial tuberculosis, polyps, or other inflammatory conditions.  After the procedure, it is common to have mild cramping and a small amount of vaginal bleeding for a few days.  Do not douche, use tampons, or have sexual intercourse until your health care provider approves. Ask your health care provider which activities are safe for you. This information is not intended to replace advice given to you by your health care provider. Make sure you discuss any questions you have with your health care provider. Document Revised: 06/20/2020 Document Reviewed: 06/20/2020 Elsevier Patient Education  2021 Elsevier Inc.  

## 2021-03-15 NOTE — Progress Notes (Signed)
   Subjective:    Patient ID: [name] is a 50 y.o. female presenting with postmenopasual bleeding  on 03/15/2021  HPI: Patient is referred in for PMB. Has h/o Novasure ablation and no cycle since that time. Received her Moderna COVID vaccine and had bleeding. Had pelvic sonogram to reveal 48mm endometrial stripe. Minimal bleeding since then. Has been having some hot flashes.  Review of Systems  Constitutional: Negative for chills and fever.  Respiratory: Negative for shortness of breath.   Cardiovascular: Negative for chest pain.  Gastrointestinal: Negative for abdominal pain, nausea and vomiting.  Genitourinary: Negative for dysuria.  Skin: Negative for rash.      Objective:    BP (!) 143/80   Pulse 82   Wt 185 lb (83.9 kg)   BMI 33.84 kg/m  Physical Exam Constitutional:      General: She is not in acute distress.    Appearance: She is well-developed.  HENT:     Head: Normocephalic and atraumatic.  Eyes:     General: No scleral icterus. Cardiovascular:     Rate and Rhythm: Normal rate.  Pulmonary:     Effort: Pulmonary effort is normal.  Abdominal:     Palpations: Abdomen is soft.  Musculoskeletal:     Cervical back: Neck supple.  Skin:    General: Skin is warm and dry.  Neurological:     Mental Status: She is alert and oriented to person, place, and time.    Procedure: Patient given informed consent, signed copy in the chart, time out was performed. Appropriate time out taken. . The patient was placed in the lithotomy position and the cervix brought into view with sterile speculum.  Portio of cervix cleansed x 2 with betadine swabs.  A tenaculum was placed in the anterior lip of the cervix.  Attempt to penetrate the cervix was unsuccessful. An os finder was used and this was unsuccessful. The patient could not tolerate further attempts at EMB. All equipment was removed and accounted for.  The patient tolerated the procedure well.       Assessment & Plan:    Problem List Items Addressed This Visit      Unprioritized   Postmenopausal bleeding - Primary    Attempts at EMB were not successful in the office today due to cervical stenosis and discomfort. Will attempt again with cytotec prior to procedure.  She is likely getting to menopause given hot flashes, though this is not confirmed. Can check FSH. Could be re-growth of endometrial tissues following ablation.      Relevant Orders   Surgical pathology( [place]/ [facility])    Other Visit Diagnoses    Screening for cervical cancer       Relevant Orders   Cytology - P[facility]( [facility])      Total time in review of prior notes, pathology, labs, history taking, review with patient, exam, note writing, discussion of options, plan for next steps, alternatives and risks of treatment: 25 minutes.  Return in about 4 weeks (around 04/12/2021) for in person atempt at endometrial biopsy again.  Donnamae Jude 03/16/2021 10:05 AM

## 2021-03-15 NOTE — Assessment & Plan Note (Signed)
Attempts at EMB were not successful in the office today due to cervical stenosis and discomfort. Will attempt again with cytotec prior to procedure.  She is likely getting to menopause given hot flashes, though this is not confirmed. Can check FSH. Could be re-growth of endometrial tissues following ablation.

## 2021-03-16 ENCOUNTER — Encounter: Payer: Self-pay | Admitting: General Practice

## 2021-03-16 ENCOUNTER — Encounter: Payer: Self-pay | Admitting: Family Medicine

## 2021-03-16 ENCOUNTER — Telehealth: Payer: Self-pay | Admitting: Internal Medicine

## 2021-03-16 LAB — CYTOLOGY - PAP
Comment: NEGATIVE
Diagnosis: NEGATIVE
High risk HPV: NEGATIVE

## 2021-03-16 NOTE — Telephone Encounter (Signed)
Patient was notified.

## 2021-03-16 NOTE — Telephone Encounter (Signed)
Patient called requesting a refill of fluticasone (FLONASE) 50 MCG/ACT nasal spray  Also, patient wants you to know that she went to her appointment with Obstetrics but was unable to complete the procedure because it was too painful for her. Her appointment was reschedule for 04/28/2021

## 2021-03-19 MED ORDER — FLUTICASONE PROPIONATE 50 MCG/ACT NA SUSP: NASAL | 10 refills | Status: DC

## 2021-03-23 NOTE — Telephone Encounter (Signed)
Patient confirmed that she picked up her nasal spay.

## 2021-04-28 ENCOUNTER — Ambulatory Visit: Payer: Medicaid Other | Admitting: Family Medicine

## 2021-05-29 ENCOUNTER — Ambulatory Visit: Payer: Medicaid Other | Admitting: Family Medicine

## 2021-05-29 ENCOUNTER — Encounter: Payer: Self-pay | Admitting: Family Medicine

## 2021-05-29 NOTE — Progress Notes (Signed)
Patient did not keep appointment today. She may call to reschedule.  

## 2021-06-02 ENCOUNTER — Encounter: Payer: Medicaid Other | Admitting: Internal Medicine

## 2021-06-06 ENCOUNTER — Ambulatory Visit (INDEPENDENT_AMBULATORY_CARE_PROVIDER_SITE_OTHER): Payer: Medicaid Other | Admitting: Internal Medicine

## 2021-06-06 ENCOUNTER — Encounter: Payer: Self-pay | Admitting: Internal Medicine

## 2021-06-06 ENCOUNTER — Other Ambulatory Visit: Payer: Self-pay

## 2021-06-06 VITALS — BP 130/80 | HR 80 | Resp 12 | Ht 62.0 in | Wt 186.0 lb

## 2021-06-06 DIAGNOSIS — R110 Nausea: Secondary | ICD-10-CM

## 2021-06-06 DIAGNOSIS — R197 Diarrhea, unspecified: Secondary | ICD-10-CM | POA: Diagnosis not present

## 2021-06-06 MED ORDER — BUPROPION HCL 75 MG PO TABS: ORAL_TABLET | ORAL | 11 refills | Status: DC

## 2021-06-06 NOTE — Patient Instructions (Signed)
1/2 packet Metamucil daily in 8 oz water
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
## 2021-06-07 LAB — CBC WITH DIFFERENTIAL/PLATELET
Basophils Absolute: 0.1 10*3/uL (ref 0.0–0.2)
Basos: 1 %
EOS (ABSOLUTE): 0.2 10*3/uL (ref 0.0–0.4)
Eos: 2 %
Hematocrit: 42.5 % (ref 34.0–46.6)
Hemoglobin: 13.8 g/dL (ref 11.1–15.9)
Immature Grans (Abs): 0 10*3/uL (ref 0.0–0.1)
Immature Granulocytes: 0 %
Lymphocytes Absolute: 1.5 10*3/uL (ref 0.7–3.1)
Lymphs: 21 %
MCH: 31.2 pg (ref 26.6–33.0)
MCHC: 32.5 g/dL (ref 31.5–35.7)
MCV: 96 fL (ref 79–97)
Monocytes Absolute: 0.5 10*3/uL (ref 0.1–0.9)
Monocytes: 7 %
Neutrophils Absolute: 4.8 10*3/uL (ref 1.4–7.0)
Neutrophils: 69 %
Platelets: 268 10*3/uL (ref 150–450)
RBC: 4.43 x10E6/uL (ref 3.77–5.28)
RDW: 13.4 % (ref 11.7–15.4)
WBC: 7 10*3/uL (ref 3.4–10.8)

## 2021-06-07 LAB — COMPREHENSIVE METABOLIC PANEL
ALT: 15 IU/L (ref 0–32)
AST: 14 IU/L (ref 0–40)
Albumin/Globulin Ratio: 1.5 (ref 1.2–2.2)
Albumin: 4.1 g/dL (ref 3.8–4.9)
Alkaline Phosphatase: 91 IU/L (ref 44–121)
BUN/Creatinine Ratio: 15 (ref 9–23)
BUN: 17 mg/dL (ref 6–24)
Bilirubin Total: <0.2 mg/dL (ref 0.0–1.2)
CO2: 25 mmol/L (ref 20–29)
Calcium: 10.2 mg/dL (ref 8.7–10.2)
Chloride: 104 mmol/L (ref 96–106)
Creatinine, Ser: 1.13 mg/dL — ABNORMAL HIGH (ref 0.57–1.00)
Globulin, Total: 2.8 g/dL (ref 1.5–4.5)
Glucose: 81 mg/dL (ref 65–99)
Potassium: 4.5 mmol/L (ref 3.5–5.2)
Sodium: 143 mmol/L (ref 134–144)
Total Protein: 6.9 g/dL (ref 6.0–8.5)
eGFR: 59 mL/min/{1.73_m2} — ABNORMAL LOW (ref >59)

## 2021-06-07 LAB — T4, FREE: Free T4: 1.05 ng/dL (ref 0.82–1.77)

## 2021-06-07 LAB — TSH: TSH: 4 u[IU]/mL (ref 0.450–4.500)

## 2021-08-03 ENCOUNTER — Other Ambulatory Visit: Payer: Self-pay

## 2021-08-03 ENCOUNTER — Encounter: Payer: Self-pay | Admitting: Internal Medicine

## 2021-08-03 ENCOUNTER — Ambulatory Visit (INDEPENDENT_AMBULATORY_CARE_PROVIDER_SITE_OTHER): Payer: Medicaid Other | Admitting: Internal Medicine

## 2021-08-03 VITALS — BP 136/106 | HR 80 | Resp 16 | Ht 64.0 in | Wt 184.0 lb

## 2021-08-03 DIAGNOSIS — Z1211 Encounter for screening for malignant neoplasm of colon: Secondary | ICD-10-CM | POA: Diagnosis not present

## 2021-08-03 DIAGNOSIS — H6981 Other specified disorders of Eustachian tube, right ear: Secondary | ICD-10-CM

## 2021-08-03 DIAGNOSIS — F332 Major depressive disorder, recurrent severe without psychotic features: Secondary | ICD-10-CM

## 2021-08-03 DIAGNOSIS — Z1231 Encounter for screening mammogram for malignant neoplasm of breast: Secondary | ICD-10-CM | POA: Diagnosis not present

## 2021-08-03 DIAGNOSIS — Z0000 Encounter for general adult medical examination without abnormal findings: Secondary | ICD-10-CM | POA: Diagnosis not present

## 2021-08-03 DIAGNOSIS — H6991 Unspecified Eustachian tube disorder, right ear: Secondary | ICD-10-CM

## 2021-08-03 DIAGNOSIS — L720 Epidermal cyst: Secondary | ICD-10-CM

## 2021-08-03 MED ORDER — AMLODIPINE BESYLATE 2.5 MG PO TABS: 2.5000 mg | ORAL_TABLET | Freq: Every day | ORAL | 11 refills | Status: DC

## 2021-08-03 MED ORDER — METOPROLOL SUCCINATE ER 25 MG PO TB24: 25.0000 mg | ORAL_TABLET | Freq: Every day | ORAL | 11 refills | Status: DC

## 2021-08-03 NOTE — Patient Instructions (Signed)
Please call Dr. Virginia Crews office to follow up for endometrial biopsy

## 2021-08-03 NOTE — Progress Notes (Signed)
Subjective:    Patient ID: [name], female   DOB: 10/24/1971, 50 y.o.   MRN: [id number]   HPI  CPE without pap  1.  Pap:  03/15/2021 last and normal.    2.  Mammogram:  Not clear why she is not following through with appt.  States daughter was sick with last attempt and had to cancel.  States she couldn't get through when attempted to reschedule.  No family history of breast cancer.   3.  Osteoprevention:  Past year having hot flashes and night sweats over last year.  Does not drink milk or eat yogurt much.  Eats cheese several times weekly.  Getting out of house 3-4 times weekly.    4.  FIT:  Never this or Guaiac.    5.  Colonoscopy:  Never.  No family history of colon cancer.  Referred to GI with worsening of diarrhea 2 months ago.  Appears she has been called to appoint in referral order, but is not returning the call.  6.  Immunizations:  Only had 1 covid vaccine--pain in arm and fatigue.  Also feels the Moderna caused her uterine bleeding as well.  Never followed up with endometrial biopsy with Dr. Kennon Rounds.  Had cervical stenosis with first attempt and was given vaginal Misoprostol for another attempt, but did not keep appt.  Still has the [place]. Immunization History  Administered Date(s) Administered   Influenza Inj Mdck Quad Pf 11/08/2016   Influenza Inj Mdck Quad With Preservative 10/26/2020   Influenza,inj,Quad PF,6+ Mos 10/19/2013, 02/07/2016   Influenza-Unspecified 10/31/2017, 10/24/2018, 10/28/2019   Moderna Sars-Covid-2 Vaccination 09/05/2020   Tdap 06/08/2016      7.  Glucose/Cholesterol :  Glucose in the past has been fine.  Reportedly with high cholesterol with Dr Terrence Dupont, but nothing in this chart.  Was to be taking Atorvastatin, but did not feel she needed it, so stopped.      Current Meds  Medication Sig   amLODipine (NORVASC) 2.5 MG tablet Take 2.5 mg by mouth daily.   buPROPion (WELLBUTRIN) 75 MG tablet 1 tab by mouth in morning.    FLUoxetine (PROZAC) 40 MG capsule Take 1 capsule (40 mg total) by mouth daily.   fluticasone (FLONASE) 50 MCG/ACT nasal spray Use 2 spray(s) in each nostril once daily   metroNIDAZOLE (METROGEL) 0.75 % vaginal gel 1 applicatorful vaginally twice daily for 5 days then twice weekly for 6 months   Multiple Vitamin (MULTIVITAMIN WITH MINERALS) TABS Take 1 tablet by mouth daily.   nitroGLYCERIN (NITROSTAT) 0.4 MG SL tablet nitroglycerin 0.4 mg sublingual tablet  USE AS DIRECTED   traZODone (DESYREL) 50 MG tablet Take 1 tablet (50 mg total) by mouth at bedtime.   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   DIC in setting SVD with amniotic fluid embolism   History of hypertension 2004   PIH   History of hypothyroidism    per pt yrs ago -- resolved   History of MI (myocardial infarction) 02-05-2003----  resolved   in setting [place] during pregnancy--- SVD with amniotic fluid embolism, DIC, respiratory failure with pulmonary edema (pt was venterd)   Hypertension    Irritable bowel syndrome    Past Surgical History:  Procedure Laterality Date   ANTERIOR CRUCIATE LIGAMENT (  ACL) REVISION Left 07/24/2018   Procedure: Left knee arthroscopy, debridement, allograft revision anterior cruciate ligament reconstruction,  hardware removal deep, partialmedial and lateral  menisectomy;  Surgeon: Sydnee Cabal, MD;  Location: [facility];  Service: Orthopedics;  Laterality: Left;  2 hrs General with adductor canal block   ANTERIOR CRUCIATE LIGAMENT REPAIR Left 04-20-2003    dr Rhona Raider   [place]  07-01-2003   dr Terrence Dupont    [facility]   positive stress cardiolite:  normal coronaries,  LV w/ mild global hypokinesis, ef 50%   CURRETTAGE FOR RETAINED PRODUCTS OF CONCEPTION  11-25-2007      '@[facility]'$     FOR PORTPARTUM HEMORRHAGE   HYSTEROSCOPY WITH NOVASURE N/A 11/27/2013   Procedure: HYSTEROSCOPY WITH NOVASURE;  Surgeon: Lavonia Drafts, MD;  Location: [facility];  Service: Gynecology;  Laterality: N/A;   Left ACL reconstruction Left 08/2018   MCL sprain as well.   POST PARTUM TUBAL LIGATION WITH FILSHIE CLIPS  11-17-2002     dr leggett  [place] ECHOCARDIOGRAM  05/09/2010   ef 55-60%/  trivial MR and TR/  trivial pericardial effusion   ULNAR SHORTENING WITH BONE GRAFT Right 03/28/2017   Procedure: RIGHT ULNA WRIST OSTEOTOMY;  Surgeon: Daryll Brod, MD;  Location: [place];  Service: Orthopedics;  Laterality: Right;   WRIST ARTHROSCOPY WITH FOVEAL TRIANGULAR FIBROCARTILAGE COMPLEX REPAIR Right 09/25/2016   Procedure: Right WRIST ARTHROSCOPY WITH FOVEAL TRIANGULAR FIBROCARTILAGE COMPLEX REPAIR;  Surgeon: Daryll Brod, MD;  Location: [place];  Service: Orthopedics;  Laterality: Right;   Family History  Problem Relation Age of Onset   Hypertension Mother    Arthritis Father    Hypertension Father    Depression Brother    Allergies Son    Diabetes Maternal Aunt    Prostate cancer Paternal Uncle    Stomach cancer Paternal Grandmother    Social History   Socioeconomic History   Marital status: Widowed    Spouse name: Not on file   Number of children: 5   Years of education: Not on file   Highest education level: Some college, no degree  Occupational History   Occupation: Unemployed    Comment: Has not worked since husband killed in August 2016.    Tobacco Use   Smoking status: Former    Years: 0.50    Types: Cigarettes    Quit date: 07/14/1997    Years since quitting: 24.0   Smokeless tobacco: Never  Vaping Use   Vaping Use: Never used  Substance and Sexual Activity   Alcohol use: Yes    Comment: 2 drinks or so on weekend.   Drug use: Not Currently    Types: Marijuana    Comment: Sometimes using   Sexual activity: Yes    Birth  control/protection: Surgical    Comment: novasure  and PPTL  Other Topics Concern   Not on file  Social History Narrative   Lives with 2 youngest children, son and daughter.   Has not worked since estranged husband killed   Does not work.    Depression she feels is what is keeping her from working.   Social Determinants of Health   Financial Resource Strain: Not on file  Food Insecurity: No Food Insecurity   Worried About Charity fundraiser in the Last Year: Never true   Ran Out of Food in the Last Year: Never true  Transportation Needs: No Transportation Needs   Lack  of Transportation (Medical): No   Lack of Transportation (Non-Medical): No  Physical Activity: Not on file  Stress: Not on file  Social Connections: Not on file  Intimate Partner Violence: Not At Risk   Fear of Current or Ex-Partner: No   Emotionally Abused: No   Physically Abused: No   Sexually Abused: No   '      Review of Systems  HENT:  Positive for ear pain (Right ear pain since bronchitis 1 month ago.  Feels clogged.).   Psychiatric/Behavioral:         Wants to know if can come off anti depressant meds.  Still depressed, though perhaps a bit better now she has a boyfriend of 6 months.     Objective:   BP (!) 136/106 (BP Location: Left Arm, Patient Position: Sitting, Cuff Size: Normal)   Pulse 80   Resp 16   Ht '5\' 4"'$  (1.626 m)   Wt 184 lb (83.5 kg)   LMP 09/25/2020 (Approximate)   BMI 31.58 kg/m   Physical Exam HENT:     Head: Normocephalic and atraumatic.     Right Ear: Ear canal and external ear normal.     Left Ear: Tympanic membrane, ear canal and external ear normal.     Ears:     Comments: Right TM dull with mild retraction.    Nose: Nose normal.     Mouth/Throat:     Mouth: Mucous membranes are moist.     Pharynx: Oropharynx is clear.  Eyes:     Extraocular Movements: Extraocular movements intact.     Conjunctiva/sclera: Conjunctivae normal.     Pupils: Pupils are equal,  round, and reactive to light.     Comments: Discs sharp.  Neck:     Thyroid: No thyroid mass or thyromegaly.  Cardiovascular:     Rate and Rhythm: Normal rate and regular rhythm.     Heart sounds: S1 normal and S2 normal. No murmur heard.    No friction rub. No S3 or S4 sounds.     Comments: No carotid bruits.  Carotid, radial, femoral, DP and PT pulses normal and equal.    Pulmonary:     Effort: Pulmonary effort is normal.     Breath sounds: Normal breath sounds.  Chest:  Breasts:    Right: No inverted nipple, mass or nipple discharge.     Left: No inverted nipple, mass or nipple discharge.     Comments: 1 cm open comedone with surrounding scarring at medial right breast Abdominal:     General: Bowel sounds are normal.     Palpations: Abdomen is soft. There is no hepatomegaly, splenomegaly or mass.     Tenderness: There is no abdominal tenderness.     Hernia: No hernia is present.  Genitourinary:    Comments: Normal external female genitalia. No uterine or adnexal mass or tenderness. No discharge. Musculoskeletal:        General: Normal range of motion.     Cervical back: Normal range of motion and neck supple.     Right lower leg: No edema.     Left lower leg: No edema.  Lymphadenopathy:     Head:     Right side of head: No submental or submandibular adenopathy.     Left side of head: No submental or submandibular adenopathy.     Cervical: No cervical adenopathy.     Upper Body:     Right upper body: No supraclavicular or axillary adenopathy.  Left upper body: No supraclavicular or axillary adenopathy.     Lower Body: No right inguinal adenopathy. No left inguinal adenopathy.  Skin:    General: Skin is warm.     Capillary Refill: Capillary refill takes less than 2 seconds.     Findings: No rash.  Neurological:     General: No focal deficit present.     Mental Status: She is alert and oriented to person, place, and time.     Cranial Nerves: Cranial nerves 2-12 are  intact.     Sensory: Sensation is intact.     Motor: Motor function is intact.     Coordination: Coordination is intact.     Gait: Gait is intact.     Deep Tendon Reflexes: Reflexes are normal and symmetric.  Psychiatric:        Mood and Affect: Mood is depressed.        Speech: Speech normal.        Behavior: Behavior normal. Behavior is cooperative.      Assessment & Plan    CPE without pap Reorder mammogram.  Encouraged patient to follow through with care. Reorder GI referral.  As diarrhea now not such and issues, will send for screening coloscopy.  Again, encouraged her to listen to voicemail and reply for referrals. Refuses COVID vaccination. Encouraged Shingrix vaccination--do not currently have available.  2.  Post menopausal bleeding:  never completed follow up with Dr. Kennon Rounds.  Encouraged her to call and get set back up with endometrial biopsy.  She feels due to [place] vaccination, which discussed has seemed to affect women's cycles.    3.  Hypertension:  not controlled add Metoprolol succinate 25 mg daily to Amlodipine.  BP check with FLP and BMP in 2 weeks.    4.  Depression:  Referral to Psychiatry as do not seem to be making headway with her treatment and not obtaining counseling.  Feel this is likely affecting her ability to follow through on her other health issues.  5.  Epidermoid cyst of right breast with scarring and history of keloid formation:  referral to Gen Surgery for removal recommendations.  6.  Eustachian tube dysfunction, right:  discussed should resolve with time.  To call if still a problem in 1 month.

## 2021-08-13 ENCOUNTER — Other Ambulatory Visit: Payer: Self-pay

## 2021-08-13 ENCOUNTER — Emergency Department (HOSPITAL_COMMUNITY)
Admission: EM | Admit: 2021-08-13 | Discharge: 2021-08-13 | Disposition: A | Discharge status: Home / Self Care | Payer: Medicaid Other | Attending: Emergency Medicine | Admitting: Emergency Medicine

## 2021-08-13 ENCOUNTER — Encounter (HOSPITAL_COMMUNITY): Payer: Self-pay | Admitting: Emergency Medicine

## 2021-08-13 DIAGNOSIS — Y92009 Unspecified place in unspecified non-institutional (private) residence as the place of occurrence of the external cause: Secondary | ICD-10-CM | POA: Insufficient documentation

## 2021-08-13 DIAGNOSIS — Z23 Encounter for immunization: Secondary | ICD-10-CM | POA: Insufficient documentation

## 2021-08-13 DIAGNOSIS — Z87891 Personal history of nicotine dependence: Secondary | ICD-10-CM | POA: Insufficient documentation

## 2021-08-13 DIAGNOSIS — S0181XA Laceration without foreign body of other part of head, initial encounter: Secondary | ICD-10-CM | POA: Insufficient documentation

## 2021-08-13 DIAGNOSIS — Z79899 Other long term (current) drug therapy: Secondary | ICD-10-CM | POA: Diagnosis not present

## 2021-08-13 DIAGNOSIS — I10 Essential (primary) hypertension: Secondary | ICD-10-CM | POA: Diagnosis not present

## 2021-08-13 DIAGNOSIS — E039 Hypothyroidism, unspecified: Secondary | ICD-10-CM | POA: Insufficient documentation

## 2021-08-13 DIAGNOSIS — W500XXA Accidental hit or strike by another person, initial encounter: Secondary | ICD-10-CM | POA: Insufficient documentation

## 2021-08-13 MED ORDER — LIDOCAINE-EPINEPHRINE (PF) 2 %-1:200000 IJ SOLN
10.0000 mL | Freq: Once | INTRAMUSCULAR | Status: AC
  Administered 2021-08-13: 10 mL
  Filled 2021-08-13: qty 20

## 2021-08-13 MED ORDER — TETANUS-DIPHTH-ACELL PERTUSSIS 5-2.5-18.5 LF-MCG/0.5 IM SUSY
0.5000 mL | PREFILLED_SYRINGE | Freq: Once | INTRAMUSCULAR | Status: AC
  Administered 2021-08-13: 0.5 mL via INTRAMUSCULAR
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
## 2021-08-13 NOTE — ED Notes (Signed)
Discharged from triage by PA.
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
## 2021-08-13 NOTE — Discharge Instructions (Signed)
Keep wound clean and dry. Recheck with your doctor as discussed.

## 2021-08-17 ENCOUNTER — Other Ambulatory Visit: Payer: Self-pay

## 2021-08-17 ENCOUNTER — Other Ambulatory Visit (INDEPENDENT_AMBULATORY_CARE_PROVIDER_SITE_OTHER): Payer: Medicaid Other

## 2021-08-17 VITALS — BP 130/90 | HR 60

## 2021-08-17 DIAGNOSIS — Z13220 Encounter for screening for lipoid disorders: Secondary | ICD-10-CM | POA: Diagnosis not present

## 2021-08-17 DIAGNOSIS — Z0130 Encounter for examination of blood pressure without abnormal findings: Secondary | ICD-10-CM

## 2021-08-17 DIAGNOSIS — Z79899 Other long term (current) drug therapy: Secondary | ICD-10-CM | POA: Diagnosis not present

## 2021-08-18 LAB — BASIC METABOLIC PANEL
BUN/Creatinine Ratio: 13 (ref 9–23)
BUN: 14 mg/dL (ref 6–24)
CO2: 24 mmol/L (ref 20–29)
Calcium: 8.9 mg/dL (ref 8.7–10.2)
Chloride: 107 mmol/L — ABNORMAL HIGH (ref 96–106)
Creatinine, Ser: 1.04 mg/dL — ABNORMAL HIGH (ref 0.57–1.00)
Glucose: 94 mg/dL (ref 65–99)
Potassium: 3.9 mmol/L (ref 3.5–5.2)
Sodium: 141 mmol/L (ref 134–144)
eGFR: 65 mL/min/{1.73_m2} (ref >59)

## 2021-08-18 LAB — LIPID PANEL W/O CHOL/HDL RATIO
Cholesterol, Total: 169 mg/dL (ref 100–199)
HDL: 51 mg/dL (ref >39)
LDL Chol Calc (NIH): 104 mg/dL — ABNORMAL HIGH (ref 0–99)
Triglycerides: 75 mg/dL (ref 0–149)
VLDL Cholesterol Cal: 14 mg/dL (ref 5–40)

## 2021-08-28 ENCOUNTER — Ambulatory Visit (INDEPENDENT_AMBULATORY_CARE_PROVIDER_SITE_OTHER): Payer: Medicaid Other | Admitting: Family Medicine

## 2021-08-28 ENCOUNTER — Other Ambulatory Visit: Payer: Self-pay

## 2021-08-28 ENCOUNTER — Encounter: Payer: Self-pay | Admitting: Family Medicine

## 2021-08-28 VITALS — BP 137/76 | HR 68 | Wt 182.0 lb

## 2021-08-28 DIAGNOSIS — Z23 Encounter for immunization: Secondary | ICD-10-CM

## 2021-08-28 DIAGNOSIS — N950 Postmenopausal bleeding: Secondary | ICD-10-CM | POA: Diagnosis not present

## 2021-08-28 NOTE — Progress Notes (Signed)
   Subjective:    Patient ID: [name] is a 50 y.o. female presenting with endometrial biopsy  on 08/28/2021  HPI: Seen here initially for PMB in April. Attempts at endometrial sampling were not completed due to cervical stenosis. She returns today for re-attempt. She has taken her cytotec. She has not had further bleeding since last year, following a [place] Covid vaccine.   Review of Systems  Constitutional:  Negative for chills and fever.  Respiratory:  Negative for shortness of breath.   Cardiovascular:  Negative for chest pain.  Gastrointestinal:  Negative for abdominal pain, nausea and vomiting.  Genitourinary:  Negative for dysuria.  Skin:  Negative for rash.     Objective:    BP 137/76   Pulse 68   Wt 182 lb (82.6 kg)   BMI 31.24 kg/m  Physical Exam Constitutional:      General: She is not in acute distress.    Appearance: She is well-developed.  HENT:     Head: Normocephalic and atraumatic.  Eyes:     General: No scleral icterus. Cardiovascular:     Rate and Rhythm: Normal rate.  Pulmonary:     Effort: Pulmonary effort is normal.  Abdominal:     Palpations: Abdomen is soft.  Musculoskeletal:     Cervical back: Neck supple.  Skin:    General: Skin is warm and dry.  Neurological:     Mental Status: She is alert and oriented to person, place, and time.   Procedure: Patient given informed consent, signed copy in the chart, time out was performed. Appropriate time out taken. The patient was placed in the lithotomy position and the cervix brought into view with sterile speculum.  Portio of cervix cleansed x 2 with betadine swabs.  A tenaculum was placed in the anterior lip of the cervix. The cervix is stenotic and neither the pipelle nor os finder could be passed. Patient was quite uncomfortable with the procedure.    Assessment & Plan:   Problem List Items Addressed This Visit       Unprioritized   Postmenopausal bleeding    No longer bleeding. U/S  suggests thin stripe. Has failed 2 attempts in the office--if bleeding returns, would book for D & C in the OR.      Other Visit Diagnoses     Need for immunization against influenza    -  Primary   Relevant Orders   Flu Vaccine QUAD 7moIM (Fluarix, Fluzone & Alfiuria Quad PF) (Completed)        Return if symptoms worsen or fail to improve.  TDonnamae Jude9/19/2022 3:29 PM

## 2021-08-28 NOTE — Assessment & Plan Note (Signed)
No longer bleeding. U/S suggests thin stripe. Has failed 2 attempts in the office--if bleeding returns, would book for D & C in the OR.

## 2021-08-29 NOTE — Progress Notes (Signed)
Diastolic BP actually not at goal.  Would like to have her back in 1 month for recheck of BP only--nurse visit.

## 2021-08-29 NOTE — Progress Notes (Signed)
Patients bp was within range. No questions or concerns regarding medication

## 2021-09-09 ENCOUNTER — Encounter: Payer: Self-pay | Admitting: Radiology

## 2021-11-16 ENCOUNTER — Other Ambulatory Visit: Payer: Medicaid Other

## 2021-11-16 ENCOUNTER — Other Ambulatory Visit: Payer: Self-pay

## 2021-11-16 DIAGNOSIS — R7989 Other specified abnormal findings of blood chemistry: Secondary | ICD-10-CM

## 2021-11-17 LAB — URINALYSIS, MICROSCOPIC ONLY
Epithelial Cells (non renal): >10 /hpf — AB (ref 0–10)
RBC, Urine: NONE SEEN /hpf (ref 0–2)

## 2021-11-20 ENCOUNTER — Encounter: Payer: Self-pay | Admitting: Physician Assistant

## 2021-11-20 ENCOUNTER — Ambulatory Visit (INDEPENDENT_AMBULATORY_CARE_PROVIDER_SITE_OTHER): Payer: Medicaid Other | Admitting: Physician Assistant

## 2021-11-20 VITALS — BP 124/86 | HR 68 | Ht 64.0 in | Wt 182.4 lb

## 2021-11-20 DIAGNOSIS — Z1211 Encounter for screening for malignant neoplasm of colon: Secondary | ICD-10-CM | POA: Diagnosis not present

## 2021-11-20 DIAGNOSIS — R1084 Generalized abdominal pain: Secondary | ICD-10-CM

## 2021-11-20 DIAGNOSIS — R194 Change in bowel habit: Secondary | ICD-10-CM

## 2021-11-20 DIAGNOSIS — K219 Gastro-esophageal reflux disease without esophagitis: Secondary | ICD-10-CM | POA: Diagnosis not present

## 2021-11-20 MED ORDER — DICYCLOMINE HCL 10 MG PO CAPS: 10.0000 mg | ORAL_CAPSULE | Freq: Three times a day (TID) | ORAL | 5 refills | Status: DC

## 2021-11-20 MED ORDER — OMEPRAZOLE 40 MG PO CPDR: 40.0000 mg | DELAYED_RELEASE_CAPSULE | Freq: Every day | ORAL | 5 refills | Status: DC

## 2021-11-20 NOTE — Progress Notes (Signed)
Chief Complaint: Chronic diarrhea and reflux  HPI:    [name] is a 50 year old female with a past medical history of depression, IBS and others listed below, known to Dr. Carlean Purl, who was referred to me by Mack Hook, MD for a complaint of diarrhea.    03/20/2012 EGD for left upper quadrant pain with erosions in the antrum and otherwise normal.  Patient started on Omeprazole 20 mg daily and given Amitriptyline 25 mg at bedtime for chronic left upper quadrant pain in the setting of chronic headaches and insomnia.  Pathology showed reactive gastropathy and patient was continued on Omeprazole and Amitriptyline.    06/06/2021 CBC, TSH and T4 normal.  CMP with an elevated creatinine at 1.13 and otherwise normal.    Today, the patient presents to clinic and tells me that she constantly radiates between constipation and diarrhea but over the past few months she has had an increase in frequency of her bowel movements telling me that they often wake her up in the middle of the night and she will have loose diarrheal stools.  Along with this has a lot of generalized abdominal cramping seemingly worse in the lower abdomen which is sometimes better after a bowel movement and other times not.  Tells me she was diagnosed with IBS back in 2000 and "I am not sure if this is still it".     Also discusses almost constant heartburn and reflux for which she takes over-the-counter products which do not seem to help.  Remembers being on Omeprazole in the past but stopped this a long time ago.    Does describe a lot of stress and anxiety, apparently her daughter passed away about a year ago.    Denies fever, chills, weight loss or blood in her stool.  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   DIC in setting SVD with amniotic fluid embolism    History of hypertension 2004   PIH   History of hypothyroidism    per pt yrs ago -- resolved   History of MI (myocardial infarction) 02-05-2003----  resolved   in setting PIH during pregnancy--- SVD with amniotic fluid embolism, DIC, respiratory failure with pulmonary edema (pt was venterd)   Hypertension    Irritable bowel syndrome     Past Surgical History:  Procedure Laterality Date   ANTERIOR CRUCIATE LIGAMENT (ACL) REVISION Left 07/24/2018   Procedure: Left knee arthroscopy, debridement, allograft revision anterior cruciate ligament reconstruction,  hardware removal deep, partialmedial and lateral  menisectomy;  Surgeon: Sydnee Cabal, MD;  Location: [facility];  Service: Orthopedics;  Laterality: Left;  2 hrs General with adductor canal block   ANTERIOR CRUCIATE LIGAMENT REPAIR Left 04-20-2003    dr Rhona Raider   [place]  07-01-2003   dr Terrence Dupont    [facility]   positive stress cardiolite:  normal coronaries,  LV w/ mild global hypokinesis, ef 50%   CURRETTAGE FOR RETAINED PRODUCTS OF CONCEPTION  11-25-2007      @[facility]    FOR PORTPARTUM HEMORRHAGE   HYSTEROSCOPY WITH NOVASURE N/A 11/27/2013   Procedure: HYSTEROSCOPY WITH NOVASURE;  Surgeon: Lavonia Drafts, MD;  Location: [facility];  Service: Gynecology;  Laterality: N/A;   Left ACL reconstruction Left 08/2018   MCL sprain as well.   POST PARTUM TUBAL  LIGATION WITH FILSHIE CLIPS  11-17-2002     dr leggett  [place] ECHOCARDIOGRAM  05/09/2010   ef 55-60%/  trivial MR and TR/  trivial pericardial effusion   ULNAR SHORTENING WITH BONE GRAFT Right 03/28/2017   Procedure: RIGHT ULNA WRIST OSTEOTOMY;  Surgeon: Daryll Brod, MD;  Location: [facility];  Service: Orthopedics;  Laterality: Right;   WRIST ARTHROSCOPY WITH FOVEAL TRIANGULAR FIBROCARTILAGE COMPLEX REPAIR Right 09/25/2016   Procedure: Right WRIST ARTHROSCOPY WITH FOVEAL TRIANGULAR FIBROCARTILAGE COMPLEX REPAIR;  Surgeon:  Daryll Brod, MD;  Location: [facility];  Service: Orthopedics;  Laterality: Right;    Current Outpatient Medications  Medication Sig Dispense Refill   acetaminophen (TYLENOL) 500 MG tablet Take 500 mg by mouth every 6 (six) hours as needed. (Patient not taking: No sig reported)     FLUoxetine (PROZAC) 40 MG capsule Take 1 capsule (40 mg total) by mouth daily. 30 capsule 9   fluticasone (FLONASE) 50 MCG/ACT nasal spray Use 2 spray(s) in each nostril once daily 16 g 10   metoprolol succinate (TOPROL-XL) 25 MG 24 hr tablet Take 1 tablet (25 mg total) by mouth daily. 30 tablet 11   metroNIDAZOLE (METROGEL) 0.75 % vaginal gel 1 applicatorful vaginally twice daily for 5 days then twice weekly for 6 months 70 g 6   Multiple Vitamin (MULTIVITAMIN WITH MINERALS) TABS Take 1 tablet by mouth daily.     nitroGLYCERIN (NITROSTAT) 0.4 MG SL tablet nitroglycerin 0.4 mg sublingual tablet  USE AS DIRECTED     traZODone (DESYREL) 50 MG tablet Take 1 tablet (50 mg total) by mouth at bedtime. 30 tablet 9   No current facility-administered medications for this visit.    Allergies as of 11/20/2021   (No Known Allergies)    Family History  Problem Relation Age of Onset   Hypertension Mother    Arthritis Father    Hypertension Father    Depression Brother    Allergies Son    Diabetes Maternal Aunt    Prostate cancer Paternal Uncle    Stomach cancer Paternal Grandmother     Social History   Socioeconomic History   Marital status: Widowed    Spouse name: Not on file   Number of children: 5   Years of education: Not on file   Highest education level: Some college, no degree  Occupational History   Occupation: Unemployed    Comment: Has not worked since husband killed in August 2016.    Tobacco Use   Smoking status: Former    Years: 0.50    Types: Cigarettes    Quit date: 07/14/1997    Years since quitting: 24.3   Smokeless tobacco: Never  Vaping Use   Vaping Use: Never used   Substance and Sexual Activity   Alcohol use: Yes    Comment: 2 drinks or so on weekend.   Drug use: Not Currently    Types: Marijuana    Comment: Sometimes using   Sexual activity: Yes    Birth control/protection: Surgical    Comment: novasure  and PPTL  Other Topics Concern   Not on file  Social History Narrative   Lives with 2 youngest children, son and daughter.   Has not worked since estranged husband killed   Does not work.    Depression she feels is what is keeping her from working.   Social Determinants of Health   Financial Resource Strain: Not on file  Food Insecurity: No Food  Insecurity   Worried About Charity fundraiser in the Last Year: Never true   [place] in the Last Year: Never true  Transportation Needs: No Transportation Needs   Lack of Transportation (Medical): No   Lack of Transportation (Non-Medical): No  Physical Activity: Not on file  Stress: Not on file  Social Connections: Not on file  Intimate Partner Violence: Not At Risk   Fear of Current or Ex-Partner: No   Emotionally Abused: No   Physically Abused: No   Sexually Abused: No    Review of Systems:    Constitutional: No weight loss, fever or chills Skin: No rash Cardiovascular: No chest pain Respiratory: No SOB  Gastrointestinal: See HPI and otherwise negative Genitourinary: No dysuria  Neurological: No headache, dizziness or syncope Musculoskeletal: No new muscle or joint pain Hematologic: No bleeding  Psychiatric: +depression   Physical Exam:  Vital signs: BP 124/86   Pulse 68   Ht 5\' 4"  (1.626 m)   Wt 182 lb 6.4 oz (82.7 kg)   BMI 31.31 kg/m    Constitutional:   Pleasant overweight AA female appears to be in NAD, Well developed, Well nourished, alert and cooperative Head:  Normocephalic and atraumatic. Eyes:   PEERL, EOMI. No icterus. Conjunctiva pink. Ears:  Normal auditory acuity. Neck:  Supple Throat: Oral cavity and pharynx without inflammation, swelling or  lesion.  Respiratory: Respirations even and unlabored. Lungs clear to auscultation bilaterally.   No wheezes, crackles, or rhonchi.  Cardiovascular: Normal S1, S2. No MRG. Regular rate and rhythm. No peripheral edema, cyanosis or pallor.  Gastrointestinal:  Soft, nondistended, mild generalized TTP. No rebound or guarding. Normal bowel sounds. No appreciable masses or hepatomegaly. Rectal:  Not performed.  Msk:  Symmetrical without gross deformities. Without edema, no deformity or joint abnormality.  Neurologic:  Alert and  oriented x4;  grossly normal neurologically.  Skin:   Dry and intact without significant lesions or rashes. Psychiatric: Demonstrates good judgement and reason without abnormal affect or behaviors.  RELEVANT LABS AND IMAGING: CBC    Component Value Date/Time   WBC 7.0 06/06/2021 1336   WBC 10.6 (H) 12/19/2013 1825   RBC 4.43 06/06/2021 1336   RBC 3.63 (L) 12/19/2013 1825   HGB 13.8 06/06/2021 1336   HCT 42.5 06/06/2021 1336   PLT 268 06/06/2021 1336   MCV 96 06/06/2021 1336   MCH 31.2 06/06/2021 1336   MCH 32.5 12/19/2013 1825   MCHC 32.5 06/06/2021 1336   MCHC 34.5 12/19/2013 1825   RDW 13.4 06/06/2021 1336   LYMPHSABS 1.5 06/06/2021 1336   MONOABS 0.6 12/19/2013 1825   EOSABS 0.2 06/06/2021 1336   BASOSABS 0.1 06/06/2021 1336    CMP     Component Value Date/Time   NA 141 08/17/2021 0947   K 3.9 08/17/2021 0947   CL 107 (H) 08/17/2021 0947   CO2 24 08/17/2021 0947   GLUCOSE 94 08/17/2021 0947   GLUCOSE 79 12/19/2013 1825   BUN 14 08/17/2021 0947   CREATININE 1.04 (H) 08/17/2021 0947   CALCIUM 8.9 08/17/2021 0947   PROT 6.9 06/06/2021 1336   ALBUMIN 4.1 06/06/2021 1336   AST 14 06/06/2021 1336   ALT 15 06/06/2021 1336   ALKPHOS 91 06/06/2021 1336   BILITOT <0.2 06/06/2021 1336   GFRNONAA 65 03/08/2020 1118   GFRAA 75 03/08/2020 1118    Assessment: 1.  Change in bowel habits: Typically radiates between diarrhea and constipation but lately it  has been  more loose than solid, even waking her up at night; consider colitis versus IBS 2.  GERD: EGD in 2013 with gastropathy, placed on Omeprazole at the time but has since stopped taking this; likely still gastritis +/- PUD 3.  Generalized abdominal cramping: With symptoms above, typically resulting in a loose stool which seems to help some; consider IBS versus colitis versus other  Plan: 1.  Discussed with patient that this very well could be her IBS, certainly given her increase in stress/anxiety lately.  Though we discussed that she has not had screening for colon cancer and this would be a good time to get this accomplished.  Also with her upper GI symptoms we can do an EGD at the same time. 2.  Patient scheduled for a diagnostic colonoscopy and EGD in the [place] with Dr. Carlean Purl.  Did provide the patient a detailed list of risks for the procedures and she agrees to proceed. Patient is appropriate for endoscopic procedure(s) in the ambulatory ([facility]) setting.  3.  Prescribed Omeprazole 40 mg daily, 30-60 minutes before breakfast.  #30 with 5 refills. 4.  Prescribed Dicyclomine 10 mg 3 times daily, 20-30 minutes before meals.  #90 with 5 refills.  Discussed that we can titrate this up as needed. 5.  Patient to follow in clinic per recommendations from Dr. Carlean Purl after time of procedures  Victoria Newer, PA-C [facility] 11/20/2021, 11:39 AM  Cc: Mack Hook, MD

## 2021-11-20 NOTE — Patient Instructions (Addendum)
We have sent the following medications to your pharmacy for you to pick up at your convenience: Omeprazole 40 mg daily 30-60 minutes before breakfast. Dicyclomine 10 mg three times daily 20-30 minutes before meals.  You have been scheduled for an endoscopy and colonoscopy. Please follow the written instructions given to you at your visit today. Please pick up your prep supplies at the pharmacy within the next 1-3 days. If you use inhalers (even only as needed), please bring them with you on the day of your procedure.  If you are age 74 or older, your body mass index should be between 23-30. Your Body mass index is 31.31 kg/m. If this is out of the aforementioned range listed, please consider follow up with your Primary Care Provider.  If you are age 58 or younger, your body mass index should be between 19-25. Your Body mass index is 31.31 kg/m. If this is out of the aformentioned range listed, please consider follow up with your Primary Care Provider.   ________________________________________________________  The [facility] providers would like to encourage you to use [facility] to communicate with providers for non-urgent requests or questions.  Due to long hold times on the telephone, sending your provider a message by [facility] may be a faster and more efficient way to get a response.  Please allow 48 business hours for a response.  Please remember that this is for non-urgent requests.  _______________________________________________________

## 2021-12-14 ENCOUNTER — Encounter: Payer: Self-pay | Admitting: Internal Medicine

## 2021-12-14 ENCOUNTER — Ambulatory Visit (AMBULATORY_SURGERY_CENTER): Payer: Medicaid Other | Admitting: Internal Medicine

## 2021-12-14 VITALS — BP 139/98 | HR 78 | Temp 98.7°F | Resp 12 | Ht 64.0 in | Wt 182.0 lb

## 2021-12-14 DIAGNOSIS — K2970 Gastritis, unspecified, without bleeding: Secondary | ICD-10-CM

## 2021-12-14 DIAGNOSIS — R12 Heartburn: Secondary | ICD-10-CM

## 2021-12-14 DIAGNOSIS — R194 Change in bowel habit: Secondary | ICD-10-CM

## 2021-12-14 DIAGNOSIS — K319 Disease of stomach and duodenum, unspecified: Secondary | ICD-10-CM | POA: Diagnosis not present

## 2021-12-14 DIAGNOSIS — K219 Gastro-esophageal reflux disease without esophagitis: Secondary | ICD-10-CM

## 2021-12-14 MED ORDER — SODIUM CHLORIDE 0.9 % IV SOLN
500.0000 mL | Freq: Once | INTRAVENOUS | Status: DC
Start: 2021-12-14 — End: 2021-12-14

## 2021-12-14 NOTE — Op Note (Signed)
[place] Patient Name: [name] Procedure Date: 12/14/2021 3:00 PM MRN: [id number] Endoscopist: Gatha Mayer , MD Age: 51 Referring MD:  Date of Birth: [birth year] Gender: Female Account #: [id number] Procedure:                Colonoscopy Indications:              Change in bowel habits Medicines:                Propofol per Anesthesia, Monitored Anesthesia Care Procedure:                Pre-Anesthesia Assessment:                           - Prior to the procedure, a History and Physical                            was performed, and patient medications and                            allergies were reviewed. The patient's tolerance of                            previous anesthesia was also reviewed. The risks                            and benefits of the procedure and the sedation                            options and risks were discussed with the patient.                            All questions were answered, and informed consent                            was obtained. Prior Anticoagulants: The patient has                            taken no previous anticoagulant or antiplatelet                            agents. ASA Grade Assessment: II - A patient with                            mild systemic disease. After reviewing the risks                            and benefits, the patient was deemed in                            satisfactory condition to undergo the procedure.                           After obtaining informed consent, the colonoscope  was passed under direct vision. Throughout the                            procedure, the patient's blood pressure, pulse, and                            oxygen saturations were monitored continuously. The                            Olympus CF-HQ190L [id number]) Colonoscope was                            introduced through the anus and advanced to the the                            cecum,  identified by appendiceal orifice and                            ileocecal valve. The colonoscopy was performed                            without difficulty. The patient tolerated the                            procedure well. The quality of the bowel                            preparation was good. The terminal ileum, ileocecal                            valve, appendiceal orifice, and rectum were                            photographed. The bowel preparation used was                            Miralax via split dose instruction. Scope In: 3:19:51 PM Scope Out: 3:34:00 PM Scope Withdrawal Time: 0 hours 8 minutes 31 seconds  Total Procedure Duration: 0 hours 14 minutes 9 seconds  Findings:                 The digital rectal exam findings include decreased                            sphincter tone.                           The terminal ileum appeared normal.                           The entire examined colon appeared normal on direct                            and retroflexion views. Complications:            No immediate complications. Estimated  Blood Loss:     Estimated blood loss: none. Impression:               - Decreased sphincter tone found on digital rectal                            exam.                           - The examined portion of the ileum was normal.                           - The entire examined colon is normal on direct and                            retroflexion views.                           - No specimens collected. Recommendation:           - Patient has a contact number available for                            emergencies. The signs and symptoms of potential                            delayed complications were discussed with the                            patient. Return to normal activities tomorrow.                            Written discharge instructions were provided to the                            patient.                           - Resume  previous diet.                           - Continue present medications. dicyclomine helps.                            PPI helping upper sxs                           - Repeat colonoscopy in 10 years for screening                            purposes. CC: Georgia Dom, MD Gatha Mayer, MD 12/14/2021 3:44:19 PM This report has been signed electronically.

## 2021-12-14 NOTE — Progress Notes (Signed)
VS by DT  Pt's states no medical or surgical changes since previsit or office visit.  

## 2021-12-14 NOTE — Progress Notes (Signed)
Report to PACU, RN, vss, BBS= Clear.  

## 2021-12-14 NOTE — Patient Instructions (Addendum)
Glad you are feeling better.  The stomach lining was irritated. I took biopsies. Colonoscopy was ok.  I will let you know pathology results and any new recommendations.  Please continue current treatment plan.  I appreciate the opportunity to care for you. Gatha Mayer, MD, [facility]  Handout on gastritis provided.   YOU HAD AN ENDOSCOPIC PROCEDURE TODAY AT [facility]:   Refer to the procedure report that was given to you for any specific questions about what was found during the examination.  If the procedure report does not answer your questions, please call your gastroenterologist to clarify.  If you requested that your care partner not be given the details of your procedure findings, then the procedure report has been included in a sealed envelope for you to review at your convenience later.  YOU SHOULD EXPECT: Some feelings of bloating in the abdomen. Passage of more gas than usual.  Walking can help get rid of the air that was put into your GI tract during the procedure and reduce the bloating. If you had a lower endoscopy (such as a colonoscopy or flexible sigmoidoscopy) you may notice spotting of blood in your stool or on the toilet paper. If you underwent a bowel prep for your procedure, you may not have a normal bowel movement for a few days.  Please Note:  You might notice some irritation and congestion in your nose or some drainage.  This is from the oxygen used during your procedure.  There is no need for concern and it should clear up in a day or so.  SYMPTOMS TO REPORT IMMEDIATELY:  Following lower endoscopy (colonoscopy or flexible sigmoidoscopy):  Excessive amounts of blood in the stool  Significant tenderness or worsening of abdominal pains  Swelling of the abdomen that is new, acute  Fever of 100F or higher  Following upper endoscopy (EGD)  Vomiting of blood or coffee ground material  New chest pain or pain under the shoulder blades  Painful or  persistently difficult swallowing  New shortness of breath  Fever of 100F or higher  Black, tarry-looking stools  For urgent or emergent issues, a gastroenterologist can be reached at any hour by calling [phone]. Do not use MyChart messaging for urgent concerns.    DIET:  We do recommend a small meal at first, but then you may proceed to your regular diet.  Drink plenty of fluids but you should avoid alcoholic beverages for 24 hours.  ACTIVITY:  You should plan to take it easy for the rest of today and you should NOT DRIVE or use heavy machinery until tomorrow (because of the sedation medicines used during the test).    FOLLOW UP: Our staff will call the number listed on your records 48-72 hours following your procedure to check on you and address any questions or concerns that you may have regarding the information given to you following your procedure. If we do not reach you, we will leave a message.  We will attempt to reach you two times.  During this call, we will ask if you have developed any symptoms of COVID 19. If you develop any symptoms (ie: fever, flu-like symptoms, shortness of breath, cough etc.) before then, please call [phone].  If you test positive for Covid 19 in the 2 weeks post procedure, please call and report this information to [place].    If any biopsies were taken you will be contacted by phone or by letter within the next  1-3 weeks.  Please call us at [phone] if you have not heard about the biopsies in 3 weeks.    SIGNATURES/CONFIDENTIALITY: You and/or your care partner have signed paperwork which will be entered into your electronic medical record.  These signatures attest to the fact that that the information above on your After Visit Summary has been reviewed and is understood.  Full responsibility of the confidentiality of this discharge information lies with you and/or your care-partner.

## 2021-12-14 NOTE — Progress Notes (Signed)
Called to room to assist during endoscopic procedure.  Patient ID and intended procedure confirmed with present staff. Received instructions for my participation in the procedure from the performing physician.  

## 2021-12-14 NOTE — Op Note (Signed)
[place] Patient Name: [name] Procedure Date: 12/14/2021 3:01 PM MRN: [id number] Endoscopist: Gatha Mayer , MD Age: 51 Referring MD:  Date of Birth: Aug 01, 1971 Gender: Female Account #: [id number] Procedure:                Upper GI endoscopy Indications:              Abdominal pain in the left upper quadrant, Heartburn Medicines:                Propofol per Anesthesia, Monitored Anesthesia Care Procedure:                Pre-Anesthesia Assessment:                           - Prior to the procedure, a History and Physical                            was performed, and patient medications and                            allergies were reviewed. The patient's tolerance of                            previous anesthesia was also reviewed. The risks                            and benefits of the procedure and the sedation                            options and risks were discussed with the patient.                            All questions were answered, and informed consent                            was obtained. Prior Anticoagulants: The patient has                            taken no previous anticoagulant or antiplatelet                            agents. ASA Grade Assessment: II - A patient with                            mild systemic disease. After reviewing the risks                            and benefits, the patient was deemed in                            satisfactory condition to undergo the procedure.                           After obtaining informed consent, the endoscope was  passed under direct vision. Throughout the                            procedure, the patient's blood pressure, pulse, and                            oxygen saturations were monitored continuously. The                            GIF [id number] #[id number] was introduced through the                            mouth, and advanced to the second part of duodenum.                             The upper GI endoscopy was accomplished without                            difficulty. The patient tolerated the procedure                            well. Scope In: Scope Out: Findings:                 Multiple dispersed small erosions with no bleeding                            and no stigmata of recent bleeding were found in                            the gastric antrum. Biopsies were taken with a cold                            forceps for histology. Verification of patient                            identification for the specimen was done. Estimated                            blood loss was minimal.                           The exam was otherwise without abnormality.                           The cardia and gastric fundus were normal on                            retroflexion. Complications:            No immediate complications. Estimated Blood Loss:     Estimated blood loss was minimal. Impression:               - Erosive gastropathy with no bleeding and no  stigmata of recent bleeding. Biopsied.                           - The examination was otherwise normal. Recommendation:           - Patient has a contact number available for                            emergencies. The signs and symptoms of potential                            delayed complications were discussed with the                            patient. Return to normal activities tomorrow.                            Written discharge instructions were provided to the                            patient.                           - Resume previous diet.                           - Continue present medications.                           - Await pathology results.                           - See the other procedure note for documentation of                            additional recommendations. CC: Georgia Dom, MD Gatha Mayer, MD 12/14/2021 3:41:31 PM This report has  been signed electronically.

## 2021-12-14 NOTE — Progress Notes (Signed)
History and Physical Interval Note:  12/14/2021 3:07 PM  [name]  has presented today for endoscopic procedure(s), with the diagnosis of  Encounter Diagnoses  Name Primary?   Gastroesophageal reflux disease, unspecified whether esophagitis present Yes   Change in bowel habits   .  The various methods of evaluation and treatment have been discussed with the patient and/or family. After consideration of risks, benefits and other options for treatment, the patient has consented to  the endoscopic procedure(s).   The patient's history has been reviewed, patient examined, no change in status, stable for endoscopic procedure(s).  I have reviewed the patient's chart and labs.  Questions were answered to the patient's satisfaction.     Gatha Mayer, MD, [facility]

## 2021-12-18 ENCOUNTER — Telehealth: Payer: Self-pay | Admitting: *Deleted

## 2021-12-18 NOTE — Telephone Encounter (Signed)
Called pt back to let her know that Dr Carlean Purl is aware and to call Dr Carlean Purl office if the symptoms worsen.

## 2021-12-18 NOTE — Telephone Encounter (Signed)
°  Follow up Call-  Call back number 12/14/2021  Post procedure Call Back phone  # [phone]  Permission to leave phone message Yes  Some recent data might be hidden     Patient questions:  Do you have a fever, pain , or abdominal swelling? Yes.   Pain Score  4 *  Have you tolerated food without any problems? No.  Have you been able to return to your normal activities? No.  Do you have any questions about your discharge instructions: Diet   No. Medications  No. Follow up visit  No.  Do you have questions or concerns about your Care? No.  Actions: * If pain score is 4 or above: Physician/ provider Notified : Silvano Rusk, MD.  Pt states that today (12/18/21) is the first day that she has been able to eat. Pt states that she has increased pain and nausea after eating that started after her procedure.

## 2021-12-18 NOTE — Telephone Encounter (Signed)
Noted No new recommendations as she is improving

## 2021-12-19 ENCOUNTER — Encounter: Payer: Self-pay | Admitting: Internal Medicine

## 2021-12-19 ENCOUNTER — Telehealth: Payer: Self-pay | Admitting: Gastroenterology

## 2021-12-19 MED ORDER — ONDANSETRON HCL 4 MG PO TABS: 4.0000 mg | ORAL_TABLET | Freq: Three times a day (TID) | ORAL | 0 refills | Status: DC | PRN

## 2021-12-19 NOTE — Telephone Encounter (Signed)
Patient called the on call provider this evening because of persistent symptoms following her EGD and colonoscopy on Jan 5th.  EGD/Colo reports showed normal colonoscopy and EGD with gastric erosions with reactive gastropathy on pathology.  Started on omeprazole. Ever since the procedure, the patient reports that she has had nausea, no appetite and recurrent headaches (not constant, coming and going).  No vomiting.  No fevers.  No significant abdominal pain.  No cough/dyspnea.  She felt a little unsteady on her feet transiently the other day, but no other neurologic symptoms. She has been taking ibuprofen for her headaches. Low suspicion for a complication from the procedure, which was the patient's concern.  Some of her symptoms may be side effects of the Prilosec.  I recommended she try stopping the Prilosec for a few days to see if her symptoms improve.  Recommended she not take Ibuprofen and that she take Tylenol as needed for the headaches.  Recommended she drink plenty of fluids.  Zofran prescribed to help with the nausea. Will pass along to Dr. Carlean Purl for his awareness.  Victoria Fierro E. Candis Schatz, MD [facility]

## 2022-01-09 ENCOUNTER — Telehealth: Payer: Self-pay | Admitting: Gastroenterology

## 2022-01-09 ENCOUNTER — Telehealth: Payer: Self-pay | Admitting: Internal Medicine

## 2022-01-09 NOTE — Telephone Encounter (Signed)
Pt requesting results for recent procedure: Pt made aware of the results from the letter that was created by Dr. Carlean Purl. Letter sent to pt via Mail: Pt made aware: Pt verbalized understanding with all questions answered.

## 2022-01-09 NOTE — Telephone Encounter (Signed)
ERROR

## 2022-01-09 NOTE — Telephone Encounter (Signed)
Patient called requesting results from procedure done on 1/5. Please advise.

## 2022-02-06 ENCOUNTER — Other Ambulatory Visit: Payer: Self-pay

## 2022-02-06 ENCOUNTER — Encounter: Payer: Self-pay | Admitting: Internal Medicine

## 2022-02-06 ENCOUNTER — Ambulatory Visit: Payer: Medicaid Other | Admitting: Internal Medicine

## 2022-02-06 VITALS — BP 144/102 | HR 76 | Resp 16 | Ht 64.0 in | Wt 183.5 lb

## 2022-02-06 DIAGNOSIS — F32A Depression, unspecified: Secondary | ICD-10-CM

## 2022-02-06 DIAGNOSIS — N950 Postmenopausal bleeding: Secondary | ICD-10-CM

## 2022-02-06 DIAGNOSIS — H6981 Other specified disorders of Eustachian tube, right ear: Secondary | ICD-10-CM

## 2022-02-06 DIAGNOSIS — I10 Essential (primary) hypertension: Secondary | ICD-10-CM | POA: Diagnosis not present

## 2022-02-06 DIAGNOSIS — K0889 Other specified disorders of teeth and supporting structures: Secondary | ICD-10-CM | POA: Diagnosis not present

## 2022-02-06 DIAGNOSIS — F4310 Post-traumatic stress disorder, unspecified: Secondary | ICD-10-CM | POA: Diagnosis not present

## 2022-02-06 DIAGNOSIS — R7989 Other specified abnormal findings of blood chemistry: Secondary | ICD-10-CM

## 2022-02-06 MED ORDER — CITALOPRAM HYDROBROMIDE 10 MG PO TABS: 10.0000 mg | ORAL_TABLET | Freq: Every day | ORAL | 11 refills | Status: DC

## 2022-02-06 MED ORDER — AMLODIPINE BESYLATE 5 MG PO TABS: ORAL_TABLET | ORAL | 11 refills | Status: DC

## 2022-02-06 NOTE — Progress Notes (Signed)
Subjective:    Patient ID: [name], female   DOB: Aug 25, 1971, 51 y.o.   MRN: [id number]   HPI   Depression:  has not taken her meds for depression: bupropion nor Fluoxetine as felt she was just burning up when taking them.  Has not taken since December.  She never heard from psychiatry for follow up.  After seen in August and referral made, she actually felt she was doing ok, but then suddenly stopped both medications at same time and finds herself crying more.  She has thought of suicide, last was a couple days ago.  She did not have a defined plan, but states always thinks of taking "something".  She has no particular medication stock piled to do this and states "I wouldn't know what to take.   Has taken Celexa in past, which worked for her.  Perhaps has also taken Zoloft--was feeling better, so stopped.  2.  Postmenopausal bleeding:  did have follow up with Dr. Kennon Rounds, [place] in September after CPE.  At the time, she had stopped bleeding and did not tolerate OP endometrial  biopsy.  She is to call Dr. Kennon Rounds if bleeding recurs and undergo D & C under anesthesia.   3.  Right ear eustachian tube dysfunction:  resolved.  4.   Elevated BP:  stopped Amlodipine also about the same time she stopped her anti depressants.  Tired of taking meds in general.    5.  Right lower molar with crown that seems loose with pain.  Needs a dentist.  Current Meds  Medication Sig   acetaminophen (TYLENOL) 500 MG tablet Take 500 mg by mouth every 6 (six) hours as needed.   fluticasone (FLONASE) 50 MCG/ACT nasal spray Use 2 spray(s) in each nostril once daily   metroNIDAZOLE (METROGEL) 0.75 % vaginal gel 1 applicatorful vaginally twice daily for 5 days then twice weekly for 6 months   Multiple Vitamin (MULTIVITAMIN WITH MINERALS) TABS Take 1 tablet by mouth daily.   nitroGLYCERIN (NITROSTAT) 0.4 MG SL tablet nitroglycerin 0.4 mg sublingual tablet  USE AS DIRECTED   omeprazole (PRILOSEC) 40 MG capsule  Take 1 capsule (40 mg total) by mouth daily.   ondansetron (ZOFRAN) 4 MG tablet Take 1 tablet (4 mg total) by mouth every 8 (eight) hours as needed for nausea or vomiting.   No Known Allergies   Review of Systems    Objective:   BP (!) 144/102 (BP Location: Left Arm, Patient Position: Sitting, Cuff Size: Normal)   Pulse 76   Resp 16   Ht 5\' 4"  (1.626 m)   Wt 183 lb 8 oz (83.2 kg)   BMI 31.50 kg/m   Physical Exam NAD Depressed affect HEENT:  PERRL, EOMI, TMs pearly gray, throat without injection.  Right lower molar with loose fitting crown. Neck:Supple, No adenopathy, no thyromegaly Chest:  CTA CV:  RRR without murmur or rub.  Radial pulses normal and equal.   LE:  No edema.   Assessment & Plan    Depression/PTSD/menopause:  Start Citalopram 10 mg daily.  Follow up in 1 week.  Needs to be seen by psychiatry.  Look into previous referral.     2.  Hypertension:  start Amlodipine up again.  Increase to 5 mg daily.  Urged her to stay on meds unless she calls first.  Follow up in 1 week.  3.  Dental pain with loose crown:  handout on dentists covered by Medicaid.  4.  Right Eustachian  tube dysfunction:  resolved.  5.  Postmenopausal bleeding:  to be readdressed if recurs.  6.  Elevated Creatinine:  UA

## 2022-02-07 LAB — URINALYSIS, MICROSCOPIC ONLY: Casts: NONE SEEN /lpf

## 2022-02-27 ENCOUNTER — Encounter: Payer: Self-pay | Admitting: Internal Medicine

## 2022-02-27 ENCOUNTER — Other Ambulatory Visit: Payer: Self-pay

## 2022-02-27 ENCOUNTER — Ambulatory Visit: Payer: Medicaid Other | Admitting: Internal Medicine

## 2022-02-27 VITALS — BP 132/88 | HR 76 | Resp 20 | Ht 64.0 in | Wt 184.0 lb

## 2022-02-27 DIAGNOSIS — F332 Major depressive disorder, recurrent severe without psychotic features: Secondary | ICD-10-CM

## 2022-02-27 DIAGNOSIS — G4700 Insomnia, unspecified: Secondary | ICD-10-CM

## 2022-02-27 DIAGNOSIS — F4310 Post-traumatic stress disorder, unspecified: Secondary | ICD-10-CM | POA: Diagnosis not present

## 2022-02-27 MED ORDER — SERTRALINE HCL 50 MG PO TABS: 50.0000 mg | ORAL_TABLET | Freq: Every day | ORAL | 11 refills | Status: DC

## 2022-02-27 MED ORDER — NORTRIPTYLINE HCL 10 MG PO CAPS: ORAL_CAPSULE | ORAL | 6 refills | Status: DC

## 2022-02-27 NOTE — Progress Notes (Signed)
    Subjective:    Patient ID: [name], female   DOB: Mar 14, 1971, 51 y.o.   MRN: [id number]   HPI   Hypertension:  Taking Amlodipine regularly.  BP better today.    2.  Depression:  Since starting Citalopram, has had loose stools. She does carry the diagnosis of IBS.  Still crying as much as she was previously.  Son placed in mental health facility past few days.  She does believe he has depression and has had a lot of trauma in life with loss of father and sister, but mom believes some of his behavior is drug related--that he got hold of drugs laced with something else.  Was previously on Fluoxetine and Bupropion, but stopped due to feeling she was burning up.  Trazodone for sleep causes this as well.   Has not heard from psychiatry.    3.  Right lower molar issues:  has appt with dentist tomorrow.    Current Meds  Medication Sig   acetaminophen (TYLENOL) 500 MG tablet Take 500 mg by mouth every 6 (six) hours as needed.   amLODipine (NORVASC) 5 MG tablet 1 tab by mouth daily   citalopram (CELEXA) 10 MG tablet Take 1 tablet (10 mg total) by mouth daily.   fluticasone (FLONASE) 50 MCG/ACT nasal spray Use 2 spray(s) in each nostril once daily   metroNIDAZOLE (METROGEL) 0.75 % vaginal gel 1 applicatorful vaginally twice daily for 5 days then twice weekly for 6 months   Multiple Vitamin (MULTIVITAMIN WITH MINERALS) TABS Take 1 tablet by mouth daily.   nitroGLYCERIN (NITROSTAT) 0.4 MG SL tablet nitroglycerin 0.4 mg sublingual tablet  USE AS DIRECTED   omeprazole (PRILOSEC) 40 MG capsule Take 1 capsule (40 mg total) by mouth daily.   ondansetron (ZOFRAN) 4 MG tablet Take 1 tablet (4 mg total) by mouth every 8 (eight) hours as needed for nausea or vomiting.   No Known Allergies   Review of Systems    Objective:   BP 132/88 (BP Location: Right Arm, Patient Position: Sitting, Cuff Size: Normal)   Pulse 76   Resp 20   Ht '5\' 4"'$  (1.626 m)   Wt 184 lb (83.5 kg)   BMI 31.58  kg/m   Physical Exam Depressed mood HEENT:  PERRL, EOMI Neck:  Supple No adenopaty Chest:  CTA CV:  RRR   Assessment & Plan   PTSD/depression:   Much difficulty keeping her on medication and finding the right mix.  Will try Sertraline 50 mg daily and discontinue Citalopram.  To call if problem with mediation.  Nortriptyline 10 mg at bedtime for sleep.  Does not appear psychiatry has attempted to contact patient.  Referral to Dr. Toy Care.    2.  Dental pain:  dental appt tomorrow.  3.  Hypertension:  improved control.

## 2022-03-13 ENCOUNTER — Ambulatory Visit: Payer: Medicaid Other | Admitting: Internal Medicine

## 2022-03-19 ENCOUNTER — Ambulatory Visit: Payer: Medicaid Other | Admitting: Internal Medicine

## 2022-03-23 ENCOUNTER — Telehealth: Payer: Self-pay | Admitting: Internal Medicine

## 2022-03-23 NOTE — Telephone Encounter (Signed)
Pt called to report she is sick and would need to cancel her appt on 03/19/22 to talk about her start of zoloft and Nortriptyline. Pt stated she will call back at another time to reschedule. Pt reported she is feeling fine with the start of zoloft and Nortiptyline. ?

## 2022-04-06 ENCOUNTER — Other Ambulatory Visit: Payer: Medicaid Other

## 2022-04-10 ENCOUNTER — Telehealth (HOSPITAL_COMMUNITY): Payer: Self-pay | Admitting: Licensed Clinical Social Worker

## 2022-04-10 ENCOUNTER — Encounter (HOSPITAL_COMMUNITY): Payer: Self-pay

## 2022-04-10 ENCOUNTER — Ambulatory Visit (HOSPITAL_COMMUNITY): Payer: Medicaid Other | Admitting: Licensed Clinical Social Worker

## 2022-04-10 NOTE — Telephone Encounter (Signed)
The therapist logs into the virtual waiting room at 1 p.m. EST sending the link to the visit both via text and email. After [name] does not arrive in the room by ten after the hour, this therapist calls her leaving a HIPAA-compliant voicemail and asking that if she wants to reschedule to call [phone]. ? ?Adam Phenix, MA, LCSW, [facility], LCAS ?04/10/2022 ?

## 2022-04-11 ENCOUNTER — Other Ambulatory Visit: Payer: Self-pay | Admitting: General Surgery

## 2022-05-03 ENCOUNTER — Other Ambulatory Visit (INDEPENDENT_AMBULATORY_CARE_PROVIDER_SITE_OTHER): Payer: Medicaid Other

## 2022-05-03 DIAGNOSIS — Z79899 Other long term (current) drug therapy: Secondary | ICD-10-CM | POA: Diagnosis not present

## 2022-05-04 LAB — BASIC METABOLIC PANEL
BUN/Creatinine Ratio: 13 (ref 9–23)
BUN: 13 mg/dL (ref 6–24)
CO2: 23 mmol/L (ref 20–29)
Calcium: 9.5 mg/dL (ref 8.7–10.2)
Chloride: 105 mmol/L (ref 96–106)
Creatinine, Ser: 0.97 mg/dL (ref 0.57–1.00)
Glucose: 91 mg/dL (ref 70–99)
Potassium: 3.7 mmol/L (ref 3.5–5.2)
Sodium: 143 mmol/L (ref 134–144)
eGFR: 70 mL/min/{1.73_m2} (ref >59)

## 2022-06-06 ENCOUNTER — Telehealth: Payer: Self-pay

## 2022-06-06 NOTE — Telephone Encounter (Signed)
Please schedule next available or place on wait list for next couple of weeks.

## 2022-06-06 NOTE — Telephone Encounter (Signed)
Patient would like a follow up appointment after ED visit. Patient was told she experienced a mini stroke

## 2022-06-11 ENCOUNTER — Ambulatory Visit: Payer: Medicaid Other | Admitting: Internal Medicine

## 2022-06-11 ENCOUNTER — Encounter: Payer: Self-pay | Admitting: Internal Medicine

## 2022-06-11 VITALS — BP 130/78 | HR 100 | Resp 16 | Ht 64.0 in | Wt 182.0 lb

## 2022-06-11 DIAGNOSIS — R8290 Unspecified abnormal findings in urine: Secondary | ICD-10-CM | POA: Diagnosis not present

## 2022-06-11 DIAGNOSIS — R29810 Facial weakness: Secondary | ICD-10-CM | POA: Diagnosis not present

## 2022-06-11 DIAGNOSIS — G459 Transient cerebral ischemic attack, unspecified: Secondary | ICD-10-CM | POA: Diagnosis not present

## 2022-06-11 DIAGNOSIS — G9389 Other specified disorders of brain: Secondary | ICD-10-CM

## 2022-06-11 DIAGNOSIS — K118 Other diseases of salivary glands: Secondary | ICD-10-CM

## 2022-06-11 DIAGNOSIS — F332 Major depressive disorder, recurrent severe without psychotic features: Secondary | ICD-10-CM

## 2022-06-11 DIAGNOSIS — N898 Other specified noninflammatory disorders of vagina: Secondary | ICD-10-CM

## 2022-06-11 LAB — POCT URINALYSIS DIPSTICK
Bilirubin, UA: NEGATIVE
Blood, UA: NEGATIVE
Glucose, UA: NEGATIVE
Ketones, UA: NEGATIVE
Leukocytes, UA: NEGATIVE
Nitrite, UA: NEGATIVE
Protein, UA: NEGATIVE
Spec Grav, UA: 1.015 (ref 1.010–1.025)
Urobilinogen, UA: 0.2 E.U./dL
pH, UA: 7 (ref 5.0–8.0)

## 2022-06-11 MED ORDER — ASPIRIN 81 MG PO TBEC: 81.0000 mg | DELAYED_RELEASE_TABLET | Freq: Every day | ORAL | 12 refills | Status: DC

## 2022-06-11 NOTE — Progress Notes (Signed)
Subjective:    Patient ID: [name], female   DOB: 04-13-1971, 51 y.o.   MRN: [id number]   HPI  Here for follow up of visit to [facility] on [facility].    Was seen for symptoms that occurred on Sat, June 17th in the evening around 6-7 p.m.:    Left face started feeling funny while watching TV.  Went and looked in mirror and entire left face was drooping.  Was unable to raise left eyebrow as high as right, though able to wrinkle forehead a bit.  Was unable to completely close her left eyelids and her face drooped on left with smiling.  Also, had left UE weakness and perhaps some tingling, the latter continues today.  No LE symptoms.   Facial and L UE weakness lasted about 40-45 minutes.  Stayed in bed the rest of the evening.   She did not seek medical attention at the time.  Apparently called here before going to urgent care on 06/06/2022--went for sense of exhaustion.  States the exhaustion is a generalized physical weakness that is new since the episode and different from her chronic fatigue associated with her major depression.  States was the reason for finally seeking medical care.  BP in urgent care on 6/28 was 158/98.  States she was taking her amlodipine daily prior to and during days since the episode.  BP in March and today in 130s/78-80s.  With regards to her depression, she has been taking the sertraline and Nortriptyline regularly.  Relates she has missed her medication from time to time.  States, however, she never misses Amlodipine.  She has missed follow up both here and her referral to [facility] from back beginning of April.   History of MI:  occurred with PE immediately following NVD birth of son in 2004.  Cannot recall if and when she was on anticoagulants.  States she was hospitalized for 1 month.  ICU for about 1 week.  Was followed up by Dr. Terrence Dupont.  No record in chart for any of this.   Cannot recall if had a hypercoaguable work up No  family history of CAD, hypercoaguability/strokes/PEs or other clotting disorders.   Blood glucose has always been fine.   Last FLP 08/2021 and fine: Lipid Panel     Component Value Date/Time   CHOL 169 08/17/2021 0947   TRIG 75 08/17/2021 0947   HDL 51 08/17/2021 0947   LDLCALC 104 (H) 08/17/2021 0947   LDLDIRECT 69 03/28/2010 2118   LABVLDL 14 08/17/2021 0947    .    Current Meds  Medication Sig   acetaminophen (TYLENOL) 500 MG tablet Take 500 mg by mouth every 6 (six) hours as needed.   amLODipine (NORVASC) 5 MG tablet 1 tab by mouth daily   fluticasone (FLONASE) 50 MCG/ACT nasal spray Use 2 spray(s) in each nostril once daily   Multiple Vitamin (MULTIVITAMIN WITH MINERALS) TABS Take 1 tablet by mouth daily.   nitroGLYCERIN (NITROSTAT) 0.4 MG SL tablet nitroglycerin 0.4 mg sublingual tablet  USE AS DIRECTED   nortriptyline (PAMELOR) 10 MG capsule 1 cap by mouth daily at bedtime   omeprazole (PRILOSEC) 40 MG capsule Take 1 capsule (40 mg total) by mouth daily.   ondansetron (ZOFRAN) 4 MG tablet Take 1 tablet (4 mg total) by mouth every 8 (eight) hours as needed for nausea or vomiting.   sertraline (ZOLOFT) 50 MG tablet Take 1 tablet (50 mg total) by mouth daily.  Cancel the Citalopram   No Known Allergies   Review of Systems    Objective:   BP 130/78 (BP Location: Right Arm, Patient Position: Sitting, Cuff Size: Normal)   Pulse 100   Resp 16   Ht '5\' 4"'$  (1.626 m)   Wt 182 lb (82.6 kg)   BMI 31.24 kg/m   Physical Exam NAD HEENT:  PERRL, EOMI, Discs sharp.  TMs pearly gray, throat without injection. Neck:  Supple, No adenopathy Chest:  CTA CV:  RRR with normal S1 and S2, No S3, S4 or murmur.  No carotid bruits.  Carotid, radial and DP pulses normal and equal Abd:  S, NT, + BS.  No HSM or mass.  Neuro:  A & O x 3, CN II-XII grossly intact, DTRs 2+/4, Motor 5/5, sensory to pin prick and light touch normal and symmetric.  Gait normal.   Assessment & Plan    Transient Left sided facial and UE weakness on June 17th.  Not clear why she did not seek medical care.   Did pursue attention about 11 days later as she remained exhausted following the episode.   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 MR with and without contrast of brain.   CBC, CMP, hypercoag panel, lipids, A1C Also asking her to be seen again by her cardiologist, Dr. Terrence Dupont, for possible echo Carotid doppler as well. ASA 81 mg daily.  2.  Depression:  encouraged her to be consistent with medication.  Continues to have issues with depression and also do not believe she has had adequate focus on treatment for trauma suffered throughout life, nor has she had adequate counseling in general.  Seems to feel a stigma with receiving counseling.Marland Kitchen  Referral to Dr Dwyane Dee for psychiatric evaluation and treatment.  3.  BTW:  having vaginal itching with odor since last night.  Also dark urine.  No dysuria.  Did use Metronidazole cream last night and feels better.  Not sexually active for over 1 year.   UA was normal Appears to have mild BV--to restart vaginal Metrogel treatment for 5 days, then twice weekly.

## 2022-06-13 ENCOUNTER — Other Ambulatory Visit (INDEPENDENT_AMBULATORY_CARE_PROVIDER_SITE_OTHER): Payer: Medicaid Other

## 2022-06-13 DIAGNOSIS — R29810 Facial weakness: Secondary | ICD-10-CM | POA: Diagnosis not present

## 2022-06-13 DIAGNOSIS — G459 Transient cerebral ischemic attack, unspecified: Secondary | ICD-10-CM | POA: Diagnosis not present

## 2022-06-14 ENCOUNTER — Ambulatory Visit
Admission: RE | Admit: 2022-06-14 | Discharge: 2022-06-14 | Disposition: A | Discharge status: Home / Self Care | Payer: Medicaid Other | Source: Ambulatory Visit | Attending: Internal Medicine | Admitting: Internal Medicine

## 2022-06-14 DIAGNOSIS — R29810 Facial weakness: Secondary | ICD-10-CM

## 2022-06-14 DIAGNOSIS — G459 Transient cerebral ischemic attack, unspecified: Secondary | ICD-10-CM

## 2022-06-14 LAB — CBC WITH DIFFERENTIAL/PLATELET
Basophils Absolute: 0.1 10*3/uL (ref 0.0–0.2)
Basos: 1 %
EOS (ABSOLUTE): 0.2 10*3/uL (ref 0.0–0.4)
Eos: 3 %
Hematocrit: 38.3 % (ref 34.0–46.6)
Hemoglobin: 13.1 g/dL (ref 11.1–15.9)
Immature Grans (Abs): 0 10*3/uL (ref 0.0–0.1)
Immature Granulocytes: 0 %
Lymphocytes Absolute: 1.6 10*3/uL (ref 0.7–3.1)
Lymphs: 26 %
MCH: 31.8 pg (ref 26.6–33.0)
MCHC: 34.2 g/dL (ref 31.5–35.7)
MCV: 93 fL (ref 79–97)
Monocytes Absolute: 0.4 10*3/uL (ref 0.1–0.9)
Monocytes: 7 %
Neutrophils Absolute: 3.9 10*3/uL (ref 1.4–7.0)
Neutrophils: 63 %
Platelets: 304 10*3/uL (ref 150–450)
RBC: 4.12 x10E6/uL (ref 3.77–5.28)
RDW: 13.7 % (ref 11.7–15.4)
WBC: 6.2 10*3/uL (ref 3.4–10.8)

## 2022-06-14 LAB — COMPREHENSIVE METABOLIC PANEL
ALT: 13 IU/L (ref 0–32)
AST: 18 IU/L (ref 0–40)
Albumin/Globulin Ratio: 1.8 (ref 1.2–2.2)
Albumin: 4.5 g/dL (ref 3.8–4.9)
Alkaline Phosphatase: 84 IU/L (ref 44–121)
BUN/Creatinine Ratio: 10 (ref 9–23)
BUN: 11 mg/dL (ref 6–24)
Bilirubin Total: 0.3 mg/dL (ref 0.0–1.2)
CO2: 23 mmol/L (ref 20–29)
Calcium: 10 mg/dL (ref 8.7–10.2)
Chloride: 107 mmol/L — ABNORMAL HIGH (ref 96–106)
Creatinine, Ser: 1.07 mg/dL — ABNORMAL HIGH (ref 0.57–1.00)
Globulin, Total: 2.5 g/dL (ref 1.5–4.5)
Glucose: 86 mg/dL (ref 70–99)
Potassium: 3.9 mmol/L (ref 3.5–5.2)
Sodium: 144 mmol/L (ref 134–144)
Total Protein: 7 g/dL (ref 6.0–8.5)
eGFR: 62 mL/min/{1.73_m2} (ref >59)

## 2022-06-14 LAB — LIPID PANEL W/O CHOL/HDL RATIO
Cholesterol, Total: 217 mg/dL — ABNORMAL HIGH (ref 100–199)
HDL: 57 mg/dL (ref >39)
LDL Chol Calc (NIH): 133 mg/dL — ABNORMAL HIGH (ref 0–99)
Triglycerides: 153 mg/dL — ABNORMAL HIGH (ref 0–149)
VLDL Cholesterol Cal: 27 mg/dL (ref 5–40)

## 2022-06-14 LAB — HYPERCOAGULABLE PANEL, COMPREHENSIVE

## 2022-06-14 LAB — HGB A1C W/O EAG: Hgb A1c MFr Bld: 5.6 % (ref 4.8–5.6)

## 2022-06-14 LAB — SPECIMEN STATUS REPORT

## 2022-06-22 LAB — HYPERCOAGULABLE PANEL, COMPREHENSIVE
APTT: 26.8 s
AT III Act/Nor PPP Chro: 99 %
Act. Prt C Resist w/FV Defic.: 2.6 ratio
Anticardiolipin Ab, IgG: <10 [GPL'U]
Anticardiolipin Ab, IgM: <10 [MPL'U]
Beta-2 Glycoprotein I, IgA: <10 SAU
Beta-2 Glycoprotein I, IgG: <10 SGU
Beta-2 Glycoprotein I, IgM: <10 SMU
DRVVT Screen Seconds: 33.3 s
Factor VII Antigen**: 103 %
Factor VIII Activity: 87 %
Hexagonal Phospholipid Neutral: 3 s
Homocysteine: 9 umol/L
Prot C Ag Act/Nor PPP Imm: 67 %
Prot S Ag Act/Nor PPP Imm: 99 %
Protein C Ag/FVII Ag Ratio**: 0.7 ratio
Protein S Ag/FVII Ag Ratio**: 1 ratio

## 2022-06-24 ENCOUNTER — Ambulatory Visit
Admission: RE | Admit: 2022-06-24 | Discharge: 2022-06-24 | Disposition: A | Discharge status: Home / Self Care | Payer: Medicaid Other | Source: Ambulatory Visit | Attending: Internal Medicine | Admitting: Internal Medicine

## 2022-06-24 DIAGNOSIS — G459 Transient cerebral ischemic attack, unspecified: Secondary | ICD-10-CM

## 2022-06-24 DIAGNOSIS — R29810 Facial weakness: Secondary | ICD-10-CM

## 2022-06-24 MED ORDER — GADOBENATE DIMEGLUMINE 529 MG/ML IV SOLN
17.0000 mL | Freq: Once | INTRAVENOUS | Status: AC | PRN
  Administered 2022-06-24: 17 mL via INTRAVENOUS

## 2022-06-25 LAB — POCT WET PREP WITH KOH
KOH Prep POC: NEGATIVE
RBC Wet Prep HPF POC: NEGATIVE
Trichomonas, UA: NEGATIVE
Yeast Wet Prep HPF POC: NEGATIVE

## 2022-07-02 ENCOUNTER — Telehealth: Payer: Self-pay | Admitting: Internal Medicine

## 2022-07-02 DIAGNOSIS — R7989 Other specified abnormal findings of blood chemistry: Secondary | ICD-10-CM | POA: Insufficient documentation

## 2022-07-02 DIAGNOSIS — I10 Essential (primary) hypertension: Secondary | ICD-10-CM | POA: Insufficient documentation

## 2022-07-02 DIAGNOSIS — F4310 Post-traumatic stress disorder, unspecified: Secondary | ICD-10-CM | POA: Insufficient documentation

## 2022-08-07 ENCOUNTER — Telehealth: Payer: Self-pay

## 2022-08-07 NOTE — Telephone Encounter (Signed)
/  Patient called asking for a jury duty excuse letter. 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 Patient would need the letter by 08/22/2022

## 2022-08-14 ENCOUNTER — Other Ambulatory Visit: Payer: Self-pay | Admitting: Neurological Surgery

## 2022-08-23 ENCOUNTER — Ambulatory Visit: Payer: Medicaid Other | Admitting: Internal Medicine

## 2022-08-23 ENCOUNTER — Encounter: Payer: Self-pay | Admitting: Internal Medicine

## 2022-08-23 VITALS — BP 120/86 | HR 76 | Resp 20 | Ht 64.0 in | Wt 177.0 lb

## 2022-08-23 DIAGNOSIS — K118 Other diseases of salivary glands: Secondary | ICD-10-CM

## 2022-08-23 DIAGNOSIS — G9389 Other specified disorders of brain: Secondary | ICD-10-CM

## 2022-08-23 DIAGNOSIS — G4700 Insomnia, unspecified: Secondary | ICD-10-CM

## 2022-08-23 DIAGNOSIS — F4310 Post-traumatic stress disorder, unspecified: Secondary | ICD-10-CM

## 2022-08-23 DIAGNOSIS — R7989 Other specified abnormal findings of blood chemistry: Secondary | ICD-10-CM

## 2022-08-23 DIAGNOSIS — I10 Essential (primary) hypertension: Secondary | ICD-10-CM

## 2022-08-23 DIAGNOSIS — F32A Depression, unspecified: Secondary | ICD-10-CM

## 2022-08-23 DIAGNOSIS — R29810 Facial weakness: Secondary | ICD-10-CM | POA: Diagnosis not present

## 2022-08-23 DIAGNOSIS — E7800 Pure hypercholesterolemia, unspecified: Secondary | ICD-10-CM

## 2022-08-23 MED ORDER — SERTRALINE HCL 100 MG PO TABS: ORAL_TABLET | ORAL | 11 refills | Status: DC

## 2022-08-23 NOTE — Patient Instructions (Addendum)
[facility] [address]  [place], [place] [address] [phone] 40% Discount for [facility] Patients   Melatonin for sleep   [facility] [place], [place] [address]  [phone] Hours of Operation Mondays to Thursdays: 8 am to 8 pm Fridays: 9 am to 8 pm Saturdays: 9 am to 1 pm Sundays: Closed   Call report to [facility] in 2 weeks after increasing Sertraline to 100 mg daily (take 2 of the 50 mg tabs and then switch to 1 tab when you fill the new prescription of 100 mg tabs)

## 2022-08-23 NOTE — Progress Notes (Signed)
Subjective:    Patient ID: [name], female   DOB: 25-Aug-1971, 51 y.o.   MRN: [id number]   HPI   Left facial weakness, concern for TIA and right frontal meningioma:  Pt. Seen by Dr. Ellene Route, Neurosurgery and after discussion of options, has elected to move forward with excision of meningioma.  She states he has told her he feels the meningioma caused her temporary left facial weakness.  She did not call and set up an appt with Dr. Terrence Dupont as recommended (cardiology)  Her carotid dopplers were not of optimal quality, but nothing to suggest source of emboli or significant stenosis.  Hypercoaguable work up was negative.    2.  Hyperlipidemia:  Not as physically active as she was last fall--more recently, she has had left knee pain and followed up with ortho.  Told she may have a torn meniscus and has an MR planned of knee tomorrow.   She is actually a few pounds lighter than last fall when cholesterol panel was much better.   Lipid Panel     Component Value Date/Time   CHOL 217 (H) 06/11/2022 1116   TRIG 153 (H) 06/11/2022 1116   HDL 57 06/11/2022 1116   LDLCALC 133 (H) 06/11/2022 1116   LDLDIRECT 69 03/28/2010 2118   LABVLDL 27 06/11/2022 1116     3.  Depression:  Taking Sertraline 50 mg daily.  Only using Nortriptyline as needed.  Helps with sleep, but she feels very hot for hours.  Starts about 30 minutes after taking.  Makes her very uncomfortable.  History is hard to understand as she states she falls asleep better, but is uncomfortable for hours with the sensation of being hot all over.   States she never heard from psych.  Dr Toy Care no longer at [facility], but thought we had asked for another provider there.  She cannot clarify whether she in actuality did have an appt with them.  States she had a virtual appt with someone, but did not go back.   She still feels depressed.  No thoughts of suicide lately.  Discussed we do have counseling here, but feel she needs  psychiatric specialty care for medication management.    4.  Mildly elevated creatinine with blood draw in July.      Current Meds  Medication Sig   acetaminophen (TYLENOL) 500 MG tablet Take 500 mg by mouth every 6 (six) hours as needed.   amLODipine (NORVASC) 5 MG tablet 1 tab by mouth daily   aspirin EC 81 MG tablet Take 1 tablet (81 mg total) by mouth daily. Swallow whole.   fluticasone (FLONASE) 50 MCG/ACT nasal spray Use 2 spray(s) in each nostril once daily   Multiple Vitamin (MULTIVITAMIN WITH MINERALS) TABS Take 1 tablet by mouth daily.   nitroGLYCERIN (NITROSTAT) 0.4 MG SL tablet nitroglycerin 0.4 mg sublingual tablet  USE AS DIRECTED   nortriptyline (PAMELOR) 10 MG capsule 1 cap by mouth daily at bedtime   omeprazole (PRILOSEC) 40 MG capsule Take 1 capsule (40 mg total) by mouth daily.   sertraline (ZOLOFT) 50 MG tablet Take 1 tablet (50 mg total) by mouth daily. Cancel the Citalopram   No Known Allergies   Review of Systems    Objective:   BP 120/86 (BP Location: Left Arm, Patient Position: Sitting, Cuff Size: Normal)   Pulse 76   Resp 20   Ht '5\' 4"'$  (1.626 m)   Wt 177 lb (80.3 kg)   BMI 30.38  kg/m   Physical Exam NAD HEENT:  PERRL, EOMI Chest:  CTA CV:  RRR without murmur or rub. Radial and DP pulses normal and equal Neuro:  A & O x 3, CN II-XII grossly intact.  Motor 5/5, DTRs 2+/4.  Gait normal.   Assessment & Plan    Left facial weakness with initial concern for possible TIA, though MR of head showed right frontal Meningioma.  Do not have Dr. Clarice Pole notes as of yet, but plan is for excision of meningioma on the 28th of this month.  Will send for his notes.  So far work up to explore increased risk for thrombosis/emboli negative.  Would still like her to see Dr. 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   Called his office and found  out they are aware of her current status and have been trying to reach her for an appt, but she is not responding.  Will reach out to her again and let her know she really needs to get in with Dr. Terrence Dupont.    2.  Depression/insomnia/PTSD:  not tolerating any of low dose anti depressants for sleep aid.  Will send her to [facility] to see if Melatonin helpful.  Ultimately, found out she just does not want to go to [facility] as "crazy people"  there.  Will look into psychiatrists who take Medicaid as feel she at this point needs specialized medication management.  Increase Sertraline also to 100 mg daily.  Progress report in 2 weeks by phone.  3.  Hypertension:  controlled  4.  Hyperlipidemia:  up significantly.  Encouraged her to work on diet and physical activity.  With her knee issues, to get into a pool for physical activity. [facility] for low cost pool access.    5.  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  Asked her to make sure she speaks with Dr, Ellene Route as to whether she will be good to travel for this appt after surgery end of Sept.    6.  Mildly elevated creatinine:  recheck today.

## 2022-08-24 LAB — BASIC METABOLIC PANEL
BUN/Creatinine Ratio: 17 (ref 9–23)
BUN: 18 mg/dL (ref 6–24)
CO2: 23 mmol/L (ref 20–29)
Calcium: 9.8 mg/dL (ref 8.7–10.2)
Chloride: 105 mmol/L (ref 96–106)
Creatinine, Ser: 1.06 mg/dL — ABNORMAL HIGH (ref 0.57–1.00)
Glucose: 86 mg/dL (ref 70–99)
Potassium: 4.2 mmol/L (ref 3.5–5.2)
Sodium: 143 mmol/L (ref 134–144)
eGFR: 63 mL/min/{1.73_m2} (ref >59)

## 2022-08-28 NOTE — Pre-Procedure Instructions (Signed)
Surgical Instructions    Your procedure is scheduled on September 06, 2022.  Report to [facility] Main Entrance "A" at 5:30 A.M., then check in with the Admitting office.  Call this number if you have problems the morning of surgery:  [phone]   If you have any questions prior to your surgery date call [phone]: Open Monday-Friday 8am-4pm    Remember:  Do not eat after midnight the night before your surgery  You may drink clear liquids until 4:30 AM the morning of your surgery.   Clear liquids allowed are: Water, Non-Citrus Juices (without pulp), Carbonated Beverages, Clear Tea, Black Coffee Only (NO MILK, CREAM OR POWDERED CREAMER of any kind), and Gatorade.     Take these medicines the morning of surgery with A SIP OF WATER:  amLODipine (NORVASC)   fluticasone (FLONASE)   omeprazole (PRILOSEC)   sertraline (ZOLOFT)   Take these medicines the morning of surgery AS NEEDED:  acetaminophen (TYLENOL)  nitroGLYCERIN (NITROSTAT)    Follow your surgeon's instructions on when to stop Aspirin.  If no instructions were given by your surgeon then you will need to call the office to get those instructions.     As of today, STOP taking any Aleve, Naproxen, Ibuprofen, Motrin, Advil, Goody's, BC's, all herbal medications, fish oil, and all vitamins.                     Do NOT Smoke (Tobacco/Vaping) for 24 hours prior to your procedure.  If you use a CPAP at night, you may bring your mask/headgear for your overnight stay.   Contacts, glasses, piercing's, hearing aid's, dentures or partials may not be worn into surgery, please bring cases for these belongings.    For patients admitted to the hospital, discharge time will be determined by your treatment team.   Patients discharged the day of surgery will not be allowed to drive home, and someone needs to stay with them for 24 hours.  SURGICAL WAITING ROOM VISITATION Patients having surgery or a procedure may have no more than 2  support people in the waiting area - these visitors may rotate.   Children under the age of 39 must have an adult with them who is not the patient. If the patient needs to stay at the hospital during part of their recovery, the visitor guidelines for inpatient rooms apply. Pre-op nurse will coordinate an appropriate time for 1 support person to accompany patient in pre-op.  This support person may not rotate.   Please refer to the [facility] website for the visitor guidelines for Inpatients (after your surgery is over and you are in a regular room).    Special instructions:   [name]- Preparing For Surgery  Before surgery, you can play an important role. Because skin is not sterile, your skin needs to be as free of germs as possible. You can reduce the number of germs on your skin by washing with CHG (chlorahexidine gluconate) Soap before surgery.  CHG is an antiseptic cleaner which kills germs and bonds with the skin to continue killing germs even after washing.    Oral Hygiene is also important to reduce your risk of infection.  Remember - BRUSH YOUR TEETH THE MORNING OF SURGERY WITH YOUR REGULAR TOOTHPASTE  Please do not use if you have an allergy to CHG or antibacterial soaps. If your skin becomes reddened/irritated stop using the CHG.  Do not shave (including legs and underarms) for at least 48 hours prior  to first CHG shower. It is OK to shave your face.  Please follow these instructions carefully.   Shower the NIGHT BEFORE SURGERY and the MORNING OF SURGERY  If you chose to wash your hair, wash your hair first as usual with your normal shampoo.  After you shampoo, rinse your hair and body thoroughly to remove the shampoo.  Use CHG Soap as you would any other liquid soap. You can apply CHG directly to the skin and wash gently with a scrungie or a clean washcloth.   Apply the CHG Soap to your body ONLY FROM THE NECK DOWN.  Do not use on open wounds or open sores. Avoid contact  with your eyes, ears, mouth and genitals (private parts). Wash Face and genitals (private parts)  with your normal soap.   Wash thoroughly, paying special attention to the area where your surgery will be performed.  Thoroughly rinse your body with warm water from the neck down.  DO NOT shower/wash with your normal soap after using and rinsing off the CHG Soap.  Pat yourself dry with a CLEAN TOWEL.  Wear CLEAN PAJAMAS to bed the night before surgery  Place CLEAN SHEETS on your bed the night before your surgery  DO NOT SLEEP WITH PETS.   Day of Surgery: Take a shower with CHG soap. Do not wear jewelry or makeup Do not wear lotions, powders, perfumes/colognes, or deodorant. Do not shave 48 hours prior to surgery.   Do not bring valuables to the hospital.  [facility] is not responsible for any belongings or valuables. Do not wear nail polish, gel polish, artificial nails, or any other type of covering on natural nails (fingers and toes) If you have artificial nails or gel coating that need to be removed by a nail salon, please have this removed prior to surgery. Artificial nails or gel coating may interfere with anesthesia's ability to adequately monitor your vital signs.  Wear Clean/Comfortable clothing the morning of surgery Remember to brush your teeth WITH YOUR REGULAR TOOTHPASTE.   Please read over the following fact sheets that you were given.    If you received a COVID test during your pre-op visit  it is requested that you wear a mask when out in public, stay away from anyone that may not be feeling well and notify your surgeon if you develop symptoms. If you have been in contact with anyone that has tested positive in the last 10 days please notify you surgeon.

## 2022-08-29 ENCOUNTER — Encounter (HOSPITAL_COMMUNITY): Payer: Self-pay

## 2022-08-29 ENCOUNTER — Other Ambulatory Visit: Payer: Self-pay

## 2022-08-29 ENCOUNTER — Encounter (HOSPITAL_COMMUNITY)
Admission: RE | Admit: 2022-08-29 | Discharge: 2022-08-29 | Disposition: A | Discharge status: Home / Self Care | Payer: Medicaid Other | Source: Ambulatory Visit | Attending: Neurological Surgery | Admitting: Neurological Surgery

## 2022-08-29 VITALS — BP 133/79 | HR 75 | Temp 98.4°F | Resp 17 | Ht 62.0 in | Wt 179.5 lb

## 2022-08-29 DIAGNOSIS — Z01818 Encounter for other preprocedural examination: Secondary | ICD-10-CM | POA: Insufficient documentation

## 2022-08-29 DIAGNOSIS — I10 Essential (primary) hypertension: Secondary | ICD-10-CM | POA: Diagnosis not present

## 2022-08-29 HISTORY — DX: Headache, unspecified: R51.9

## 2022-08-29 LAB — CBC
HCT: 40.1 % (ref 36.0–46.0)
Hemoglobin: 12.8 g/dL (ref 12.0–15.0)
MCH: 30.5 pg (ref 26.0–34.0)
MCHC: 31.9 g/dL (ref 30.0–36.0)
MCV: 95.5 fL (ref 80.0–100.0)
Platelets: 297 10*3/uL (ref 150–400)
RBC: 4.2 MIL/uL (ref 3.87–5.11)
RDW: 14.6 % (ref 11.5–15.5)
WBC: 7.6 10*3/uL (ref 4.0–10.5)
nRBC: 0 % (ref 0.0–0.2)

## 2022-08-29 LAB — TYPE AND SCREEN
ABO/RH(D): O POS
Antibody Screen: NEGATIVE

## 2022-08-29 NOTE — Progress Notes (Signed)
PCP - Dr. Mack Hook  Cardiologist - Dr. Terrence Dupont (Pt states she just sees cardiology as needed, not routinely). During appt, I called Dr. Zenia Resides office to find out last date of EKG. They informed me that they've been trying to reach the pt to schedule an appt. Pt is scheduled to see Dr. Terrence Dupont on 9/21 at 2:45 PM. Records requested.  PPM/ICD - denies   Chest x-ray - 03/08/20 EKG - 08/29/22 Stress Test - 10+ years ago per pt ECHO - 05/09/10 Cardiac Cath - 07/01/2003  Sleep Study - denies  DM- denies  Blood Thinner Instructions: n/a Aspirin Instructions: hold 7 days  ERAS Protcol - yes, no drink   COVID TEST- n/a   Anesthesia review: yes, cardiac hx  Patient denies shortness of breath, fever, cough and chest pain at PAT appointment   All instructions explained to the patient, with a verbal understanding of the material. Patient agrees to go over the instructions while at home for a better understanding.  The opportunity to ask questions was provided.

## 2022-08-30 NOTE — Progress Notes (Signed)
Anesthesia Chart Review:  Patient had cardiac catheterization by Dr. Terrence Dupont July 2004 for further evaluation after abnormal stress test.  Per cath report, "The LV showed mild global hypokinesia, EF of 50% approximately. The left main was patent.  LAD was patent.  Diagonal #1 and 2 were very small which were patent.  Ramus was medium size which was patent.  Left circumflex was patent.  OM-1 was less than 0.5 mm which was very, very small vessel which was diffusely diseased.  OM-2 was moderate and patent.  RCA was patent."  Patient was last seen by Dr. Terrence Dupont 08/30/2022 for preop evaluation.  Per note, "patient is acceptable risk for brain surgery from cardiac point of view."  Recent transient episode of left facial and upper extremity weakness June 2023.  Carotid Dopplers unremarkable.  MRI brain showed 2.2 cm extra-axial mass along the anterior right frontal convexity, most likely partially calcified meningioma.  Patient was referred to neurosurgery for further evaluation.  BMP from 08/23/2022 reviewed, unremarkable.  CBC from 08/29/2022 reviewed, WNL.  EKG 08/29/2022: NSR.  Rate 63.  Carotid ultrasound 06/14/2022: RIGHT CAROTID ARTERY: There is no grayscale evidence of significant intimal thickening or atherosclerotic plaque affecting the interrogated portions of the right carotid system. There are no elevated peak systolic velocities within the interrogated course of the right internal carotid artery to suggest a hemodynamically significant stenosis.   RIGHT VERTEBRAL ARTERY:  Antegrade Flow   LEFT CAROTID ARTERY: There is no grayscale evidence of significant intimal thickening or atherosclerotic plaque affecting the interrogated portions of the left carotid system. There are no elevated peak systolic velocities within the interrogated course of the left internal carotid artery to suggest a hemodynamically significant stenosis.   LEFT VERTEBRAL ARTERY:  Antegrade flow   Upper extremity  blood pressures: RIGHT: 134/68 LEFT: 147/62   IMPRESSION: Unremarkable carotid Doppler ultrasound.   Wynonia Musty [facility]/Anesthesiology Phone [phone] 08/31/2022 3:18 PM

## 2022-08-31 NOTE — Anesthesia Preprocedure Evaluation (Signed)
Anesthesia Evaluation   Patient awake    Reviewed: Allergy & Precautions, NPO status , Patient's Chart, lab work & pertinent test results  History of Anesthesia Complications Negative for: history of anesthetic complications  Airway Mallampati: II  TM Distance: >3 FB Neck ROM: Full    Dental  (+) Teeth Intact, Dental Advisory Given,    Pulmonary neg shortness of breath, neg COPD, neg recent URI, former smoker,    breath sounds clear to auscultation       Cardiovascular hypertension, + CAD and + Past MI   Rhythm:Regular     Neuro/Psych  Headaches, PSYCHIATRIC DISORDERS Anxiety Depression Cerebral meningioma  Neuromuscular disease    GI/Hepatic Neg liver ROS, GERD  Medicated and Controlled,  Endo/Other  negative endocrine ROS  Renal/GU negative Renal ROS     Musculoskeletal negative musculoskeletal ROS (+)   Abdominal   Peds  Hematology negative hematology ROS (+) Lab Results      Component                Value               Date                      WBC                      7.6                 08/29/2022                HGB                      12.8                08/29/2022                HCT                      40.1                08/29/2022                MCV                      95.5                08/29/2022                PLT                      297                 08/29/2022              Anesthesia Other Findings Patient had cardiac catheterization by Dr. Terrence Dupont July 2004 for further evaluation after abnormal stress test.  Per cath report, "The LV showed mild global hypokinesia, EF of 50% approximately.The left main was patent. LAD was patent. Diagonal #1 and 2 were verysmall which were patent. Ramus was medium size which was patent. Leftcircumflex was patent. OM-1 was less than 0.5 mm which was very, very smallvessel which was diffusely diseased. OM-2 was moderate and patent. RCA  waspatent."  Patient was last seen by Dr. Terrence Dupont 08/30/2022 for preop evaluation.  Per note, "patient is acceptable risk for brain surgery from cardiac point of view."  Recent transient  episode of left facial and upper extremity weakness June 2023.  Carotid Dopplers unremarkable.  MRI brain showed 2.2 cm extra-axial mass along the anterior right frontal convexity, most likely partially calcified meningioma.  Patient was referred to neurosurgery for further evaluation.  Reproductive/Obstetrics                            Anesthesia Physical Anesthesia Plan  ASA: 3  Anesthesia Plan: General   Post-op Pain Management: Ofirmev IV (intra-op)*   Induction: Intravenous  PONV Risk Score and Plan: 3 and Ondansetron and Dexamethasone  Airway Management Planned: Oral ETT  Additional Equipment: Arterial line  Intra-op Plan:   Post-operative Plan: Extubation in OR  Informed Consent: I have reviewed the patients History and Physical, chart, labs and discussed the procedure including the risks, benefits and alternatives for the proposed anesthesia with the patient or authorized representative who has indicated his/her understanding and acceptance.     Dental advisory given  Plan Discussed with: CRNA  Anesthesia Plan Comments: (PAT note by Karoline Caldwell, PA-C: Patient had cardiac catheterization by Dr. Terrence Dupont July 2004 for further evaluation after abnormal stress test.  Per cath report, "The LV showed mild global hypokinesia, EF of 50% approximately.The left main was patent. LAD was patent. Diagonal #1 and 2 were verysmall which were patent. Ramus was medium size which was patent. Leftcircumflex was patent. OM-1 was less than 0.5 mm which was very, very smallvessel which was diffusely diseased. OM-2 was moderate and patent. RCA waspatent."  Patient was last seen by Dr. Terrence Dupont 08/30/2022 for preop evaluation.  Per note, "patient is acceptable risk for brain  surgery from cardiac point of view."  Recent transient episode of left facial and upper extremity weakness June 2023.  Carotid Dopplers unremarkable.  MRI brain showed 2.2 cm extra-axial mass along the anterior right frontal convexity, most likely partially calcified meningioma.  Patient was referred to neurosurgery for further evaluation.  BMP from 08/23/2022 reviewed, unremarkable.  CBC from 08/29/2022 reviewed, WNL.  EKG 08/29/2022: NSR.  Rate 63.  Carotid ultrasound 06/14/2022: RIGHT CAROTID ARTERY: There is no grayscale evidence of significant intimal thickening or atherosclerotic plaque affecting the interrogated portions of the right carotid system. There are no elevated peak systolic velocities within the interrogated course of the right internal carotid artery to suggest a hemodynamically significant stenosis.  RIGHT VERTEBRAL ARTERY: Antegrade Flow  LEFT CAROTID ARTERY: There is no grayscale evidence of significant intimal thickening or atherosclerotic plaque affecting the interrogated portions of the left carotid system. There are no elevated peak systolic velocities within the interrogated course of the left internal carotid artery to suggest a hemodynamically significant stenosis.  LEFT VERTEBRAL ARTERY: Antegrade flow  Upper extremity blood pressures: RIGHT: 134/68 LEFT: 147/62  IMPRESSION: Unremarkable carotid Doppler ultrasound.  )       Anesthesia Quick Evaluation

## 2022-09-06 ENCOUNTER — Inpatient Hospital Stay (HOSPITAL_COMMUNITY): Payer: Medicaid Other | Admitting: Certified Registered Nurse Anesthetist

## 2022-09-06 ENCOUNTER — Encounter (HOSPITAL_COMMUNITY)
Admission: RE | Disposition: A | Discharge status: Home / Self Care | Payer: Self-pay | Source: Home / Self Care | Attending: Neurological Surgery

## 2022-09-06 ENCOUNTER — Inpatient Hospital Stay (HOSPITAL_COMMUNITY)
Admission: RE | Admit: 2022-09-06 | Discharge: 2022-09-08 | DRG: 027 | Disposition: A | Discharge status: Home / Self Care | Payer: Medicaid Other | Attending: Neurological Surgery | Admitting: Neurological Surgery

## 2022-09-06 ENCOUNTER — Encounter (HOSPITAL_COMMUNITY): Payer: Self-pay | Admitting: Neurological Surgery

## 2022-09-06 ENCOUNTER — Other Ambulatory Visit: Payer: Self-pay

## 2022-09-06 ENCOUNTER — Inpatient Hospital Stay (HOSPITAL_COMMUNITY): Payer: Medicaid Other | Admitting: Physician Assistant

## 2022-09-06 DIAGNOSIS — E039 Hypothyroidism, unspecified: Secondary | ICD-10-CM | POA: Diagnosis present

## 2022-09-06 DIAGNOSIS — F32A Depression, unspecified: Secondary | ICD-10-CM | POA: Diagnosis present

## 2022-09-06 DIAGNOSIS — Z862 Personal history of diseases of the blood and blood-forming organs and certain disorders involving the immune mechanism: Secondary | ICD-10-CM

## 2022-09-06 DIAGNOSIS — I252 Old myocardial infarction: Secondary | ICD-10-CM | POA: Diagnosis not present

## 2022-09-06 DIAGNOSIS — F419 Anxiety disorder, unspecified: Secondary | ICD-10-CM | POA: Diagnosis present

## 2022-09-06 DIAGNOSIS — D320 Benign neoplasm of cerebral meninges: Principal | ICD-10-CM | POA: Diagnosis present

## 2022-09-06 DIAGNOSIS — D329 Benign neoplasm of meninges, unspecified: Principal | ICD-10-CM | POA: Diagnosis present

## 2022-09-06 DIAGNOSIS — Z8673 Personal history of transient ischemic attack (TIA), and cerebral infarction without residual deficits: Secondary | ICD-10-CM

## 2022-09-06 DIAGNOSIS — I2510 Atherosclerotic heart disease of native coronary artery without angina pectoris: Secondary | ICD-10-CM | POA: Diagnosis present

## 2022-09-06 DIAGNOSIS — K589 Irritable bowel syndrome without diarrhea: Secondary | ICD-10-CM | POA: Diagnosis present

## 2022-09-06 DIAGNOSIS — K219 Gastro-esophageal reflux disease without esophagitis: Secondary | ICD-10-CM | POA: Diagnosis present

## 2022-09-06 DIAGNOSIS — Z87891 Personal history of nicotine dependence: Secondary | ICD-10-CM | POA: Diagnosis not present

## 2022-09-06 DIAGNOSIS — I10 Essential (primary) hypertension: Secondary | ICD-10-CM | POA: Diagnosis present

## 2022-09-06 DIAGNOSIS — Z7982 Long term (current) use of aspirin: Secondary | ICD-10-CM

## 2022-09-06 DIAGNOSIS — Z79899 Other long term (current) drug therapy: Secondary | ICD-10-CM | POA: Diagnosis not present

## 2022-09-06 HISTORY — DX: Benign neoplasm of cerebral meninges: D32.0

## 2022-09-06 HISTORY — PX: CRANIOTOMY: SHX93

## 2022-09-06 HISTORY — PX: APPLICATION OF CRANIAL NAVIGATION: SHX6578

## 2022-09-06 LAB — MRSA NEXT GEN BY PCR, NASAL: MRSA by PCR Next Gen: NOT DETECTED

## 2022-09-06 SURGERY — CRANIOTOMY TUMOR EXCISION
Anesthesia: General | Laterality: Right

## 2022-09-06 MED ORDER — THROMBIN 20000 UNITS EX SOLR
CUTANEOUS | Status: DC | PRN
  Administered 2022-09-06: 20 mL via TOPICAL

## 2022-09-06 MED ORDER — ONDANSETRON HCL 4 MG/2ML IJ SOLN: 4.0000 mg | INTRAMUSCULAR | Status: DC | PRN

## 2022-09-06 MED ORDER — BUPIVACAINE HCL (PF) 0.5 % IJ SOLN
INTRAMUSCULAR | Status: DC | PRN
  Administered 2022-09-06: 8 mL

## 2022-09-06 MED ORDER — OXYCODONE HCL 5 MG PO TABS: 5.0000 mg | ORAL_TABLET | Freq: Once | ORAL | Status: DC | PRN

## 2022-09-06 MED ORDER — MORPHINE SULFATE (PF) 2 MG/ML IV SOLN
1.0000 mg | INTRAVENOUS | Status: DC | PRN
  Administered 2022-09-06 – 2022-09-08 (×10): 2 mg via INTRAVENOUS
  Filled 2022-09-06 (×10): qty 1

## 2022-09-06 MED ORDER — THROMBIN 20000 UNITS EX SOLR
CUTANEOUS | Status: AC
  Filled 2022-09-06: qty 20000

## 2022-09-06 MED ORDER — PANTOPRAZOLE SODIUM 40 MG IV SOLR: 40.0000 mg | Freq: Every day | INTRAVENOUS | Status: DC

## 2022-09-06 MED ORDER — SENNA 8.6 MG PO TABS
1.0000 | ORAL_TABLET | Freq: Two times a day (BID) | ORAL | Status: DC
  Administered 2022-09-07 – 2022-09-08 (×2): 8.6 mg via ORAL
  Filled 2022-09-06 (×3): qty 1

## 2022-09-06 MED ORDER — POLYETHYLENE GLYCOL 3350 17 G PO PACK: 17.0000 g | PACK | Freq: Every day | ORAL | Status: DC | PRN

## 2022-09-06 MED ORDER — SUGAMMADEX SODIUM 200 MG/2ML IV SOLN
INTRAVENOUS | Status: DC | PRN
  Administered 2022-09-06: 200 mg via INTRAVENOUS

## 2022-09-06 MED ORDER — ONDANSETRON HCL 4 MG/2ML IJ SOLN
INTRAMUSCULAR | Status: AC
  Filled 2022-09-06: qty 2

## 2022-09-06 MED ORDER — BUPIVACAINE HCL (PF) 0.5 % IJ SOLN
INTRAMUSCULAR | Status: AC
  Filled 2022-09-06: qty 30

## 2022-09-06 MED ORDER — ACETAMINOPHEN 10 MG/ML IV SOLN: 1000.0000 mg | Freq: Once | INTRAVENOUS | Status: DC | PRN

## 2022-09-06 MED ORDER — CEFAZOLIN SODIUM-DEXTROSE 2-4 GM/100ML-% IV SOLN
INTRAVENOUS | Status: AC
  Filled 2022-09-06: qty 100

## 2022-09-06 MED ORDER — AMLODIPINE BESYLATE 5 MG PO TABS
5.0000 mg | ORAL_TABLET | Freq: Every day | ORAL | Status: DC
  Administered 2022-09-06 – 2022-09-08 (×3): 5 mg via ORAL
  Filled 2022-09-06 (×3): qty 1

## 2022-09-06 MED ORDER — FENTANYL CITRATE (PF) 250 MCG/5ML IJ SOLN
INTRAMUSCULAR | Status: DC | PRN
  Administered 2022-09-06 (×5): 50 ug via INTRAVENOUS

## 2022-09-06 MED ORDER — LABETALOL HCL 5 MG/ML IV SOLN: INTRAVENOUS | Status: DC | PRN

## 2022-09-06 MED ORDER — MICROFIBRILLAR COLL HEMOSTAT EX PADS
MEDICATED_PAD | CUTANEOUS | Status: DC | PRN
  Administered 2022-09-06: 1 via TOPICAL

## 2022-09-06 MED ORDER — PANTOPRAZOLE SODIUM 40 MG PO TBEC
40.0000 mg | DELAYED_RELEASE_TABLET | Freq: Every day | ORAL | Status: DC
  Administered 2022-09-06 – 2022-09-08 (×3): 40 mg via ORAL
  Filled 2022-09-06 (×3): qty 1

## 2022-09-06 MED ORDER — ROCURONIUM BROMIDE 10 MG/ML (PF) SYRINGE
PREFILLED_SYRINGE | INTRAVENOUS | Status: AC
  Filled 2022-09-06: qty 10

## 2022-09-06 MED ORDER — THROMBIN 5000 UNITS EX SOLR
CUTANEOUS | Status: AC
  Filled 2022-09-06: qty 5000

## 2022-09-06 MED ORDER — LABETALOL HCL 5 MG/ML IV SOLN: 10.0000 mg | INTRAVENOUS | Status: DC | PRN

## 2022-09-06 MED ORDER — NITROGLYCERIN 0.4 MG SL SUBL: 0.4000 mg | SUBLINGUAL_TABLET | SUBLINGUAL | Status: DC | PRN

## 2022-09-06 MED ORDER — PROMETHAZINE HCL 25 MG PO TABS: 12.5000 mg | ORAL_TABLET | ORAL | Status: DC | PRN

## 2022-09-06 MED ORDER — CHLORHEXIDINE GLUCONATE CLOTH 2 % EX PADS
6.0000 | MEDICATED_PAD | Freq: Every day | CUTANEOUS | Status: DC
  Administered 2022-09-07 – 2022-09-08 (×2): 6 via TOPICAL

## 2022-09-06 MED ORDER — ONDANSETRON HCL 4 MG/2ML IJ SOLN
INTRAMUSCULAR | Status: DC | PRN
  Administered 2022-09-06: 4 mg via INTRAVENOUS

## 2022-09-06 MED ORDER — ONDANSETRON HCL 4 MG PO TABS: 4.0000 mg | ORAL_TABLET | ORAL | Status: DC | PRN

## 2022-09-06 MED ORDER — CHLORHEXIDINE GLUCONATE 0.12 % MT SOLN: 15.0000 mL | Freq: Once | OROMUCOSAL | Status: AC

## 2022-09-06 MED ORDER — SODIUM CHLORIDE 0.9 % IV SOLN: INTRAVENOUS | Status: DC | PRN

## 2022-09-06 MED ORDER — GLYCOPYRROLATE 0.2 MG/ML IJ SOLN
INTRAMUSCULAR | Status: DC | PRN
  Administered 2022-09-06: .1 mg via INTRAVENOUS

## 2022-09-06 MED ORDER — DEXAMETHASONE SODIUM PHOSPHATE 10 MG/ML IJ SOLN
INTRAMUSCULAR | Status: AC
  Filled 2022-09-06: qty 1

## 2022-09-06 MED ORDER — LIDOCAINE-EPINEPHRINE 1 %-1:100000 IJ SOLN
INTRAMUSCULAR | Status: AC
  Filled 2022-09-06: qty 1

## 2022-09-06 MED ORDER — ACETAMINOPHEN 500 MG PO TABS: 1000.0000 mg | ORAL_TABLET | Freq: Four times a day (QID) | ORAL | Status: DC | PRN

## 2022-09-06 MED ORDER — THROMBIN 5000 UNITS EX SOLR
OROMUCOSAL | Status: DC | PRN
  Administered 2022-09-06: 5 mL via TOPICAL

## 2022-09-06 MED ORDER — BISACODYL 10 MG RE SUPP: 10.0000 mg | Freq: Every day | RECTAL | Status: DC | PRN

## 2022-09-06 MED ORDER — 0.9 % SODIUM CHLORIDE (POUR BTL) OPTIME
TOPICAL | Status: DC | PRN
  Administered 2022-09-06: 3000 mL

## 2022-09-06 MED ORDER — HYDROMORPHONE HCL 1 MG/ML IJ SOLN
0.5000 mg | Freq: Once | INTRAMUSCULAR | Status: AC
  Administered 2022-09-06: 0.5 mg via INTRAVENOUS
  Filled 2022-09-06: qty 1

## 2022-09-06 MED ORDER — MIDAZOLAM HCL 2 MG/2ML IJ SOLN
INTRAMUSCULAR | Status: AC
  Filled 2022-09-06: qty 2

## 2022-09-06 MED ORDER — HYDROCODONE-ACETAMINOPHEN 5-325 MG PO TABS
1.0000 | ORAL_TABLET | ORAL | Status: DC | PRN
  Administered 2022-09-06 – 2022-09-08 (×11): 1 via ORAL
  Filled 2022-09-06 (×11): qty 1

## 2022-09-06 MED ORDER — ROCURONIUM BROMIDE 10 MG/ML (PF) SYRINGE
PREFILLED_SYRINGE | INTRAVENOUS | Status: DC | PRN
  Administered 2022-09-06: 30 mg via INTRAVENOUS
  Administered 2022-09-06: 10 mg via INTRAVENOUS
  Administered 2022-09-06: 70 mg via INTRAVENOUS

## 2022-09-06 MED ORDER — PHENYLEPHRINE HCL-NACL 20-0.9 MG/250ML-% IV SOLN
INTRAVENOUS | Status: DC | PRN
  Administered 2022-09-06: 80 ug via INTRAVENOUS
  Administered 2022-09-06 (×2): 40 ug via INTRAVENOUS
  Administered 2022-09-06: 40 ug/min via INTRAVENOUS
  Administered 2022-09-06: 30 ug/min via INTRAVENOUS

## 2022-09-06 MED ORDER — LACTATED RINGERS IV SOLN: INTRAVENOUS | Status: DC

## 2022-09-06 MED ORDER — MELATONIN 3 MG PO TABS
3.0000 mg | ORAL_TABLET | Freq: Every day | ORAL | Status: DC
  Administered 2022-09-06 – 2022-09-07 (×2): 3 mg via ORAL
  Filled 2022-09-06 (×2): qty 1

## 2022-09-06 MED ORDER — FENTANYL CITRATE (PF) 100 MCG/2ML IJ SOLN
INTRAMUSCULAR | Status: AC
  Filled 2022-09-06: qty 2

## 2022-09-06 MED ORDER — SODIUM CHLORIDE 0.9 % IV SOLN
0.0500 ug/kg/min | INTRAVENOUS | Status: AC
  Administered 2022-09-06: .1 ug/kg/min via INTRAVENOUS
  Filled 2022-09-06: qty 2000

## 2022-09-06 MED ORDER — FENTANYL CITRATE (PF) 100 MCG/2ML IJ SOLN
25.0000 ug | INTRAMUSCULAR | Status: DC | PRN
  Administered 2022-09-06: 25 ug via INTRAVENOUS

## 2022-09-06 MED ORDER — FLUTICASONE PROPIONATE 50 MCG/ACT NA SUSP
2.0000 | Freq: Every day | NASAL | Status: DC
  Administered 2022-09-07 – 2022-09-08 (×2): 2 via NASAL
  Filled 2022-09-06: qty 16

## 2022-09-06 MED ORDER — ORAL CARE MOUTH RINSE: 15.0000 mL | Freq: Once | OROMUCOSAL | Status: AC

## 2022-09-06 MED ORDER — ACETAMINOPHEN 10 MG/ML IV SOLN
INTRAVENOUS | Status: AC
  Filled 2022-09-06: qty 100

## 2022-09-06 MED ORDER — LIDOCAINE 2% (20 MG/ML) 5 ML SYRINGE
INTRAMUSCULAR | Status: AC
  Filled 2022-09-06: qty 5

## 2022-09-06 MED ORDER — LIDOCAINE-EPINEPHRINE 1 %-1:100000 IJ SOLN
INTRAMUSCULAR | Status: DC | PRN
  Administered 2022-09-06: 8 mL

## 2022-09-06 MED ORDER — DEXAMETHASONE SODIUM PHOSPHATE 10 MG/ML IJ SOLN
INTRAMUSCULAR | Status: DC | PRN
  Administered 2022-09-06: 10 mg via INTRAVENOUS

## 2022-09-06 MED ORDER — CHLORHEXIDINE GLUCONATE CLOTH 2 % EX PADS: 6.0000 | MEDICATED_PAD | Freq: Once | CUTANEOUS | Status: DC

## 2022-09-06 MED ORDER — SODIUM CHLORIDE 0.9 % IV SOLN
INTRAVENOUS | Status: DC | PRN
  Administered 2022-09-06: 1000 mg via INTRAVENOUS

## 2022-09-06 MED ORDER — METOPROLOL TARTRATE 5 MG/5ML IV SOLN: 2.5000 mg | Freq: Once | INTRAVENOUS | Status: DC

## 2022-09-06 MED ORDER — PROPOFOL 10 MG/ML IV BOLUS
INTRAVENOUS | Status: AC
  Filled 2022-09-06: qty 20

## 2022-09-06 MED ORDER — ACETAMINOPHEN 10 MG/ML IV SOLN
INTRAVENOUS | Status: DC | PRN
  Administered 2022-09-06: 1000 mg via INTRAVENOUS

## 2022-09-06 MED ORDER — CEFAZOLIN SODIUM-DEXTROSE 2-4 GM/100ML-% IV SOLN
2.0000 g | INTRAVENOUS | Status: AC
  Administered 2022-09-06: 2 g via INTRAVENOUS

## 2022-09-06 MED ORDER — ACETAMINOPHEN 500 MG PO TABS: 1000.0000 mg | ORAL_TABLET | Freq: Once | ORAL | Status: DC | PRN

## 2022-09-06 MED ORDER — ACETAMINOPHEN 160 MG/5ML PO SOLN: 1000.0000 mg | Freq: Once | ORAL | Status: DC | PRN

## 2022-09-06 MED ORDER — SERTRALINE HCL 50 MG PO TABS
100.0000 mg | ORAL_TABLET | Freq: Every day | ORAL | Status: DC
  Administered 2022-09-06 – 2022-09-08 (×3): 100 mg via ORAL
  Filled 2022-09-06 (×3): qty 2

## 2022-09-06 MED ORDER — FENTANYL CITRATE (PF) 250 MCG/5ML IJ SOLN
INTRAMUSCULAR | Status: AC
  Filled 2022-09-06: qty 5

## 2022-09-06 MED ORDER — EPHEDRINE SULFATE-NACL 50-0.9 MG/10ML-% IV SOSY
PREFILLED_SYRINGE | INTRAVENOUS | Status: DC | PRN
  Administered 2022-09-06 (×5): 2.5 mg via INTRAVENOUS

## 2022-09-06 MED ORDER — LEVETIRACETAM IN NACL 500 MG/100ML IV SOLN
500.0000 mg | Freq: Two times a day (BID) | INTRAVENOUS | Status: DC
  Administered 2022-09-06: 500 mg via INTRAVENOUS
  Filled 2022-09-06: qty 100

## 2022-09-06 MED ORDER — ORAL CARE MOUTH RINSE: 15.0000 mL | OROMUCOSAL | Status: DC | PRN

## 2022-09-06 MED ORDER — CHLORHEXIDINE GLUCONATE 0.12 % MT SOLN
OROMUCOSAL | Status: AC
  Administered 2022-09-06: 15 mL via OROMUCOSAL
  Filled 2022-09-06: qty 15

## 2022-09-06 MED ORDER — METOPROLOL TARTRATE 5 MG/5ML IV SOLN
INTRAVENOUS | Status: AC
  Filled 2022-09-06: qty 5

## 2022-09-06 MED ORDER — OXYCODONE HCL 5 MG/5ML PO SOLN: 5.0000 mg | Freq: Once | ORAL | Status: DC | PRN

## 2022-09-06 MED ORDER — MIDAZOLAM HCL 2 MG/2ML IJ SOLN
INTRAMUSCULAR | Status: DC | PRN
  Administered 2022-09-06: 2 mg via INTRAVENOUS

## 2022-09-06 MED ORDER — LIDOCAINE 2% (20 MG/ML) 5 ML SYRINGE
INTRAMUSCULAR | Status: DC | PRN
  Administered 2022-09-06: 4 mg via INTRAVENOUS

## 2022-09-06 MED ORDER — FLEET ENEMA 7-19 GM/118ML RE ENEM: 1.0000 | ENEMA | Freq: Once | RECTAL | Status: DC | PRN

## 2022-09-06 MED ORDER — POVIDONE-IODINE 10 % EX OINT
TOPICAL_OINTMENT | CUTANEOUS | Status: AC
  Filled 2022-09-06: qty 28.35

## 2022-09-06 MED ORDER — PROPOFOL 10 MG/ML IV BOLUS
INTRAVENOUS | Status: DC | PRN
  Administered 2022-09-06: 100 mg via INTRAVENOUS
  Administered 2022-09-06: 30 mg via INTRAVENOUS
  Administered 2022-09-06: 50 mg via INTRAVENOUS

## 2022-09-06 MED ORDER — DOCUSATE SODIUM 100 MG PO CAPS
100.0000 mg | ORAL_CAPSULE | Freq: Two times a day (BID) | ORAL | Status: DC
  Administered 2022-09-07 – 2022-09-08 (×2): 100 mg via ORAL
  Filled 2022-09-06 (×3): qty 1

## 2022-09-06 SURGICAL SUPPLY — 86 items
BAG COUNTER SPONGE SURGICOUNT (BAG) ×2 IMPLANT
BATTERY IQ STERILE (MISCELLANEOUS) ×2 IMPLANT
BIT DRILL WIRE PASS 1.3MM (BIT) IMPLANT
BLADE CLIPPER SURG (BLADE) ×6 IMPLANT
BNDG GAUZE DERMACEA FLUFF 4 (GAUZE/BANDAGES/DRESSINGS) ×2 IMPLANT
BNDG STRETCH 4X75 NS LF (GAUZE/BANDAGES/DRESSINGS) IMPLANT
BUR ACORN 6.0 PRECISION (BURR) ×2 IMPLANT
BUR SPIRAL ROUTER 2.3 (BUR) ×2 IMPLANT
CANISTER SUCT 3000ML PPV (MISCELLANEOUS) ×2 IMPLANT
CLIP VESOCCLUDE MED 6/CT (CLIP) IMPLANT
CNTNR URN SCR LID CUP LEK RST (MISCELLANEOUS) ×2 IMPLANT
CONT SPEC 4OZ STRL OR WHT (MISCELLANEOUS) ×2
COTTONBALL LRG STERILE PKG (GAUZE/BANDAGES/DRESSINGS) IMPLANT
COVER BACK TABLE 60X90IN (DRAPES) ×4 IMPLANT
COVER BURR HOLE UNIV 10 (Orthopedic Implant) IMPLANT
COVERAGE SUPPORT O-ARM STEALTH (MISCELLANEOUS) ×2 IMPLANT
DRAIN CHANNEL 10M FLAT 3/4 FLT (DRAIN) IMPLANT
DRAIN SUBARACHNOID (WOUND CARE) IMPLANT
DRAPE MICROSCOPE SLANT 54X150 (MISCELLANEOUS) IMPLANT
DRAPE SURG IRRIG POUCH 19X23 (DRAPES) IMPLANT
DRAPE WARM FLUID 44X44 (DRAPES) ×2 IMPLANT
DRILL WIRE PASS 1.3MM (BIT)
DRSG ADAPTIC 3X8 NADH LF (GAUZE/BANDAGES/DRESSINGS) ×2 IMPLANT
DRSG OPSITE POSTOP 3X4 (GAUZE/BANDAGES/DRESSINGS) IMPLANT
DRSG TELFA 3X8 NADH STRL (GAUZE/BANDAGES/DRESSINGS) IMPLANT
DURAPREP 6ML APPLICATOR 50/CS (WOUND CARE) ×2 IMPLANT
ELECT REM PT RETURN 9FT ADLT (ELECTROSURGICAL) ×2
ELECTRODE REM PT RTRN 9FT ADLT (ELECTROSURGICAL) ×2 IMPLANT
EVACUATOR 1/8 PVC DRAIN (DRAIN) IMPLANT
EVACUATOR SILICONE 100CC (DRAIN) IMPLANT
FEE COVERAGE SUPPORT O-ARM (MISCELLANEOUS) IMPLANT
FORCEPS BIPO MALIS IRRIG 9X1.5 (NEUROSURGERY SUPPLIES) IMPLANT
GAUZE 4X4 16PLY ~~LOC~~+RFID DBL (SPONGE) IMPLANT
GAUZE SPONGE 4X4 12PLY STRL (GAUZE/BANDAGES/DRESSINGS) IMPLANT
GLOVE BIOGEL PI IND STRL 7.0 (GLOVE) IMPLANT
GLOVE BIOGEL PI IND STRL 8.5 (GLOVE) ×4 IMPLANT
GLOVE ECLIPSE 8.0 STRL XLNG CF (GLOVE) ×2 IMPLANT
GLOVE ECLIPSE 8.5 STRL (GLOVE) ×2 IMPLANT
GLOVE EXAM NITRILE XL STR (GLOVE) IMPLANT
GLOVE SURG SS PI 6.5 STRL IVOR (GLOVE) IMPLANT
GLOVE SURG SS PI 7.0 STRL IVOR (GLOVE) IMPLANT
GLOVE SURG SS PI 8.0 STRL IVOR (GLOVE) IMPLANT
GLOVE SURG SS PI 8.5 STRL STRW (GLOVE) IMPLANT
GOWN STRL REUS W/ TWL LRG LVL3 (GOWN DISPOSABLE) IMPLANT
GOWN STRL REUS W/ TWL XL LVL3 (GOWN DISPOSABLE) ×2 IMPLANT
GOWN STRL REUS W/TWL 2XL LVL3 (GOWN DISPOSABLE) ×2 IMPLANT
GOWN STRL REUS W/TWL LRG LVL3 (GOWN DISPOSABLE)
GOWN STRL REUS W/TWL XL LVL3 (GOWN DISPOSABLE) ×4
GRAFT DURAGEN MATRIX 2WX2L IMPLANT
HEMOSTAT POWDER KIT SURGIFOAM (HEMOSTASIS) IMPLANT
HEMOSTAT SURGICEL 2X14 (HEMOSTASIS) IMPLANT
HOOK DURA (MISCELLANEOUS) IMPLANT
KIT BASIN OR (CUSTOM PROCEDURE TRAY) ×2 IMPLANT
KIT TURNOVER KIT B (KITS) ×2 IMPLANT
MARKER SPHERE PSV REFLC NDI (MISCELLANEOUS) ×6 IMPLANT
NEEDLE HYPO 22GX1.5 SAFETY (NEEDLE) ×2 IMPLANT
NS IRRIG 1000ML POUR BTL (IV SOLUTION) ×2 IMPLANT
PACK BATTERY CMF DISP FOR DVR (ORTHOPEDIC DISPOSABLE SUPPLIES) IMPLANT
PACK CRANIOTOMY CUSTOM (CUSTOM PROCEDURE TRAY) ×2 IMPLANT
PAD ARMBOARD 7.5X6 YLW CONV (MISCELLANEOUS) ×6 IMPLANT
PATTIES SURGICAL .25X.25 (GAUZE/BANDAGES/DRESSINGS) IMPLANT
PATTIES SURGICAL .5 X.5 (GAUZE/BANDAGES/DRESSINGS) IMPLANT
PATTIES SURGICAL .5 X3 (DISPOSABLE) IMPLANT
PATTIES SURGICAL 1/4 X 3 (GAUZE/BANDAGES/DRESSINGS) IMPLANT
PATTIES SURGICAL 1X1 (DISPOSABLE) IMPLANT
PATTIES SURGICAL 3 X3 (GAUZE/BANDAGES/DRESSINGS)
PATTIES SURGICAL 3X3 (GAUZE/BANDAGES/DRESSINGS) IMPLANT
PIN MAYFIELD SKULL DISP (PIN) IMPLANT
PLATE CRANIAL 12 2H RIGID UNI (Plate) IMPLANT
SCREW SD AXS 1.5X3 (Screw) IMPLANT
SPIKE FLUID TRANSFER (MISCELLANEOUS) ×2 IMPLANT
SPONGE NEURO XRAY DETECT 1X3 (DISPOSABLE) IMPLANT
SPONGE SURGIFOAM ABS GEL 100 (HEMOSTASIS) IMPLANT
STAPLER SKIN PROX WIDE 3.9 (STAPLE) ×2 IMPLANT
SUT ETHILON 3 0 FSL (SUTURE) IMPLANT
SUT ETHILON 4 0 PS 2 18 (SUTURE) IMPLANT
SUT NURALON 4 0 TR CR/8 (SUTURE) ×4 IMPLANT
SUT VIC AB 2-0 CP2 18 (SUTURE) ×4 IMPLANT
SUT VIC AB 4-0 RB1 18 (SUTURE) ×4 IMPLANT
TOWEL GREEN STERILE (TOWEL DISPOSABLE) ×2 IMPLANT
TOWEL GREEN STERILE FF (TOWEL DISPOSABLE) ×2 IMPLANT
TRAY FOLEY MTR SLVR 14FR STAT (SET/KITS/TRAYS/PACK) IMPLANT
TRAY FOLEY MTR SLVR 16FR STAT (SET/KITS/TRAYS/PACK) IMPLANT
TUBE CONNECTING 20X1/4 (TUBING) IMPLANT
UNDERPAD 30X36 HEAVY ABSORB (UNDERPADS AND DIAPERS) IMPLANT
WATER STERILE IRR 1000ML POUR (IV SOLUTION) ×2 IMPLANT

## 2022-09-06 NOTE — Transfer of Care (Signed)
Immediate Anesthesia Transfer of Care Note  Patient: [name]  Procedure(s) Performed: Right Frontal craniotomy for meningioma with stealth (Right) APPLICATION OF CRANIAL NAVIGATION  Patient Location: PACU  Anesthesia Type:General  Level of Consciousness: awake and alert   Airway & Oxygen Therapy: Patient Spontanous Breathing and Patient connected to face mask oxygen  Post-op Assessment: Report given to RN, Post -op Vital signs reviewed and stable, Patient moving all extremities X 4 and Patient able to stick tongue midline  Post vital signs: Reviewed and stable  Last Vitals:  Vitals Value Taken Time  BP 142/83 09/06/22 1115  Temp    Pulse 116 09/06/22 1120  Resp 17 09/06/22 1120  SpO2 96 % 09/06/22 1120  Vitals shown include unvalidated device data.  Last Pain:  Vitals:   09/06/22 0629  TempSrc: Oral  PainSc:       Patients Stated Pain Goal: 0 (77/11/65 7903)  Complications: No notable events documented.

## 2022-09-06 NOTE — Plan of Care (Signed)
  Problem: Education: Goal: Knowledge of General Education information will improve Description: Including pain rating scale, medication(s)/side effects and non-pharmacologic comfort measures Outcome: Progressing   Problem: Clinical Measurements: Goal: Will remain free from infection Outcome: Progressing   Problem: Coping: Goal: Level of anxiety will decrease Outcome: Progressing   Problem: Pain Managment: Goal: General experience of comfort will improve Outcome: Progressing   Problem: Skin Integrity: Goal: Risk for impaired skin integrity will decrease Outcome: Progressing

## 2022-09-06 NOTE — H&P (Signed)
CHIEF COMPLAINT: Right frontal meningioma.  HISTORY OF PRESENT ILLNESS: [name] is a 51 year old right-handed individual who, back in June, had a transient ischemic attack.  She notes that she had woken up and looked in the mirror and noticed that her face was drooping on the left side.  She also had some weakness in that left upper extremity.  She was seen in the emergency department, where an MRI was completed.  The MRI showed no evidence of a stroke or any acute or subacute findings, and the situation, by this time, had largely resolved itself.  The weakness seemed to last for about 45 minutes to an hour.  Vascular studies were performed, and she has been on a baby aspirin since that time, but the only abnormality noted on the MRI was the presence of a right frontal meningioma that measures 22 mm in length and about 14 mm in thickness.  I noted the findings on the MRI to Alfonzo Feller notes that she has been suffering with headaches for a long period of time, and these have certainly been worse over the last couple of months.  She was told about the meningioma, and she notes that she would like to have it out as she feels that the meningioma is related to the severity of the headaches that she is experiencing.  I noted to her that on the findings I see no reactive brain tissue, there is no edema from the meningioma.  Nonetheless, the patient is quite aware and adamant about the fact that her headaches are likely related in this right frontal region to the presence of that mass.  She tells me that about a year ago she had a traumatic incident that resulted in a laceration to the right frontal region.  I do not believe that the incident itself is likely related to the presence of the meningioma.  Nonetheless, the laceration was in the area of the right frontal lobe that would correspond to the presence in the area of the meningioma.  PAST MEDICAL HISTORY: Reveals that she has generally been healthy.   She has some hypertension.  She does take some medication for depression, but otherwise is functioning well.  She had surgery on her left knee about 3 years ago, but otherwise is in good health.  PHYSICAL EXAMINATION: I note that she stands straight and erect.  She walks without an antalgia.  She demonstrates good strength in the major groups of the upper and lower extremities with good strength in the deltoids, biceps, triceps, grips, and intrinsics, good balance and strength in the major groups of the lower extremities, including evaluation by toe and heel walking.  She has a negative Romberg's test.  There is no evidence of a cortical drift.  Her reflexes are symmetric and normal.  There is no evidence of a Babinski reflex.  IMPRESSION: The patient has evidence of a small right frontal meningioma, which she notes is quite bothersome for her.  She has been suffering with chronic headaches, and she would like to have this lesion removed.  I discussed with her the nature of the surgery.  We would like to do some stealth registration, and whether this scan can be used for that purpose I will need to determine.  It is possible she may need to have either an MRI or a CT scan to register with the stealth station.  Nonetheless, we would try to plan a small right frontal incision, scalp flap would be brought forward,  a bone flap would be raised, and the meningioma removed.  Often times, if the meningioma is attached to the dura and part of the dura has to be removed, we will use a small dural graft, which is usually some bovine pericardium.  I did tell the patient that at the time of surgery, we usually cover the patient with a seizure medication to prevent seizures.  This usually is withdrawn about a month or so after the surgery, but during that time, she should refrain from driving.  At the current time, I do not see any reason that she could not have the surgery in light of the fact that she has been suffering  with significant headaches.  We can plan on scheduling this at the earliest convenience for her.

## 2022-09-06 NOTE — Anesthesia Postprocedure Evaluation (Signed)
Anesthesia Post Note  Patient: [name]  Procedure(s) Performed: Right Frontal craniotomy for meningioma with stealth (Right) [place]     Patient location during evaluation: PACU Anesthesia Type: General Level of consciousness: awake and alert Pain management: pain level controlled Vital Signs Assessment: post-procedure vital signs reviewed and stable Respiratory status: spontaneous breathing, nonlabored ventilation, respiratory function stable and patient connected to nasal cannula oxygen Cardiovascular status: blood pressure returned to baseline and stable Postop Assessment: no apparent nausea or vomiting Anesthetic complications: no   No notable events documented.  Last Vitals:  Vitals:   09/06/22 1145 09/06/22 1200  BP: 134/81 136/81  Pulse: 95 97  Resp: 10 13  Temp:    SpO2: 95% 99%    Last Pain:  Vitals:   09/06/22 1200  TempSrc:   PainSc: Asleep                 Maxwell Martorano

## 2022-09-06 NOTE — Op Note (Signed)
Date of surgery: 09/06/2022 Preoperative diagnosis: Right frontal meningioma Postoperative diagnosis: Same Procedure: Right frontal craniotomy for resection of meningioma with Stealth guidance Surgeon: Kristeen Miss First Assistant: Earle Gell Anesthesia: General endotracheal Indications: [name] is a 51 year old right-handed individual who has had significant headaches at work-up included a CT scan which demonstrated presence of a small right frontal meningioma.  Advised regarding the significance of this but because of its size advised simply observing it a few months went by and she apparently had an episode of near syncope with some left-sided weakness and MRI was repeated and this suggested slight increase in size of her meningioma.  Because of her headaches and chronic symptoms she desires to have the lesion removed.  Procedure: Patient was brought to the operating room supine on the stretcher.  After the smooth induction of general tracheal anesthesia she had an arterial line Foley catheter placed.  She was then placed in the 3 pin headrest with the head slightly turned to the left.  The Stealth registration frame was applied and registration of the patient's head was obtained with the MRI scan.  This allowed [place] to trace the silhouette of the tumor on the surface of the scalp so as to localize to craniotomy in the best position.  Once this was performed some hair in the right frontal region was shaved the scalp was prepped with alcohol DuraPrep and draped in a sterile fashion.  Then a standard right frontal curvilinear incision was created bringing a portion of the incision down onto the forehead and extending down to the front of the tragus of the ear.  The dissection was then taken down to the galea and Raney clips were used to secure hemostasis in the galea.  Several points of bleeding were cauterized with bipolar cautery scalp was brought forward and the temporalis fascia was then  incised and also brought forward and the superior anterior aspect all the way to the level of the keyhole finally on the right side.  Then the aperture was read traced with the self to allow placement of the bone flap and the most Diel region.  A singular bur hole was placed posteriorly through a rather thickened and discolored cranium.  The router was then used to remove a right frontal bone flap which clearly had an imprint and invasion of the tumor on the undersurface of it.  This was laid aside.  Tumor was noted to be protruding through the dura itself in this region.  Then a portion of the dura was isolated and opened and Metzenbaum scissors were used to remove a portion of the dura with the tumor included.  The tumor dissected cell free from the cortical surface without too much difficulty.  Some attachments to the superficial veins were cauterized and divided.  With this the tumor was resected with the meningioma attached to it.  The meningeal edges were then cauterized for hemostasis and once all bleeding was stopped from the meningeal edges a 2 inch x 2 inch sheet of DuraGen was placed into the dural opening.  This filled the defect very nicely.  With this tack up sutures were placed around the periphery of the dura.  The bone flap was then replaced and secured with 2 dog bone plates and a singular bur hole cover.  The galea was then replaced with the temporalis fascia being closed with individual 2-0 Vicryl sutures the galea was closed with 2-0 Vicryl and the scalp was closed with a 4-0 running  nylon suture.  Blood loss for the procedure was estimated at 125 cc.  Patient was then returned to the recovery room in stable condition after having a dry sterile dressing applied along with a Curlex wrap.

## 2022-09-06 NOTE — Anesthesia Procedure Notes (Signed)
Arterial Line Insertion Start/End9/28/2023 8:17 AM Performed by: Janene Harvey, CRNA, CRNA  Patient location: OR. Preanesthetic checklist: patient identified, IV checked, risks and benefits discussed and monitors and equipment checked Patient sedated Left, radial was placed Catheter size: 20 G Hand hygiene performed  and maximum sterile barriers used   Attempts: 1 Procedure performed without using ultrasound guided technique. Following insertion, dressing applied and Biopatch. Post procedure assessment: unchanged  Patient tolerated the procedure well with no immediate complications.

## 2022-09-06 NOTE — Anesthesia Procedure Notes (Signed)
Procedure Name: Intubation Date/Time: 09/06/2022 8:10 AM  Performed by: Leonor Liv, CRNAPre-anesthesia Checklist: Patient identified, Emergency Drugs available, Suction available and Patient being monitored Patient Re-evaluated:Patient Re-evaluated prior to induction Oxygen Delivery Method: Circle System Utilized Preoxygenation: Pre-oxygenation with 100% oxygen Induction Type: IV induction Ventilation: Mask ventilation without difficulty and Oral airway inserted - appropriate to patient size Laryngoscope Size: Mac and 3 Grade View: Grade I Tube type: Oral Tube size: 7.0 mm Number of attempts: 1 Airway Equipment and Method: Stylet and Oral airway Placement Confirmation: ETT inserted through vocal cords under direct vision, positive ETCO2 and breath sounds checked- equal and bilateral Secured at: 21 cm Tube secured with: Tape (left) Dental Injury: Teeth and Oropharynx as per pre-operative assessment

## 2022-09-06 NOTE — Progress Notes (Signed)
Patient ID: [name], female   DOB: 04-05-1971, 51 y.o.   MRN: [id number] Vital signs are stable Patient is awake alert arouses and moves all 4 extremities well She complains of some headache Dressing is clean and dry I will be out of the office tomorrow but have asked Dr. Earle Gell to see the patient for me and likely discharge her tomorrow if all stable.  I will follow-up with her on an outpatient basis early next week.

## 2022-09-07 LAB — SURGICAL PATHOLOGY

## 2022-09-07 MED ORDER — LEVETIRACETAM 500 MG PO TABS
500.0000 mg | ORAL_TABLET | Freq: Two times a day (BID) | ORAL | Status: DC
  Administered 2022-09-07 – 2022-09-08 (×3): 500 mg via ORAL
  Filled 2022-09-07 (×5): qty 1

## 2022-09-07 NOTE — Progress Notes (Signed)
Subjective: The patient is alert and pleasant.  She complains of a headache.  She has not walked yet.  The Foley catheter still in.  Objective: Vital signs in last 24 hours: Temp:  [97.7 F (36.5 C)-98.3 F (36.8 C)] 98.3 F (36.8 C) (09/29 0400) Pulse Rate:  [65-124] 71 (09/29 0500) Resp:  [10-20] 13 (09/29 0500) BP: (96-142)/(60-97) 110/67 (09/29 0500) SpO2:  [95 %-100 %] 98 % (09/29 0500) Arterial Line BP: (186)/(86) 186/86 (09/28 1115) Estimated body mass index is 32.37 kg/m as calculated from the following:   Height as of this encounter: '5\' 2"'$  (1.575 m).   Weight as of this encounter: 80.3 kg.   Intake/Output from previous day: 09/28 0701 - 09/29 0700 In: 1861.5 [P.O.:150; I.V.:1500; IV Piggyback:211.5] Out: 2350 [Urine:2250; Blood:100] Intake/Output this shift: Total I/O In: 1.5 [IV Piggyback:1.5] Out: 150 [Urine:150]  Physical exam patient is alert and oriented.  Her strength and speech is normal.  Her dressing is clean and dry.  Lab Results: No results for input(s): "WBC", "HGB", "HCT", "PLT" in the last 72 hours. BMET No results for input(s): "NA", "K", "CL", "CO2", "GLUCOSE", "BUN", "CREATININE", "CALCIUM" in the last 72 hours.  Studies/Results: No results found.  Assessment/Plan: Postop day #1: She is doing well neurologically.  We will DC her Foley catheter and mobilize her.  She may go home later on today on Keppra.  I gave her her discharge instructions and answered all her questions.  He is to follow-up with Dr. Ellene Route next week to have her sutures removed.  LOS: 1 day     Ophelia Charter 09/07/2022, 6:39 AM

## 2022-09-07 NOTE — Plan of Care (Signed)
  Problem: Education: Goal: Knowledge of General Education information will improve Description: Including pain rating scale, medication(s)/side effects and non-pharmacologic comfort measures Outcome: Progressing   Problem: Health Behavior/Discharge Planning: Goal: Ability to manage health-related needs will improve Outcome: Progressing   Problem: Clinical Measurements: Goal: Ability to maintain clinical measurements within normal limits will improve Outcome: Progressing   Problem: Activity: Goal: Risk for activity intolerance will decrease Outcome: Progressing   Problem: Elimination: Goal: Will not experience complications related to bowel motility Outcome: Progressing   Problem: Safety: Goal: Ability to remain free from injury will improve Outcome: Progressing   Problem: Skin Integrity: Goal: Risk for impaired skin integrity will decrease Outcome: Progressing

## 2022-09-08 MED ORDER — LEVETIRACETAM 500 MG PO TABS: 500.0000 mg | ORAL_TABLET | Freq: Two times a day (BID) | ORAL | 0 refills | Status: DC

## 2022-09-08 MED ORDER — HYDROCODONE-ACETAMINOPHEN 5-325 MG PO TABS: 1.0000 | ORAL_TABLET | ORAL | 0 refills | Status: DC | PRN

## 2022-09-08 NOTE — Discharge Summary (Signed)
Physician Discharge Summary  Patient ID: [name] MRN: [id number] DOB/AGE: 1971/06/17 51 y.o.  Admit date: 09/06/2022 Discharge date: 09/08/2022  Admission Diagnoses:Meningioma  Discharge Diagnoses:  Principal Problem:   Meningioma [facility]) Active Problems:   Meningioma, cerebral [facility])   Discharged Condition: good  Hospital Course: [name] presented with a meningioma. She was admitted to the hospital for tumor resection. A right frontal craniotomy was performed and the tumor resection was felt to be gross total. Post op she is alert, oriented x 4, with clear and fluent speech. She is moving all extremities well. Wound is clean, dry, without signs of infection  Treatments: surgery: Right frontal craniotomy for resection of meningioma with Stealth guidance  Discharge Exam: Blood pressure 124/71, pulse 80, temperature 98.7 F (37.1 C), resp. rate 11, height '5\' 2"'$  (1.575 m), weight 80.3 kg, last menstrual period 09/25/2020, SpO2 97 %. General appearance: alert, cooperative, appears stated age, and mild distress  Disposition: Discharge disposition: 01-Home or Self Care      Cerebral meningioma  Allergies as of 09/08/2022   No Known Allergies      Medication List     TAKE these medications    acetaminophen 500 MG tablet Commonly known as: TYLENOL Take 1,000 mg by mouth every 6 (six) hours as needed for headache.   amLODipine 5 MG tablet Commonly known as: NORVASC 1 tab by mouth daily   aspirin EC 81 MG tablet Take 1 tablet (81 mg total) by mouth daily. Swallow whole.   fluticasone 50 MCG/ACT nasal spray Commonly known as: FLONASE Use 2 spray(s) in each nostril once daily What changed:  how much to take how to take this when to take this additional instructions   HYDROcodone-acetaminophen 5-325 MG tablet Commonly known as: NORCO/VICODIN Take 1 tablet by mouth every 4 (four) hours as needed for moderate pain.   levETIRAcetam 500 MG  tablet Commonly known as: KEPPRA Take 1 tablet (500 mg total) by mouth 2 (two) times daily.   MELATONIN PO Take 1.5 mg by mouth at bedtime.   multivitamin with minerals Tabs tablet Take 1 tablet by mouth daily.   nitroGLYCERIN 0.4 MG SL tablet Commonly known as: NITROSTAT Place 0.4 mg under the tongue every 5 (five) minutes as needed.   omeprazole 40 MG capsule Commonly known as: PRILOSEC Take 1 capsule (40 mg total) by mouth daily.   sertraline 100 MG tablet Commonly known as: ZOLOFT 1 tab by mouth daily What changed:  how much to take how to take this when to take this additional instructions        Follow-up Information     Kristeen Miss, MD Follow up.   Specialty: Neurosurgery Why: call to make an appointment for staple removal 10 days Contact information: [address] [place] [address] [place] [address] [phone]                 Signed: Ashok Pall 09/08/2022, 1:17 PM

## 2022-09-08 NOTE — Discharge Instructions (Signed)
Craniotomy °Care After °Please read the instructions outlined below and refer to this sheet in the next few weeks. These discharge instructions provide you with general information on caring for yourself after you leave the hospital. Your surgeon may also give you specific instructions. While your treatment has been planned according to the most current medical practices available, unavoidable complications occasionally occur. If you have any problems or questions after discharge, please call your surgeon. °Although there are many types of brain surgery, recovery following craniotomy (surgical opening of the skull) is much the same for each. However, recovery depends on many factors. These include the type and severity of brain injury and the type of surgery. It also depends on any nervous system function problems (neurological deficits) before surgery. If the craniotomy was done for cancer, chemotherapy and radiation could follow. You could be in the hospital from 5 days to a couple weeks. This depends on the type of surgery, findings, and whether there are complications. °HOME CARE INSTRUCTIONS  °· It is not unusual to hear a clicking noise after a craniotomy, the plates and screws used to attach the bone flap can sometimes cause this. It is a normal occurrence if this does happen °· Do not drive for 10 days after the operation °· Your scalp may feel spongy for a while, because of fluid under it. This will gradually get better. Occasionally, the surgeon will not replace the bone that was removed to access the brain. If there is a bony defect, the surgeon will ask you to wear a helmet for protection. This is a discussion you should have with your surgeon prior to leaving the hospital (discharge). °· Numbness may persist in some areas of your scalp. °· Take all medications as directed. Sometimes steroids to control swelling are prescribed. Anticonvulsants to prevent seizures may also be given. Do not use alcohol,  other drugs, or medications unless your surgeon says it is OK. °· Keep the wound dry and clean. The wound may be washed gently with soap and water. Then, you may gently blot or dab it dry, without rubbing. Do not take baths, use swimming pools or hot tubs for 10 days, or as instructed by your caregiver. It is best to wait to see you surgeon at your first postoperative visit, and to get directions at that time. °· Only take over-the-counter or prescription medicines for pain, discomfort, or fever as directed by your caregiver. °· You may continue your normal diet, as directed. °· Walking is OK for exercise. Wait at least 3 months before you return to mild, non-contact sports or as your surgeon suggests. Contact sports should be avoided for at least 1 year, unless your surgeon says it is OK. °· If you are prescribed steroids, take them exactly as prescribed. If you start having a decrease in nervous system functions (neurological deficits) and headaches as the dose of steroids is reduced, tell your surgeon right away. °· When the anticonvulsant prescription is finished you no longer need to take it. °SEEK IMMEDIATE MEDICAL CARE IF:  °· You develop nausea, vomiting, severe headaches, confusion, or you have a seizure. °· You develop chest pain, a stiff neck, or difficulty breathing. °· There is redness, swelling, or increasing pain in the wound or pin insertion sites. °· You have an increase in swelling or bruising around the eyes. °· There is drainage or pus coming from the wound. °· You have an oral temperature above 102° F (38.9° C), not controlled by medicine. °·   You notice a foul smell coming from the wound or dressing. °· The wound breaks open (edges not staying together) after the stitches have been removed. °· You develop dizziness or fainting while standing. °· You develop a rash. °· You develop any reaction or side effects to the medications given. °Document Released: 02/26/2006 Document Revised: 02/18/2012  Document Reviewed: 12/05/2009 °[employer]® Patient Information ©2013 [employer]. ° °

## 2022-09-08 NOTE — Progress Notes (Signed)
  Transition of Care [facility]) Screening Note   Patient Details  Name: [name] Date of Birth: [birth year]   Transition of Care [facility]) CM/SW Contact:    Bartholomew Crews, RN Phone Number: [phone] 09/08/2022, 4:20 PM    Transition of Care Department [facility]) has reviewed patient and no TOC needs have been identified at this time. We will continue to monitor patient advancement through interdisciplinary progression rounds. If new patient transition needs arise, please place a TOC consult.

## 2022-09-10 ENCOUNTER — Encounter (HOSPITAL_COMMUNITY): Payer: Self-pay | Admitting: Neurological Surgery

## 2022-10-09 ENCOUNTER — Other Ambulatory Visit: Payer: Self-pay | Admitting: Neurological Surgery

## 2022-10-09 DIAGNOSIS — D320 Benign neoplasm of cerebral meninges: Secondary | ICD-10-CM

## 2022-11-06 ENCOUNTER — Ambulatory Visit
Admission: RE | Admit: 2022-11-06 | Discharge: 2022-11-06 | Disposition: A | Discharge status: Home / Self Care | Payer: Medicaid Other | Source: Ambulatory Visit | Attending: Neurological Surgery | Admitting: Neurological Surgery

## 2022-11-06 DIAGNOSIS — D320 Benign neoplasm of cerebral meninges: Secondary | ICD-10-CM

## 2022-11-06 MED ORDER — GADOPICLENOL 0.5 MMOL/ML IV SOLN
10.0000 mL | Freq: Once | INTRAVENOUS | Status: AC | PRN
  Administered 2022-11-06: 10 mL via INTRAVENOUS

## 2022-12-26 NOTE — Telephone Encounter (Signed)
Closing note

## 2022-12-27 ENCOUNTER — Ambulatory Visit: Payer: Medicaid Other | Admitting: Internal Medicine

## 2022-12-27 ENCOUNTER — Encounter: Payer: Self-pay | Admitting: Internal Medicine

## 2022-12-27 VITALS — BP 128/82 | HR 84 | Resp 24 | Ht 64.0 in | Wt 175.5 lb

## 2022-12-27 DIAGNOSIS — I10 Essential (primary) hypertension: Secondary | ICD-10-CM

## 2022-12-27 DIAGNOSIS — F4310 Post-traumatic stress disorder, unspecified: Secondary | ICD-10-CM | POA: Diagnosis not present

## 2022-12-27 DIAGNOSIS — D329 Benign neoplasm of meninges, unspecified: Secondary | ICD-10-CM | POA: Diagnosis not present

## 2022-12-27 DIAGNOSIS — F32A Depression, unspecified: Secondary | ICD-10-CM

## 2022-12-27 MED ORDER — GABAPENTIN 300 MG PO CAPS: 300.0000 mg | ORAL_CAPSULE | Freq: Every day | ORAL | 2 refills | Status: DC

## 2022-12-27 NOTE — Progress Notes (Signed)
Subjective:    Patient ID: [name], female   DOB: 22-Jan-1971, 52 y.o.   MRN: [id number]   HPI   Right frontal Menigioma:  Had surgical excision with Dr. Ellene Route, Neurosurgery.  Removed in total.  Still having some headaches.  When last saw Dr. Ellene Route end of December, she had tapered her gabapentin from 300 mg 3 times daily to just once in the evening and subsequently stopped the gabapentin at his recommendation.  In past couple of weeks, she has started back with daily headaches.  Lingering throbbing bifrontal headache with occasional sudden short lived sharp pain in bifrontal area.  She has enough gabapentin at home for about a month to take once daily.  2.  Depression:  Has not been able to get an appt set up with psych as they called when she was recovering from brain surgery.  She called back to try and get set up 2 weeks ago, but has not heard back.  Checked in her chart and the referral was closed in November.   She continues to also have PTSD symptoms/anxiety, which she finds equally as challenging as the depressive symptoms. Denies knowledge of treatment with something like EMDR in past with our counselors.   No thoughts of suicide.  At times, though just feels like she does not want to be here. Lives with her 81 yo daughter.   3.  Left knee pain:  getting ready to start PT for her knee pain with ortho again, which will get her out of the house.    4.  Right sided Parotid mass:  She put off surgery for this as she healed from craniotomy.  She plans to call and get this set back up.  Current Meds  Medication Sig   acetaminophen (TYLENOL) 500 MG tablet Take 1,000 mg by mouth every 6 (six) hours as needed for headache.   amLODipine (NORVASC) 5 MG tablet 1 tab by mouth daily   fluticasone (FLONASE) 50 MCG/ACT nasal spray Use 2 spray(s) in each nostril once daily   MELATONIN PO Take 1.5 mg by mouth at bedtime.   Multiple Vitamin (MULTIVITAMIN WITH MINERALS) TABS Take 1  tablet by mouth daily.   nitroGLYCERIN (NITROSTAT) 0.4 MG SL tablet Place 0.4 mg under the tongue every 5 (five) minutes as needed.   omeprazole (PRILOSEC) 40 MG capsule Take 1 capsule (40 mg total) by mouth daily.   sertraline (ZOLOFT) 100 MG tablet 1 tab by mouth daily   No Known Allergies   Review of Systems    Objective:   BP 128/82 (BP Location: Right Arm, Patient Position: Sitting, Cuff Size: Normal)   Pulse 84   Resp (!) 24   Ht '5\' 4"'$  (1.626 m)   Wt 175 lb 8 oz (79.6 kg) Comment: with Ugg boots on.  LMP 09/25/2020 (Approximate)   BMI 30.12 kg/m   Physical Exam NAD HEENT:  PERRL, EOMI.  Surgical scar, mid forehead very thin and difficult to see, well healed.  TMs pearly gray. Neck:  Supple, No adenopathy Chest:  CTA CV:  RRR without murmur or rub.  Radial pulses normal and equal Neuro:  A & O x 3, CN II-XII grossly intact, Motor 5/5 throughout.j    Assessment & Plan    Right Frontal Meningioma:  residual HA.  She will restart the gabapentin at 300 mg at bedtime.  Refills sent.  Will see how she is doing in 2 months and if HAs resolved, will gradually  wean Gabapentin.  Follow up MR of brain showed possible small meningioma in left frontal area, reportedly there with previous MR and not reported previouisly.  Pt. Aware.  2.  Depression/anxiety/PTSD:  Re refer to Dr. Dwyane Dee.  Wondering if she would benefit from something such as EMDR with PTSD.  Continue current dose of Sertraline for now. Gave her goal of getting up with her daughter every morning and having some sort of breakfast with her.   Next goal would be to start walking 5 minutes every morning with her female friend, who makes her feel safe, and gradually increase to 60 minutes of walking as long as knee tolerates.  3.  Hypertension:  controlled.

## 2023-01-23 ENCOUNTER — Telehealth: Payer: Self-pay | Admitting: Psychology

## 2023-02-19 ENCOUNTER — Other Ambulatory Visit: Payer: Self-pay | Admitting: Internal Medicine

## 2023-02-26 ENCOUNTER — Ambulatory Visit: Payer: Medicaid Other | Admitting: Internal Medicine

## 2023-02-27 ENCOUNTER — Ambulatory Visit: Payer: Medicaid Other | Admitting: Internal Medicine

## 2023-03-04 ENCOUNTER — Ambulatory Visit (INDEPENDENT_AMBULATORY_CARE_PROVIDER_SITE_OTHER): Payer: Medicaid Other | Admitting: Clinical

## 2023-03-04 DIAGNOSIS — F332 Major depressive disorder, recurrent severe without psychotic features: Secondary | ICD-10-CM | POA: Diagnosis not present

## 2023-03-04 DIAGNOSIS — F4310 Post-traumatic stress disorder, unspecified: Secondary | ICD-10-CM
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
## 2023-03-05 ENCOUNTER — Ambulatory Visit: Payer: Medicaid Other | Admitting: Internal Medicine

## 2023-03-05 ENCOUNTER — Encounter: Payer: Self-pay | Admitting: Internal Medicine

## 2023-03-05 VITALS — BP 130/86 | HR 80 | Resp 20 | Ht 64.0 in | Wt 176.0 lb

## 2023-03-05 DIAGNOSIS — R519 Headache, unspecified: Secondary | ICD-10-CM

## 2023-03-05 DIAGNOSIS — I10 Essential (primary) hypertension: Secondary | ICD-10-CM | POA: Diagnosis not present

## 2023-03-05 DIAGNOSIS — G8929 Other chronic pain: Secondary | ICD-10-CM

## 2023-03-05 DIAGNOSIS — J3089 Other allergic rhinitis: Secondary | ICD-10-CM

## 2023-03-05 DIAGNOSIS — F32A Depression, unspecified: Secondary | ICD-10-CM | POA: Diagnosis not present

## 2023-03-05 DIAGNOSIS — K118 Other diseases of salivary glands: Secondary | ICD-10-CM | POA: Diagnosis not present

## 2023-03-05 MED ORDER — TOPIRAMATE 50 MG PO TABS: ORAL_TABLET | ORAL | 11 refills | Status: DC

## 2023-03-05 MED ORDER — FLUTICASONE PROPIONATE 50 MCG/ACT NA SUSP: NASAL | 10 refills | Status: AC

## 2023-03-05 NOTE — Progress Notes (Signed)
Subjective:    Patient ID: [name], female   DOB: Feb 13, 1971, 52 y.o.   MRN: [id number]   HPI   Depression:  Just set up with [facility], Lorenda Peck, for 04/04/23.  Continues on Sertraline.  Does get up some mornings--perhaps 2-3 times per week, and has breakfast with her daughter.  She is walking about 15 minutes once weekly.  2.  Headaches:  Increased her gabapentin to 3 times daily since last seen.  States she was taking that dosage for 3 days and then just started taking the gabapentin every 2 hours if her headache did not resolve.  Took perhaps up to 8-10 caps in 24 hours.  Stopped taking the gabapentin last week after seeing Dr. Ellene Route.  She states she went to see Dr. Ellene Route last week and he reportedly felt she has allergies/sinus symptoms.  She started Zyrtec 10 mg daily since last week.  She does not feel this has made any difference for her.  She does not feel bring off the Gabapentin has made the headache any worse.   Does note when she is up and about her headache worsens.   Pain starts at left temple and then radiates across frontal head to right side.   Pain is a pounding.   Pain never resolves, though she is able to sleep most nights.   No vision loss or changes.  No shoulder or pelvic girdle stiffness or pain. No aura. Not clear if nauseated with the headache.   +photo and phonophobia. Again, HA started in January after coming off Gabapentin.  Intracranial surgery occurred in September. She is having runny nose, maxillary sinus pressure, nasal stuffiness, itchy nose with burning, watering eyes.  No throat symptoms.   The latter symptoms above have improved with Zyrtec, but has not helped the headache.    3.  Parotid gland mass on right;  plans for surgery coming up with Dr. Constance Holster, ENT.    Current Meds  Medication Sig   acetaminophen (TYLENOL) 500 MG tablet Take 1,000 mg by mouth every 6 (six) hours as needed for headache.   amLODipine (NORVASC) 5 MG  tablet Take 1 tablet by mouth once daily   fluticasone (FLONASE) 50 MCG/ACT nasal spray Use 2 spray(s) in each nostril once daily   MELATONIN PO Take 1.5 mg by mouth at bedtime.   Multiple Vitamin (MULTIVITAMIN WITH MINERALS) TABS Take 1 tablet by mouth daily.   nitroGLYCERIN (NITROSTAT) 0.4 MG SL tablet Place 0.4 mg under the tongue every 5 (five) minutes as needed.   sertraline (ZOLOFT) 100 MG tablet 1 tab by mouth daily   No Known Allergies   Review of Systems    Objective:   BP 130/86 (BP Location: Left Arm, Patient Position: Sitting, Cuff Size: Normal)   Pulse 80   Resp 20   Ht 5\' 4"  (1.626 m)   Wt 176 lb (79.8 kg)   LMP 09/25/2020 (Approximate)   BMI 30.21 kg/m   Physical Exam NAD HEENT:  PERRL, EOMI, discs sharp, TMs pearly gray, nasal mucosa boggy and with clear discharge--left greater than right Throat without injection.  NT over maxillary and frontal sinus area.  Temporal arteries without thickening and good pulses. Neck:  Supple, no adenopathy Chest:  CTA CV:  RRR with normal S1 and S2, no S3, S4 or murmur.  No carotid bruits.  Carotid, radial and DP pulses normal and equal LE:  No edema Neuro:  A & O x 3, CN  II-XII grossly intact Motor 5/5, Gait normal.   Assessment & Plan   HAs:  seem more migrainous in nature.  Discussed if try Gabapentin again, not to use as needed as medication does not work that way.  As gabapentin did not seem to work as well as before, will try Topamax 50 mg daily and increase to 100 mg in 1 week if no improvement.  Follow up in 2 months.  2.  Depression:    encouraged making goals for daily walk as discussed last visit.  3.  Hypertension:  controlled  4.  HM:  Encouraged shingrix and COvID 19 booster--she would like to wait on these for now.  We have Shingrix and she should obtain COVID booster at pharmacy or PHD.    5.  Right parotid gland mass:  as per Dr. Rose Fillers in near future.  6.  Depression:  getting set up with [facility].

## 2023-03-05 NOTE — Patient Instructions (Signed)
Work on goals for walking daily please

## 2023-03-11 ENCOUNTER — Other Ambulatory Visit: Payer: Self-pay | Admitting: Neurological Surgery

## 2023-03-11 DIAGNOSIS — R519 Headache, unspecified: Secondary | ICD-10-CM

## 2023-03-12 ENCOUNTER — Telehealth: Payer: Self-pay

## 2023-03-12 NOTE — Telephone Encounter (Signed)
Patient called to report that she had diarrhea a few days after starting topamax. She was only taking 1 tab by mouth at bedtime. Diarrhea got progressively worse over time. She decided to stop medication on 03/10/23. Side effect stopped today 03/12/23.

## 2023-03-26 ENCOUNTER — Other Ambulatory Visit: Payer: Self-pay

## 2023-03-26 ENCOUNTER — Encounter (HOSPITAL_BASED_OUTPATIENT_CLINIC_OR_DEPARTMENT_OTHER): Payer: Self-pay | Admitting: Otolaryngology

## 2023-03-28 NOTE — H&P (Signed)
HPI:  [name] is a 52 y.o. female who presents as a consult Patient.  Referring Provider: System, Prov Not In  Chief complaint: Facial mass.  HPI: For about 4 5 years she has had a small lump along the right jawline. She thinks it may have gotten a little bit larger in this past year. She has no drainage, pain or any other symptoms related to it. She is recovering from recent neurologic surgery about 2 weeks ago and doing well.  PMH/Meds/All/SocHx/FamHx/ROS:  History reviewed. No pertinent past medical history.  Past Surgical History: Procedure Laterality Date  HAND SURGERY Right 03/28/2017 ulnar shortening  KNEE SURGERY  WRIST SURGERY Right 03/28/2017 ulnar shortening  No family history of bleeding disorders, wound healing problems or difficulty with anesthesia.  Social History  Socioeconomic History  Marital status: Widowed Spouse name: Not on file  Number of children: Not on file  Years of education: Not on file  Highest education level: Not on file Occupational History  Not on file Tobacco Use  Smoking status: Never  Smokeless tobacco: Never Substance and Sexual Activity  Alcohol use: Yes  Drug use: No  Sexual activity: Not on file Other Topics Concern  Not on file Social History Narrative  Not on file  Social Determinants of Health  Financial Resource Strain: Not on file Food Insecurity: Not on file Transportation Needs: Not on file Physical Activity: Not on file Stress: Not on file Social Connections: Not on file Housing Stability: Not on file  Current Outpatient Medications:  citalopram (CELEXA) 10 MG tablet, Take 1 tablet (10 mg total) by mouth daily., Disp: , Rfl:  FLUoxetine (PROZAC) 20 MG capsule, Take by mouth., Disp: , Rfl:  fluticasone (FLONASE) 50 mcg/actuation nasal spray, Administer 2 sprays into affected nostril., Disp: , Rfl:  HYDROcodone-acetaminophen (NORCO) 5-325 mg per tablet, Take 1 tablet by mouth every 6 (six) hours as  needed for Pain., Disp: , Rfl:  multivitamin,tx-minerals (VITAMINS AND MINERALS) Tab, Take 1 tablet by mouth., Disp: , Rfl:  metroNIDAZOLE (FLAGYL) 500 MG tablet, Take by mouth., Disp: , Rfl:  metroNIDAZOLE (METROGEL) 0.75 % vaginal gel, 1 applicatorful intravaginally twice weekly for 6 months, Disp: , Rfl:  norethindrone-e.estradiol-iron 1 mg-20 mcg (24)/75 mg (4) Tab, , Disp: , Rfl:  OUTPATIENT CUSTOM MEDICATION, Topical nonsteroidal, Disp: 1 each, Rfl: 1  A complete ROS was performed with pertinent positives/negatives noted in the HPI. The remainder of the ROS are negative.   Physical Exam:  Temp 97.8 F (36.6 C)  Ht 1.575 m ( )  Wt 81.6 kg (180 lb)  LMP 07/30/2017  BMI 32.92 kg/m  General: Healthy and alert, in no distress, breathing easily. Normal affect. In a pleasant mood. Head: Normocephalic, atraumatic. No masses, or scars. Eyes: Pupils are equal, and reactive to light. Vision is grossly intact. No spontaneous or gaze nystagmus. Ears: Ear canals are clear. Tympanic membranes are intact, with normal landmarks and the middle ears are clear and healthy. Hearing: Grossly normal. Nose: Nasal cavities are clear with healthy mucosa, no polyps or exudate. Airways are patent. Face: There is a 2-1/2 cm soft mass adjacent to the right mandibular ramus. I can palpate it from intraorally as well. It does not appear to be adhering to the dermis. No other masses or scars, facial nerve function is symmetric. Oral Cavity: No mucosal abnormalities are noted. Tongue with normal mobility. Dentition appears healthy. Oropharynx: Tonsils are symmetric. There are no mucosal masses identified. Tongue base appears normal and healthy. Larynx/Hypopharynx: deferred  Chest: Deferred Neck: No palpable masses, no cervical adenopathy, no thyroid nodules or enlargement. Neuro: Cranial nerves II-XII with normal function. Balance: Normal gate. Other findings: none.  Independent Review of Additional Tests  or Records: none  Procedures: none  Impression & Plans: Lump adjacent to the right mandible. This could be an inflamed lymph node, a salivary neoplasm, a cyst. The fact that its been here for several years without much change is reassuring. If she decides she wants to have this removed I think it can be done successfully through the mouth so that we can avoid an external scar. We did discuss the possibility of lower lip weakness. She does not want to do anything right now because of recovering from her recent brain surgery but she will contact us when she is ready to do this.

## 2023-03-31 NOTE — Anesthesia Preprocedure Evaluation (Signed)
Anesthesia Evaluation  Patient identified by MRN, date of birth, ID band Patient awake    Reviewed: Allergy & Precautions, NPO status , Patient's Chart, lab work & pertinent test results  History of Anesthesia Complications Negative for: history of anesthetic complications  Airway Mallampati: I  TM Distance: >3 FB Neck ROM: Full    Dental no notable dental hx.    Pulmonary former smoker   Pulmonary exam normal        Cardiovascular hypertension, Pt. on medications Normal cardiovascular exam     Neuro/Psych  Headaches  Anxiety Depression       GI/Hepatic Neg liver ROS,GERD  Medicated,,  Endo/Other  Hypothyroidism    Renal/GU negative Renal ROS     Musculoskeletal negative musculoskeletal ROS (+)    Abdominal   Peds  Hematology negative hematology ROS (+)   Anesthesia Other Findings Facial cyst  Reproductive/Obstetrics                             Anesthesia Physical Anesthesia Plan  ASA: 2  Anesthesia Plan: General   Post-op Pain Management: Tylenol PO (pre-op)*   Induction: Intravenous  PONV Risk Score and Plan: 3 and Treatment may vary due to age or medical condition, Midazolam, Dexamethasone and Ondansetron  Airway Management Planned: Oral ETT  Additional Equipment: None  Intra-op Plan:   Post-operative Plan: Extubation in OR  Informed Consent: I have reviewed the patients History and Physical, chart, labs and discussed the procedure including the risks, benefits and alternatives for the proposed anesthesia with the patient or authorized representative who has indicated his/her understanding and acceptance.     Dental advisory given  Plan Discussed with: CRNA  Anesthesia Plan Comments:        Anesthesia Quick Evaluation

## 2023-04-01 ENCOUNTER — Ambulatory Visit (HOSPITAL_BASED_OUTPATIENT_CLINIC_OR_DEPARTMENT_OTHER): Payer: Medicaid Other | Admitting: Anesthesiology

## 2023-04-01 ENCOUNTER — Other Ambulatory Visit: Payer: Self-pay

## 2023-04-01 ENCOUNTER — Encounter (HOSPITAL_BASED_OUTPATIENT_CLINIC_OR_DEPARTMENT_OTHER): Payer: Self-pay | Admitting: Otolaryngology

## 2023-04-01 ENCOUNTER — Encounter (HOSPITAL_BASED_OUTPATIENT_CLINIC_OR_DEPARTMENT_OTHER)
Admission: RE | Disposition: A | Discharge status: Home / Self Care | Payer: Self-pay | Source: Home / Self Care | Attending: Otolaryngology

## 2023-04-01 ENCOUNTER — Ambulatory Visit (HOSPITAL_BASED_OUTPATIENT_CLINIC_OR_DEPARTMENT_OTHER)
Admission: RE | Admit: 2023-04-01 | Discharge: 2023-04-01 | Disposition: A | Discharge status: Home / Self Care | Payer: Medicaid Other | Attending: Otolaryngology | Admitting: Otolaryngology

## 2023-04-01 DIAGNOSIS — F32A Depression, unspecified: Secondary | ICD-10-CM | POA: Insufficient documentation

## 2023-04-01 DIAGNOSIS — I10 Essential (primary) hypertension: Secondary | ICD-10-CM

## 2023-04-01 DIAGNOSIS — D170 Benign lipomatous neoplasm of skin and subcutaneous tissue of head, face and neck: Secondary | ICD-10-CM | POA: Diagnosis not present

## 2023-04-01 DIAGNOSIS — R229 Localized swelling, mass and lump, unspecified: Secondary | ICD-10-CM | POA: Diagnosis present

## 2023-04-01 DIAGNOSIS — F419 Anxiety disorder, unspecified: Secondary | ICD-10-CM | POA: Diagnosis not present

## 2023-04-01 DIAGNOSIS — F418 Other specified anxiety disorders: Secondary | ICD-10-CM

## 2023-04-01 DIAGNOSIS — Z01818 Encounter for other preprocedural examination: Secondary | ICD-10-CM

## 2023-04-01 DIAGNOSIS — Z87891 Personal history of nicotine dependence: Secondary | ICD-10-CM

## 2023-04-01 DIAGNOSIS — M278 Other specified diseases of jaws: Secondary | ICD-10-CM | POA: Diagnosis not present

## 2023-04-01 HISTORY — PX: EXCISION MASS HEAD: SHX6702

## 2023-04-01 HISTORY — DX: Anxiety disorder, unspecified: F41.9

## 2023-04-01 SURGERY — EXCISION, MASS, HEAD
Anesthesia: General | Site: Mouth | Laterality: Right

## 2023-04-01 MED ORDER — AMISULPRIDE (ANTIEMETIC) 5 MG/2ML IV SOLN: 10.0000 mg | Freq: Once | INTRAVENOUS | Status: DC | PRN

## 2023-04-01 MED ORDER — OXYCODONE HCL 5 MG PO TABS
5.0000 mg | ORAL_TABLET | Freq: Once | ORAL | Status: AC | PRN
  Administered 2023-04-01: 5 mg via ORAL

## 2023-04-01 MED ORDER — ROCURONIUM BROMIDE 100 MG/10ML IV SOLN
INTRAVENOUS | Status: DC | PRN
  Administered 2023-04-01: 70 mg via INTRAVENOUS

## 2023-04-01 MED ORDER — OXYMETAZOLINE HCL 0.05 % NA SOLN
NASAL | Status: AC
  Filled 2023-04-01: qty 30

## 2023-04-01 MED ORDER — ONDANSETRON HCL 4 MG/2ML IJ SOLN
INTRAMUSCULAR | Status: AC
  Filled 2023-04-01: qty 2

## 2023-04-01 MED ORDER — ROCURONIUM BROMIDE 10 MG/ML (PF) SYRINGE
PREFILLED_SYRINGE | INTRAVENOUS | Status: AC
  Filled 2023-04-01: qty 10

## 2023-04-01 MED ORDER — HYDROCODONE-ACETAMINOPHEN 7.5-325 MG PO TABS: 1.0000 | ORAL_TABLET | Freq: Four times a day (QID) | ORAL | 0 refills | Status: DC | PRN

## 2023-04-01 MED ORDER — PHENYLEPHRINE 80 MCG/ML (10ML) SYRINGE FOR IV PUSH (FOR BLOOD PRESSURE SUPPORT)
PREFILLED_SYRINGE | INTRAVENOUS | Status: AC
  Filled 2023-04-01: qty 10

## 2023-04-01 MED ORDER — ONDANSETRON HCL 4 MG/2ML IJ SOLN
INTRAMUSCULAR | Status: DC | PRN
  Administered 2023-04-01: 4 mg via INTRAVENOUS

## 2023-04-01 MED ORDER — FENTANYL CITRATE (PF) 100 MCG/2ML IJ SOLN
INTRAMUSCULAR | Status: DC | PRN
  Administered 2023-04-01 (×2): 50 ug via INTRAVENOUS

## 2023-04-01 MED ORDER — LACTATED RINGERS IV SOLN: INTRAVENOUS | Status: DC

## 2023-04-01 MED ORDER — BUPIVACAINE-EPINEPHRINE (PF) 0.5% -1:200000 IJ SOLN
INTRAMUSCULAR | Status: AC
  Filled 2023-04-01: qty 30

## 2023-04-01 MED ORDER — SUGAMMADEX SODIUM 200 MG/2ML IV SOLN
INTRAVENOUS | Status: DC | PRN
  Administered 2023-04-01: 320 mg via INTRAVENOUS

## 2023-04-01 MED ORDER — MIDAZOLAM HCL 2 MG/2ML IJ SOLN
INTRAMUSCULAR | Status: AC
  Filled 2023-04-01: qty 2

## 2023-04-01 MED ORDER — LIDOCAINE-EPINEPHRINE 1 %-1:100000 IJ SOLN
INTRAMUSCULAR | Status: DC | PRN
  Administered 2023-04-01: 2 mL

## 2023-04-01 MED ORDER — PHENYLEPHRINE HCL (PRESSORS) 10 MG/ML IV SOLN
INTRAVENOUS | Status: DC | PRN
  Administered 2023-04-01: 80 ug via INTRAVENOUS

## 2023-04-01 MED ORDER — FENTANYL CITRATE (PF) 100 MCG/2ML IJ SOLN
INTRAMUSCULAR | Status: AC
  Filled 2023-04-01: qty 2

## 2023-04-01 MED ORDER — MIDAZOLAM HCL 5 MG/5ML IJ SOLN
INTRAMUSCULAR | Status: DC | PRN
  Administered 2023-04-01: 2 mg via INTRAVENOUS

## 2023-04-01 MED ORDER — CEPHALEXIN 500 MG PO CAPS: 500.0000 mg | ORAL_CAPSULE | Freq: Three times a day (TID) | ORAL | 0 refills | Status: DC

## 2023-04-01 MED ORDER — 0.9 % SODIUM CHLORIDE (POUR BTL) OPTIME
TOPICAL | Status: DC | PRN
  Administered 2023-04-01: 100 mL

## 2023-04-01 MED ORDER — LIDOCAINE-EPINEPHRINE 1 %-1:100000 IJ SOLN
INTRAMUSCULAR | Status: AC
  Filled 2023-04-01: qty 2

## 2023-04-01 MED ORDER — LIDOCAINE 2% (20 MG/ML) 5 ML SYRINGE
INTRAMUSCULAR | Status: AC
  Filled 2023-04-01: qty 5

## 2023-04-01 MED ORDER — LIDOCAINE HCL (CARDIAC) PF 100 MG/5ML IV SOSY
PREFILLED_SYRINGE | INTRAVENOUS | Status: DC | PRN
  Administered 2023-04-01: 60 mg via INTRAVENOUS

## 2023-04-01 MED ORDER — OXYCODONE HCL 5 MG/5ML PO SOLN: 5.0000 mg | Freq: Once | ORAL | Status: AC | PRN

## 2023-04-01 MED ORDER — BACITRACIN ZINC 500 UNIT/GM EX OINT
TOPICAL_OINTMENT | CUTANEOUS | Status: AC
  Filled 2023-04-01: qty 28.35

## 2023-04-01 MED ORDER — DEXAMETHASONE SODIUM PHOSPHATE 10 MG/ML IJ SOLN
INTRAMUSCULAR | Status: DC | PRN
  Administered 2023-04-01: 10 mg via INTRAVENOUS

## 2023-04-01 MED ORDER — BUPIVACAINE HCL (PF) 0.25 % IJ SOLN
INTRAMUSCULAR | Status: AC
  Filled 2023-04-01: qty 30

## 2023-04-01 MED ORDER — PROPOFOL 10 MG/ML IV BOLUS
INTRAVENOUS | Status: DC | PRN
  Administered 2023-04-01: 150 mg via INTRAVENOUS

## 2023-04-01 MED ORDER — FENTANYL CITRATE (PF) 100 MCG/2ML IJ SOLN
25.0000 ug | INTRAMUSCULAR | Status: DC | PRN
  Administered 2023-04-01: 25 ug via INTRAVENOUS
  Administered 2023-04-01 (×2): 50 ug via INTRAVENOUS
  Administered 2023-04-01: 25 ug via INTRAVENOUS

## 2023-04-01 MED ORDER — ONDANSETRON 4 MG PO TBDP: 4.0000 mg | ORAL_TABLET | Freq: Three times a day (TID) | ORAL | 0 refills | Status: DC | PRN

## 2023-04-01 MED ORDER — OXYCODONE HCL 5 MG PO TABS
ORAL_TABLET | ORAL | Status: AC
  Filled 2023-04-01: qty 1

## 2023-04-01 MED ORDER — ACETAMINOPHEN 500 MG PO TABS
ORAL_TABLET | ORAL | Status: AC
  Filled 2023-04-01: qty 2

## 2023-04-01 MED ORDER — ACETAMINOPHEN 500 MG PO TABS
1000.0000 mg | ORAL_TABLET | Freq: Once | ORAL | Status: AC
  Administered 2023-04-01: 1000 mg via ORAL

## 2023-04-01 SURGICAL SUPPLY — 57 items
ADH SKN CLS APL DERMABOND .7 (GAUZE/BANDAGES/DRESSINGS)
APL SKNCLS STERI-STRIP NONHPOA (GAUZE/BANDAGES/DRESSINGS)
BENZOIN TINCTURE PRP APPL 2/3 (GAUZE/BANDAGES/DRESSINGS) IMPLANT
BLADE SURG 15 STRL LF DISP TIS (BLADE) ×1 IMPLANT
BLADE SURG 15 STRL SS (BLADE) ×1
CANISTER SUCT 1200ML W/VALVE (MISCELLANEOUS) ×1 IMPLANT
CLEANER CAUTERY TIP 5X5 PAD (MISCELLANEOUS) IMPLANT
CORD BIPOLAR FORCEPS 12FT (ELECTRODE) IMPLANT
COVER BACK TABLE 60X90IN (DRAPES) ×1 IMPLANT
COVER MAYO STAND STRL (DRAPES) ×1 IMPLANT
DERMABOND ADVANCED .7 DNX12 (GAUZE/BANDAGES/DRESSINGS) IMPLANT
DRAIN PENROSE 12X.25 LTX STRL (MISCELLANEOUS) IMPLANT
DRAPE U-SHAPE 76X120 STRL (DRAPES) ×1 IMPLANT
ELECT COATED BLADE 2.86 ST (ELECTRODE) IMPLANT
ELECT NDL BLADE 2-5/6 (NEEDLE) ×1 IMPLANT
ELECT NEEDLE BLADE 2-5/6 (NEEDLE) ×1 IMPLANT
ELECT REM PT RETURN 9FT ADLT (ELECTROSURGICAL) ×1
ELECTRODE REM PT RTRN 9FT ADLT (ELECTROSURGICAL) ×1 IMPLANT
FORCEPS BIPOLAR SPETZLER 8 1.0 (NEUROSURGERY SUPPLIES) IMPLANT
GAUZE SPONGE 2X2 STRL 8-PLY (GAUZE/BANDAGES/DRESSINGS) IMPLANT
GAUZE SPONGE 4X4 12PLY STRL LF (GAUZE/BANDAGES/DRESSINGS) IMPLANT
GLOVE ECLIPSE 7.5 STRL STRAW (GLOVE) ×1 IMPLANT
GLOVE SURG SYN 8.0 (GLOVE) ×1 IMPLANT
GLOVE SURG SYN 8.0 PF PI (GLOVE) IMPLANT
GOWN STRL REUS W/ TWL LRG LVL3 (GOWN DISPOSABLE) ×1 IMPLANT
GOWN STRL REUS W/ TWL XL LVL3 (GOWN DISPOSABLE) ×1 IMPLANT
GOWN STRL REUS W/TWL LRG LVL3 (GOWN DISPOSABLE)
GOWN STRL REUS W/TWL XL LVL3 (GOWN DISPOSABLE) ×2
IV CATH 24GX3/4 RADIO (IV SOLUTION) IMPLANT
IV SET EXT 30 76VOL 4 MALE LL (IV SETS) IMPLANT
NDL HYPO 27GX1-1/4 (NEEDLE) ×1 IMPLANT
NEEDLE HYPO 27GX1-1/4 (NEEDLE) ×1 IMPLANT
NS IRRIG 1000ML POUR BTL (IV SOLUTION) ×1 IMPLANT
PACK BASIN DAY SURGERY FS (CUSTOM PROCEDURE TRAY) ×1 IMPLANT
PENCIL FOOT CONTROL (ELECTRODE) ×1 IMPLANT
SPIKE FLUID TRANSFER (MISCELLANEOUS) IMPLANT
STRIP CLOSURE SKIN 1/4X4 (GAUZE/BANDAGES/DRESSINGS) IMPLANT
SUCTION FRAZIER HANDLE 10FR (MISCELLANEOUS) ×1
SUCTION TUBE FRAZIER 10FR DISP (MISCELLANEOUS) IMPLANT
SUT CHROMIC 3 0 PS 2 (SUTURE) IMPLANT
SUT CHROMIC 4 0 P 3 18 (SUTURE) IMPLANT
SUT ETHILON 4 0 P 3 18 (SUTURE) IMPLANT
SUT NYLON ETHILON 5-0 P-3 1X18 (SUTURE) IMPLANT
SUT PLAIN 5 0 P 3 18 (SUTURE) IMPLANT
SUT SILK 4 0 TIES 17X18 (SUTURE) IMPLANT
SUT VIC AB 4-0 P-3 18XBRD (SUTURE) IMPLANT
SUT VIC AB 4-0 P3 18 (SUTURE)
SUT VIC AB 5-0 P-3 18X BRD (SUTURE) IMPLANT
SUT VIC AB 5-0 P3 18 (SUTURE)
SWAB COLLECTION DEVICE MRSA (MISCELLANEOUS) IMPLANT
SWAB CULTURE ESWAB REG 1ML (MISCELLANEOUS) IMPLANT
SYR BULB EAR ULCER 3OZ GRN STR (SYRINGE) IMPLANT
SYR CONTROL 10ML LL (SYRINGE) ×1 IMPLANT
SYR TB 1ML LL NO SAFETY (SYRINGE) IMPLANT
TOWEL GREEN STERILE FF (TOWEL DISPOSABLE) ×1 IMPLANT
TRAY DSU PREP LF (CUSTOM PROCEDURE TRAY) ×1 IMPLANT
TUBE CONNECTING 20X1/4 (TUBING) ×1 IMPLANT

## 2023-04-01 NOTE — Anesthesia Procedure Notes (Signed)
Procedure Name: Intubation Date/Time: 04/01/2023 8:49 AM  Performed by: Thornell Mule, CRNAPre-anesthesia Checklist: Patient identified, Emergency Drugs available, Suction available and Patient being monitored Patient Re-evaluated:Patient Re-evaluated prior to induction Oxygen Delivery Method: Circle system utilized Preoxygenation: Pre-oxygenation with 100% oxygen Induction Type: IV induction Ventilation: Mask ventilation without difficulty Laryngoscope Size: Miller and 3 Grade View: Grade I Tube type: Oral Tube size: 7.0 mm Number of attempts: 1 Airway Equipment and Method: Stylet and Oral airway Placement Confirmation: ETT inserted through vocal cords under direct vision, positive ETCO2 and breath sounds checked- equal and bilateral Secured at: 21 cm Tube secured with: Tape Dental Injury: Teeth and Oropharynx as per pre-operative assessment

## 2023-04-01 NOTE — Transfer of Care (Signed)
Immediate Anesthesia Transfer of Care Note  Patient: [name]  Procedure(s) Performed: EXCISION OF RIGHT MANDIBULAR CYST (Right: Mouth)  Patient Location: PACU  Anesthesia Type:General  Level of Consciousness: drowsy, patient cooperative, and responds to stimulation  Airway & Oxygen Therapy: Patient Spontanous Breathing and Patient connected to face mask oxygen  Post-op Assessment: Report given to RN and Post -op Vital signs reviewed and stable  Post vital signs: Reviewed and stable  Last Vitals:  Vitals Value Taken Time  BP 137/85 04/01/23 0938  Temp    Pulse 114 04/01/23 0938  Resp 18 04/01/23 0938  SpO2 100 % 04/01/23 0938  Vitals shown include unvalidated device data.  Last Pain:  Vitals:   04/01/23 0707  TempSrc: Oral  PainSc: 0-No pain      Patients Stated Pain Goal: 7 (04/01/23 0707)  Complications: No notable events documented.

## 2023-04-01 NOTE — Discharge Instructions (Addendum)
Resume diet as tolerated.  Rinse mouth with salt water 3 times daily.  You may have Tylenol again after 1pm today, if needed.   Post Anesthesia Home Care Instructions  Activity: Get plenty of rest for the remainder of the day. A responsible individual must stay with you for 24 hours following the procedure.  For the next 24 hours, DO NOT: -Drive a car -Advertising copywriter -Drink alcoholic beverages -Take any medication unless instructed by your physician -Make any legal decisions or sign important papers.  Meals: Start with liquid foods such as gelatin or soup. Progress to regular foods as tolerated. Avoid greasy, spicy, heavy foods. If nausea and/or vomiting occur, drink only clear liquids until the nausea and/or vomiting subsides. Call your physician if vomiting continues.  Special Instructions/Symptoms: Your throat may feel dry or sore from the anesthesia or the breathing tube placed in your throat during surgery. If this causes discomfort, gargle with warm salt water. The discomfort should disappear within 24 hours.  If you had a scopolamine patch placed behind your ear for the management of post- operative nausea and/or vomiting:  1. The medication in the patch is effective for 72 hours, after which it should be removed.  Wrap patch in a tissue and discard in the trash. Wash hands thoroughly with soap and water. 2. You may remove the patch earlier than 72 hours if you experience unpleasant side effects which may include dry mouth, dizziness or visual disturbances. 3. Avoid touching the patch. Wash your hands with soap and water after contact with the patch.

## 2023-04-01 NOTE — Interval H&P Note (Signed)
History and Physical Interval Note:  04/01/2023 8:01 AM  [name]  has presented today for surgery, with the diagnosis of Facial mass.  The various methods of treatment have been discussed with the patient and family. After consideration of risks, benefits and other options for treatment, the patient has consented to  Procedure(s): EXCISION FACIAL CYST (Right) as a surgical intervention.  The patient's history has been reviewed, patient examined, no change in status, stable for surgery.  I have reviewed the patient's chart and labs.  Questions were answered to the patient's satisfaction.     Serena Colonel

## 2023-04-01 NOTE — Op Note (Signed)
OPERATIVE REPORT  DATE OF SURGERY: 04/01/2023  PATIENT:  [name],  52 y.o. female  PRE-OPERATIVE DIAGNOSIS: Right perimandibular mass  POST-OPERATIVE DIAGNOSIS: Right perimandibular mass  PROCEDURE:  Procedure(s): EXCISION OF RIGHT perimandibular mass  SURGEON:  Susy Frizzle, MD  ASSISTANTS: None  ANESTHESIA:   General   EBL: 20 ml  DRAINS: None  LOCAL MEDICATIONS USED: 1% Xylocaine with epinephrine  SPECIMEN: Right perimandibular mass  COUNTS:  Correct  PROCEDURE DETAILS: The patient was taken to the operating room and placed on the operating table in the supine position. Following induction of general endotracheal anesthesia, patient was draped in a standard fashion.  Inspection of the right cheek area revealed the palpable mass.  Local anesthetic was infiltrated into the buccal mucosa.  Electrocautery was used to incise the mucosa approximately 2 cm in length.  Blunt dissection into deeper layers uncovered the palpable mass.  This seemed to be composed of fatty tissue, likely lipoma.  This was retracted with Allis forceps and carefully dissected from surrounding tissue.  It approached the subdermal tissue externally but did not connect to the dermis.  Skin was kept intact.  A single 4-0 silk tie was used on a bleeding vessel.  The remaining hemostasis was completed using electrocautery.  The wound was irrigated with saline after the specimen was retrieved.  The wound was closed in a single layer using running 3-0 chromic suture pulling up the.  Subcutaneous tissue to prevent dead space accumulation. Patient was awakened extubated and transferred to recovery in stable condition.    PATIENT DISPOSITION:  To PACU, stable

## 2023-04-01 NOTE — Anesthesia Postprocedure Evaluation (Signed)
Anesthesia Post Note  Patient: [name]  Procedure(s) Performed: EXCISION OF RIGHT PERI-MANDIBULAR MASS (Right: Mouth)     Patient location during evaluation: PACU Anesthesia Type: General Level of consciousness: awake and alert Pain management: pain level controlled Vital Signs Assessment: post-procedure vital signs reviewed and stable Respiratory status: spontaneous breathing, nonlabored ventilation and respiratory function stable Cardiovascular status: blood pressure returned to baseline Postop Assessment: no apparent nausea or vomiting Anesthetic complications: no   No notable events documented.  Last Vitals:  Vitals:   04/01/23 1000 04/01/23 1028  BP: 133/82 (!) 148/86  Pulse: 98 100  Resp: 19 16  Temp:  36.8 C  SpO2: 100% 94%    Last Pain:  Vitals:   04/01/23 1028  TempSrc: Temporal  PainSc: 4                  Shanda Howells

## 2023-04-02 ENCOUNTER — Encounter (HOSPITAL_BASED_OUTPATIENT_CLINIC_OR_DEPARTMENT_OTHER): Payer: Self-pay | Admitting: Otolaryngology

## 2023-04-02 LAB — SURGICAL PATHOLOGY
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
## 2023-04-04 ENCOUNTER — Encounter (HOSPITAL_COMMUNITY): Payer: Self-pay | Admitting: Psychiatry

## 2023-04-04 ENCOUNTER — Ambulatory Visit (INDEPENDENT_AMBULATORY_CARE_PROVIDER_SITE_OTHER): Payer: Medicaid Other | Admitting: Psychiatry

## 2023-04-04 DIAGNOSIS — F339 Major depressive disorder, recurrent, unspecified: Secondary | ICD-10-CM

## 2023-04-04 DIAGNOSIS — G479 Sleep disorder, unspecified: Secondary | ICD-10-CM | POA: Diagnosis not present

## 2023-04-04 DIAGNOSIS — F4310 Post-traumatic stress disorder, unspecified: Secondary | ICD-10-CM

## 2023-04-04 MED ORDER — PRAZOSIN HCL 1 MG PO CAPS: 1.0000 mg | ORAL_CAPSULE | Freq: Every day | ORAL | 1 refills | Status: DC

## 2023-04-04 MED ORDER — SERTRALINE HCL 100 MG PO TABS: ORAL_TABLET | ORAL | 11 refills | Status: DC

## 2023-04-04 MED ORDER — VENLAFAXINE HCL ER 37.5 MG PO CP24
ORAL_CAPSULE | ORAL | 1 refills | Status: DC
Start: 2023-04-04 — End: 2023-05-09

## 2023-04-04 NOTE — Patient Instructions (Signed)
Thank you for attending your appointment today.  -- SWITCH from Zoloft to Effexor  Week 1: start Effexor 37.5 mg daily; decrease Zoloft to 50 mg daily  Week 2: increase Effexor to 75 mg daily; STOP Zoloft -- START Prazosin 1 mg nightly -- Continue other medications as prescribed.  Please do not make any changes to medications without first discussing with your provider. If you are experiencing a psychiatric emergency, please call 911 or present to your nearest emergency department. Additional crisis, medication management, and therapy resources are included below.  [facility]  [address], [place], [place] [address] [phone] WALK-IN URGENT CARE 24/7 FOR ANYONE [address] [place], [place]  [phone] Fax: [phone] [email or web address] *Interpreters available *Accepts all insurance and uninsured for Urgent Care needs *Accepts Medicaid and uninsured for outpatient treatment (below)      ONLY FOR [facility]  Below:    Outpatient New Patient Assessment/Therapy Walk-ins:        Monday -Thursday 8am until slots are full.        Every Friday 1pm-4pm  (first come, first served)                   New Patient Psychiatry/Medication Management        Monday-Friday 8am-11am (first come, first served)               For all walk-ins we ask that you arrive by 7:15am, because patients will be seen in the order of arrival.

## 2023-04-11 ENCOUNTER — Ambulatory Visit
Admission: RE | Admit: 2023-04-11 | Discharge: 2023-04-11 | Disposition: A | Discharge status: Home / Self Care | Payer: Medicaid Other | Source: Ambulatory Visit | Attending: Neurological Surgery | Admitting: Neurological Surgery

## 2023-04-11 DIAGNOSIS — G8929 Other chronic pain: Secondary | ICD-10-CM

## 2023-04-11 MED ORDER — IOPAMIDOL (ISOVUE-300) INJECTION 61%
75.0000 mL | Freq: Once | INTRAVENOUS | Status: AC | PRN
  Administered 2023-04-11: 75 mL via INTRAVENOUS
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
## 2023-05-09 ENCOUNTER — Ambulatory Visit: Payer: Medicaid Other | Admitting: Internal Medicine

## 2023-05-09 VITALS — BP 120/84 | HR 83 | Resp 16 | Ht 64.0 in | Wt 174.0 lb

## 2023-05-09 DIAGNOSIS — R5383 Other fatigue: Secondary | ICD-10-CM | POA: Diagnosis not present

## 2023-05-09 DIAGNOSIS — R5381 Other malaise: Secondary | ICD-10-CM

## 2023-05-09 DIAGNOSIS — R42 Dizziness and giddiness: Secondary | ICD-10-CM

## 2023-05-09 MED ORDER — DULOXETINE HCL 30 MG PO CPEP: ORAL_CAPSULE | ORAL | 3 refills | Status: DC

## 2023-05-09 NOTE — Progress Notes (Signed)
Subjective:    Patient ID: [name], female   DOB: [birth year], 52 y.o.   MRN: [id number]   HPI   Fatigue:  states started 3 weeks ago, which would have been the first week of May.  Describes generalized weakness and exhaustion.  She was sleeping hours through the day and then would go to bed and sleep through the night.    She would also have fast palpitations--anywhere from 115 to 130 bpms.  Also describing being off balance and running into walls.  Ran into a wall and ended up with a bloody nose 3-4 days ago.   Ultimately, clarifies she stopped the Venlafaxine 37.5 mg after taking for 2 weeks--states she did not like the way it made her feel.   Admits she did not wean the Sertraline as recommended, just started taking Sertraline 2-3 times weekly and then went to only once weekly.  Thinks she took the last dose of Sertraline about 1.5 weeks ago.   CT of head was fine with expected postoperative changes at beginning of month. She noted her headaches to be getting better in April-seemed to be a slow recovery with headaches after brain surgery last September.  The improvement preceded her stopping anti-depressants.  No melena or hematochezia. No abdominal pain. She is no longer having periods.   Current Meds  Medication Sig   acetaminophen (TYLENOL) 500 MG tablet Take 1,000 mg by mouth every 6 (six) hours as needed for headache.   amLODipine (NORVASC) 5 MG tablet Take 1 tablet by mouth once daily   cholecalciferol (VITAMIN D3) 25 MCG (1000 UNIT) tablet Take 5,000 Units by mouth daily.   fluticasone (FLONASE) 50 MCG/ACT nasal spray Use 2 spray(s) in each nostril once daily   MELATONIN PO Take 1.5 mg by mouth at bedtime.   Multiple Vitamin (MULTIVITAMIN WITH MINERALS) TABS Take 1 tablet by mouth daily.   nitroGLYCERIN (NITROSTAT) 0.4 MG SL tablet Place 0.4 mg under the tongue every 5 (five) minutes as needed.   ondansetron (ZOFRAN-ODT) 4 MG disintegrating tablet Take 1 tablet (4 mg  total) by mouth every 8 (eight) hours as needed for nausea or vomiting.   sertraline (ZOLOFT) 100 MG tablet Decrease to 1/2 tablet for 1 week then STOP (Patient taking differently: 100 mg as needed. Decrease to 1/2 tablet for 1 week then STOP)   No Known Allergies   Review of Systems    Objective:   BP 120/84 (BP Location: Right Arm, Patient Position: Sitting, Cuff Size: Normal)   Pulse 83   Resp 16   Ht 5\' 4"  (1.626 m)   Wt 174 lb (78.9 kg)   LMP 09/25/2020 (Approximate)   BMI 29.87 kg/m   Physical Exam NAD HEENT:  PERRL, EOMI, Discs sharp, TMs pearly gray.  Throat without injection Neck:  Supple, No adenopathy, no thyromegaly Chest:  CTA CV:  RRR without murmur or rub.  No carotid bruits.  Carotid, radial and DP pulses normal and equal Abd:  S, NT + BS LE:  No edema Neuro:  A & O x 3, CN II-XII grossly intact, Motor 5/5, DTRs 2+/4, gait normal.  Finger to nose to finger, rapid alternating motions, heel to shin all normal.  Romberg negative.    Assessment & Plan   Fatigue, generalize weakness,dizziness:  historically coincides with basically sudden discontinuation of SSRI after long term use and not continuing with combination med, Venlafaxine.  She does not want to go back to Venlafaxine and Sertraline  more recently has not been effective.  Discussed another combination med, Duloxetine.  She is willing to try this.  She has a visit with her behavioral health provider tomorrow.   Duloxetine 30 mg daily for 3 days, then increase to 60 mg if tolerates. No concerning findings on exam today to suggest neurologic issue.  Check CBC, CMP, TSH as well. She will call in a progress report end of next week.  If she has a problem with duloxetine, to call.

## 2023-05-10 ENCOUNTER — Encounter (HOSPITAL_COMMUNITY): Payer: Self-pay | Admitting: Psychiatry

## 2023-05-10 ENCOUNTER — Telehealth (INDEPENDENT_AMBULATORY_CARE_PROVIDER_SITE_OTHER): Payer: Medicaid Other | Admitting: Psychiatry

## 2023-05-10 DIAGNOSIS — F339 Major depressive disorder, recurrent, unspecified: Secondary | ICD-10-CM | POA: Diagnosis not present

## 2023-05-10 DIAGNOSIS — F4310 Post-traumatic stress disorder, unspecified: Secondary | ICD-10-CM

## 2023-05-10 LAB — COMPREHENSIVE METABOLIC PANEL
ALT: 16 IU/L (ref 0–32)
AST: 17 IU/L (ref 0–40)
Albumin/Globulin Ratio: 1.9 (ref 1.2–2.2)
Albumin: 4.4 g/dL (ref 3.8–4.9)
Alkaline Phosphatase: 87 IU/L (ref 44–121)
BUN/Creatinine Ratio: 15 (ref 9–23)
BUN: 15 mg/dL (ref 6–24)
Bilirubin Total: 0.3 mg/dL (ref 0.0–1.2)
CO2: 23 mmol/L (ref 20–29)
Calcium: 9.8 mg/dL (ref 8.7–10.2)
Chloride: 107 mmol/L — ABNORMAL HIGH (ref 96–106)
Creatinine, Ser: 1.01 mg/dL — ABNORMAL HIGH (ref 0.57–1.00)
Globulin, Total: 2.3 g/dL (ref 1.5–4.5)
Glucose: 98 mg/dL (ref 70–99)
Potassium: 4.2 mmol/L (ref 3.5–5.2)
Sodium: 144 mmol/L (ref 134–144)
Total Protein: 6.7 g/dL (ref 6.0–8.5)
eGFR: 67 mL/min/{1.73_m2} (ref >59)

## 2023-05-10 LAB — CBC WITH DIFFERENTIAL/PLATELET
Basophils Absolute: 0.1 10*3/uL (ref 0.0–0.2)
Basos: 1 %
EOS (ABSOLUTE): 0.2 10*3/uL (ref 0.0–0.4)
Eos: 3 %
Hematocrit: 39.4 % (ref 34.0–46.6)
Hemoglobin: 13.5 g/dL (ref 11.1–15.9)
Immature Grans (Abs): 0 10*3/uL (ref 0.0–0.1)
Immature Granulocytes: 0 %
Lymphocytes Absolute: 2.1 10*3/uL (ref 0.7–3.1)
Lymphs: 30 %
MCH: 31.8 pg (ref 26.6–33.0)
MCHC: 34.3 g/dL (ref 31.5–35.7)
MCV: 93 fL (ref 79–97)
Monocytes Absolute: 0.5 10*3/uL (ref 0.1–0.9)
Monocytes: 7 %
Neutrophils Absolute: 4.2 10*3/uL (ref 1.4–7.0)
Neutrophils: 59 %
Platelets: 289 10*3/uL (ref 150–450)
RBC: 4.24 x10E6/uL (ref 3.77–5.28)
RDW: 13.8 % (ref 11.7–15.4)
WBC: 7.1 10*3/uL (ref 3.4–10.8)

## 2023-05-10 LAB — TSH: TSH: 1.21 u[IU]/mL (ref 0.450–4.500)

## 2023-05-10 NOTE — Patient Instructions (Signed)
Thank you for attending your appointment today.  -- We did not make any medication changes today. Please continue medications as prescribed.  Please do not make any changes to medications without first discussing with your provider. If you are experiencing a psychiatric emergency, please call 911 or present to your nearest emergency department. Additional crisis, medication management, and therapy resources are included below.  [facility]  [address], [place], [place] [address] [phone] WALK-IN URGENT CARE 24/7 FOR ANYONE [address], [place], [place]  [phone] Fax: [phone] [email or web address] *Interpreters available *Accepts all insurance and uninsured for Urgent Care needs *Accepts Medicaid and uninsured for outpatient treatment (below)      ONLY FOR [place] Residents  Below:    Outpatient New Patient Assessment/Therapy Walk-ins:        Monday -Thursday 8am until slots are full.        Every Friday 1pm-4pm  (first come, first served)                   New Patient Psychiatry/Medication Management        Monday-Friday 8am-11am (first come, first served)               For all walk-ins we ask that you arrive by 7:15am, because patients will be seen in the order of arrival.   

## 2023-05-13 ENCOUNTER — Ambulatory Visit (INDEPENDENT_AMBULATORY_CARE_PROVIDER_SITE_OTHER): Payer: Medicaid Other | Admitting: Mental Health

## 2023-05-13 ENCOUNTER — Encounter (HOSPITAL_COMMUNITY): Payer: Self-pay

## 2023-05-13 DIAGNOSIS — F332 Major depressive disorder, recurrent severe without psychotic features: Secondary | ICD-10-CM | POA: Diagnosis not present

## 2023-05-13 DIAGNOSIS — F4310 Post-traumatic stress disorder, unspecified: Secondary | ICD-10-CM

## 2023-05-13 NOTE — Progress Notes (Signed)
THERAPIST PROGRESS NOTE Virtual Visit via Video Note  I connected with [name] on 05/13/23 at 11:00 AM EDT by a video enabled telemedicine application and verified that I am speaking with the correct person using two identifiers.  Location: Patient: home address Provider: home office    I discussed the limitations of evaluation and management by telemedicine and the availability of in person appointments. The patient expressed understanding and agreed to proceed.   I discussed the assessment and treatment plan with the patient. The patient was provided an opportunity to ask questions and all were answered. The patient agreed with the plan and demonstrated an understanding of the instructions.   The patient was advised to call back or seek an in-person evaluation if the symptoms worsen or if the condition fails to improve as anticipated.  I provided 48 minutes of non-face-to-face time during this encounter.   Dorris Singh, [facility]   Session Time: 11:05am ( 48 minutes)  Participation Level: Active  Behavioral Response: CasualAlertDysphoric  Type of Therapy: Individual Therapy  Treatment Goals addressed: STG: Jermiya will increase management of depression and anxiety AEB ability to reframe maladaptive thinking patterns with engagement in community and/or enjoyable activity weekly within the next 90 days.   ProgressTowards Goals: Initial  Interventions: CBT and Supportive  Summary: [name] is a 52 y.o. female who presents with dx of MDD and PTSD. Notes chief complaint of depression, anxiety and feelings of grief. Presents alert and oriented; mood and affect low; dysphoric. Speech clear and coherent at normal rate and tone. Engaged and receptive to interventions. Notes hx of therapy in the past and feels as if it has been beneficial; denies to feel as if medications have been supportive and only serve to make her drowsy. Shares with therapist history of  daughter passing away in 2021 at the age of 43 as the result of a drunk driver. Shares to have also lost her husband in 2016 who was murdered. Shares can largely isolate her self with feelings of anxiety if "what is something bad happens" Noting difficulty in public spacing and presenting to the grocery store. Shares feelings of depression dating back since age of 89 and increased over the years with grief.  Notes has been able engage more in community presenting for walks x 3 days a weekly with daughter. Shares has been trying to garden more. 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 Explores with therapist treatment plan and working to engage in increased behaviors and learning to reframe thoughts. Agrees to work to engage in self-care daily and increased mindfulness of internal dialogue. Initial work on treatment goals. No change in sxs. No safety concerns.  Suicidal/Homicidal: Nowithout intent/plan  Therapist Response: Therapist engaged Radiation protection practitioner in [employer]. Completed check in and assessed for current level of functioning sxs management and level of stressors. Engaged in review of assessment completed 03/04/23. Supported in processing history of trauma and grief of passing of family members. Provided psycho-education of grief and working to grow your life around grief while still able to honor loved ones. Affirmed ability to engage in increased coping with behavioral activation with positive results in mood thus far. Engaged in education on presence of distorted thinking and faulty thinking causing increased anxiety. Educated on difference between could and likelihood and working to engage in balanced thinking. Explored social life, family life, employment and changes pt would like to see. Encouraged increase in daily self care and monitoring distorted  cognitions. Reviewed session provided follow up. Assessed for safety.   Plan: Return again in  x 4 weeks.  Diagnosis: Severe  episode of recurrent major depressive disorder, without psychotic features (HCC)  PTSD (post-traumatic stress disorder)  Collaboration of Care: Other None  Patient/Guardian was advised Release of Information must be obtained prior to any record release in order to collaborate their care with an outside provider. Patient/Guardian was advised if they have not already done so to contact the registration department to sign all necessary forms in order for [place] to release information regarding their care.   Consent: Patient/Guardian gives verbal consent for treatment and assignment of benefits for services provided during this visit. Patient/Guardian expressed understanding and agreed to proceed.   Stephan Minister [place], [facility] 05/13/2023

## 2023-05-16 ENCOUNTER — Telehealth: Payer: Self-pay

## 2023-05-16 ENCOUNTER — Ambulatory Visit: Payer: Medicaid Other | Admitting: Internal Medicine

## 2023-05-16 ENCOUNTER — Other Ambulatory Visit (INDEPENDENT_AMBULATORY_CARE_PROVIDER_SITE_OTHER): Payer: Medicaid Other

## 2023-05-16 DIAGNOSIS — R350 Frequency of micturition: Secondary | ICD-10-CM

## 2023-05-16 LAB — POCT URINALYSIS DIPSTICK
Bilirubin, UA: NEGATIVE
Blood, UA: NEGATIVE
Glucose, UA: NEGATIVE
Ketones, UA: NEGATIVE
Leukocytes, UA: NEGATIVE
Nitrite, UA: NEGATIVE
Protein, UA: NEGATIVE
Spec Grav, UA: 1.02 (ref 1.010–1.025)
Urobilinogen, UA: 0.2 E.U./dL
pH, UA: 6.5 (ref 5.0–8.0)

## 2023-05-16 NOTE — Telephone Encounter (Signed)
Patient called to report that since her last visit on 05/09/2023 she has had severe lower back pain. She can't walk much, bend over or squat down. Has noticed that she has increase urine frequency. Has noticed swelling in her hands and ankles. Has loss of appetite and nausea. Has taken tylenol which doesn't not help. Would like recommendations.

## 2023-05-17 LAB — COMPREHENSIVE METABOLIC PANEL
ALT: 16 IU/L (ref 0–32)
AST: 16 IU/L (ref 0–40)
Albumin/Globulin Ratio: 2.4 — ABNORMAL HIGH (ref 1.2–2.2)
Albumin: 4.7 g/dL (ref 3.8–4.9)
Alkaline Phosphatase: 84 IU/L (ref 44–121)
BUN/Creatinine Ratio: 14 (ref 9–23)
BUN: 14 mg/dL (ref 6–24)
Bilirubin Total: 0.4 mg/dL (ref 0.0–1.2)
CO2: 27 mmol/L (ref 20–29)
Calcium: 9.9 mg/dL (ref 8.7–10.2)
Chloride: 104 mmol/L (ref 96–106)
Creatinine, Ser: 1.01 mg/dL — ABNORMAL HIGH (ref 0.57–1.00)
Globulin, Total: 2 g/dL (ref 1.5–4.5)
Glucose: 92 mg/dL (ref 70–99)
Potassium: 4.2 mmol/L (ref 3.5–5.2)
Sodium: 143 mmol/L (ref 134–144)
Total Protein: 6.7 g/dL (ref 6.0–8.5)
eGFR: 67 mL/min/{1.73_m2} (ref >59)

## 2023-05-17 LAB — CBC WITH DIFFERENTIAL/PLATELET
Basophils Absolute: 0.1 10*3/uL (ref 0.0–0.2)
Basos: 1 %
EOS (ABSOLUTE): 0.1 10*3/uL (ref 0.0–0.4)
Eos: 2 %
Hematocrit: 40.3 % (ref 34.0–46.6)
Hemoglobin: 13.3 g/dL (ref 11.1–15.9)
Immature Grans (Abs): 0 10*3/uL (ref 0.0–0.1)
Immature Granulocytes: 0 %
Lymphocytes Absolute: 1.9 10*3/uL (ref 0.7–3.1)
Lymphs: 30 %
MCH: 30.5 pg (ref 26.6–33.0)
MCHC: 33 g/dL (ref 31.5–35.7)
MCV: 92 fL (ref 79–97)
Monocytes Absolute: 0.4 10*3/uL (ref 0.1–0.9)
Monocytes: 6 %
Neutrophils Absolute: 3.9 10*3/uL (ref 1.4–7.0)
Neutrophils: 61 %
Platelets: 298 10*3/uL (ref 150–450)
RBC: 4.36 x10E6/uL (ref 3.77–5.28)
RDW: 13.3 % (ref 11.7–15.4)
WBC: 6.4 10*3/uL (ref 3.4–10.8)

## 2023-05-20 ENCOUNTER — Ambulatory Visit (INDEPENDENT_AMBULATORY_CARE_PROVIDER_SITE_OTHER): Payer: Medicaid Other | Admitting: Internal Medicine

## 2023-05-20 ENCOUNTER — Encounter: Payer: Self-pay | Admitting: Internal Medicine

## 2023-05-20 VITALS — BP 124/86 | HR 88 | Resp 18 | Ht 64.0 in | Wt 176.0 lb

## 2023-05-20 DIAGNOSIS — M545 Low back pain, unspecified: Secondary | ICD-10-CM

## 2023-05-20 DIAGNOSIS — M5416 Radiculopathy, lumbar region: Secondary | ICD-10-CM | POA: Insufficient documentation

## 2023-05-20 MED ORDER — CYCLOBENZAPRINE HCL 10 MG PO TABS
ORAL_TABLET | ORAL | 0 refills | Status: DC
Start: 2023-05-20 — End: 2024-09-02

## 2023-05-20 MED ORDER — PREDNISONE 20 MG PO TABS
ORAL_TABLET | ORAL | 0 refills | Status: DC
Start: 2023-05-20 — End: 2023-08-19

## 2023-05-20 NOTE — Progress Notes (Signed)
    Subjective:    Patient ID: [name], female   DOB: March 18, 1971, 52 y.o.   MRN: [id number]   HPI  Right low back pain:  Started 1 week ago.  Noted after cooking dinner that evening and after stood up from eating.   Describes a stabbing, throbbing pain.  Hurts if she moves a certain way. Feels she is getting chills at times.   No radiation in to leg or buttock.   She cannot recall an acute injury before the pain started, nor does she recall doing anything in the days leading up to the pain. She did start walking with her daughter for 20 minutes 2-3 times weekly for 3 weeks prior to pain.  No discomfort with the walk.   She did come in 3 days ago for UA, CBC, CMP, all of which was normal.   She has not had a fever.    Depression:  she stopped taking the Cymbalta as she had stomach pain with it.  Her vertiginous symptoms have resolved.  She cannot remember when.  Current Meds  Medication Sig   acetaminophen (TYLENOL) 500 MG tablet Take 1,000 mg by mouth every 6 (six) hours as needed for headache.   amLODipine (NORVASC) 5 MG tablet Take 1 tablet by mouth once daily   cholecalciferol (VITAMIN D3) 25 MCG (1000 UNIT) tablet Take 5,000 Units by mouth daily.   fluticasone (FLONASE) 50 MCG/ACT nasal spray Use 2 spray(s) in each nostril once daily   MELATONIN PO Take 1.5 mg by mouth at bedtime.   Multiple Vitamin (MULTIVITAMIN WITH MINERALS) TABS Take 1 tablet by mouth daily.   nitroGLYCERIN (NITROSTAT) 0.4 MG SL tablet Place 0.4 mg under the tongue every 5 (five) minutes as needed.   No Known Allergies   Review of Systems    Objective:   BP 124/86 (BP Location: Left Arm, Patient Position: Sitting, Cuff Size: Normal)   Pulse 88   Resp 18   Ht 5\' 4"  (1.626 m)   Wt 176 lb (79.8 kg)   LMP 09/25/2020 (Approximate)   BMI 30.21 kg/m   Physical Exam Mild to moderate pain Lungs:  CTA CV:  RRR  Back:  NT over spinous processes of L/S spine.  NT over musculature of right low  back.   Neuro:  MOtor 5/5 and DTRs 2+/4 throughout.  Gait normal after taking first couple of steps.   Discomfort with external rotations and abduction of hip--feels the stretch there.   Assessment & Plan  Right low back pain vs pelvic girdle muscle pain:  Prednisone 20 mg daily for 5 days.  Cyclobenzaprine 5-10 mg every 8 hours as needed for pain.  Went over stretches.   To call if no improvement over next 48 hours or if new symptoms  Depression:  will defer to psych as to what to try with her as not tolerating meds.  Feel her symptoms with visit 10 days ago was from sudden discontinuation of SSRI after long time use.

## 2023-05-21 NOTE — Telephone Encounter (Signed)
Patient has been seen.

## 2023-05-28 ENCOUNTER — Telehealth: Payer: Self-pay

## 2023-05-28 NOTE — Telephone Encounter (Signed)
Patient called to report that her back pain has not gotten any better since her last visit. Patient also wanted to report that she now has pain on her right knee since Sunday. Patient has also noticed swelling on her knee.

## 2023-06-06 NOTE — Telephone Encounter (Signed)
Attempted to call patient, have been unable to reach her.

## 2023-06-20 ENCOUNTER — Ambulatory Visit (HOSPITAL_COMMUNITY): Payer: Medicaid Other | Admitting: Mental Health

## 2023-07-02 ENCOUNTER — Encounter (HOSPITAL_COMMUNITY): Payer: Self-pay

## 2023-07-12 ENCOUNTER — Telehealth (HOSPITAL_COMMUNITY): Payer: Medicaid Other | Admitting: Psychiatry

## 2023-07-24 ENCOUNTER — Ambulatory Visit (HOSPITAL_COMMUNITY): Payer: Medicaid Other | Admitting: Mental Health

## 2023-07-29 ENCOUNTER — Telehealth (HOSPITAL_COMMUNITY): Payer: Medicaid Other | Admitting: Psychiatry

## 2023-08-19 ENCOUNTER — Ambulatory Visit: Payer: Medicaid Other | Admitting: Internal Medicine

## 2023-08-19 ENCOUNTER — Encounter: Payer: Self-pay | Admitting: Internal Medicine

## 2023-08-19 ENCOUNTER — Ambulatory Visit
Admission: RE | Admit: 2023-08-19 | Discharge: 2023-08-19 | Disposition: A | Discharge status: Home / Self Care | Payer: Medicaid Other | Source: Ambulatory Visit | Attending: Internal Medicine | Admitting: Internal Medicine

## 2023-08-19 VITALS — BP 132/88 | HR 74 | Resp 16 | Ht 64.0 in | Wt 175.0 lb

## 2023-08-19 DIAGNOSIS — M5416 Radiculopathy, lumbar region: Secondary | ICD-10-CM

## 2023-08-19 NOTE — Patient Instructions (Signed)
[facility]:  [address]--can go whenever

## 2023-08-19 NOTE — Progress Notes (Signed)
    Subjective:    Patient ID: [name], female   DOB: Nov 19, 1971, 52 y.o.   MRN: [id number]   HPI   Right low back pain:  States the back pain has resolved, but now with pain in right buttock all the way down the back of thigh and lower leg to ankle.  Describes the pain as intermittent "shock" of pain.  Notes when awakens and gets out of bed.  When she bends forward in particular, she will get pain.  Has tingling at times in the same distribution.  She did perform stretches recommended.  She does not feel the prednisone and muscle relaxant really helped.  She was hoping with time, the discomfort would resolve.  She did not think to call if she did not improve with treatment in June. Also:  remembers now that her pain started in June after lifting a heavy push lawn mower into her car.    2.  Depression/PTSD:  She feels she is doing well.  She is not taking any medication currently.  Her visits are all via telehealth.  No suicidal thoughts.    Current Meds  Medication Sig   acetaminophen (TYLENOL) 500 MG tablet Take 1,000 mg by mouth every 6 (six) hours as needed for headache.   amLODipine (NORVASC) 5 MG tablet Take 1 tablet by mouth once daily   cholecalciferol (VITAMIN D3) 25 MCG (1000 UNIT) tablet Take 5,000 Units by mouth daily.   cyclobenzaprine (FLEXERIL) 10 MG tablet 1/2 to 1 tab every 8 hours as needed for back pain   fluticasone (FLONASE) 50 MCG/ACT nasal spray Use 2 spray(s) in each nostril once daily   MELATONIN PO Take 1.5 mg by mouth at bedtime.   Multiple Vitamin (MULTIVITAMIN WITH MINERALS) TABS Take 1 tablet by mouth daily.   nitroGLYCERIN (NITROSTAT) 0.4 MG SL tablet Place 0.4 mg under the tongue every 5 (five) minutes as needed.   No Known Allergies   Review of Systems    Objective:   BP 132/88 (BP Location: Left Arm, Patient Position: Sitting, Cuff Size: Normal)   Pulse 74   Resp 16   Ht 5\' 4"  (1.626 m)   Wt 175 lb (79.4 kg)   LMP 09/25/2020  (Approximate)   BMI 30.04 kg/m   Physical Exam NAD MS:  NT over spinous processes.  NT over paraspinous musculature. NS:  Motor 5/5 throughout and DTRs 2+/4 throughout LE bilaterally.  Normal gait.      Assessment & Plan  Radicular right lumbar pain, now with knowledge occurred after lifting mower into car.  Referral to [facility] where she has obtained care previously for knee injury.  Send for LS spine films.  Last checked in 2014 and normal.

## 2023-09-30 ENCOUNTER — Telehealth: Payer: Self-pay

## 2023-09-30 NOTE — Telephone Encounter (Signed)
Patient scheduled for OV on 10/01/2023 at 10:30 am.

## 2023-09-30 NOTE — Telephone Encounter (Signed)
Patient would like to be on wait list for an appointment .   Patient has been feeling lethargic, patient stated she is sleeping too much and feels unconscious when asleep . Patient has been having symptoms since Thursday, and has  been in bed since then.   Patient is concern as she had a Brain surgery a year ago.   We will call if there is a cancellation.

## 2023-10-01 ENCOUNTER — Encounter: Payer: Self-pay | Admitting: Internal Medicine

## 2023-10-01 ENCOUNTER — Ambulatory Visit: Payer: Medicaid Other | Admitting: Internal Medicine

## 2023-10-01 VITALS — BP 140/88 | HR 82 | Resp 14 | Ht 64.0 in | Wt 171.0 lb

## 2023-10-01 DIAGNOSIS — R5381 Other malaise: Secondary | ICD-10-CM | POA: Diagnosis not present

## 2023-10-01 DIAGNOSIS — R5383 Other fatigue: Secondary | ICD-10-CM | POA: Diagnosis not present

## 2023-10-01 DIAGNOSIS — I10 Essential (primary) hypertension: Secondary | ICD-10-CM

## 2023-10-01 NOTE — Progress Notes (Signed)
Subjective:    Patient ID: [name], female   DOB: 06/07/1971, 52 y.o.   MRN: [id number]   HPI  About 5 days ago, started with throbbing HA in temple area bilaterally.  Over next few days, just developed fatigue.  No bodyaches.  No fever, cough, sore throat, nausea, vomiting, or diarrhea.   No constipation. Appetite is decreased--just no energy to go fix herself something to eat.   She did stop taking her amlodipine the first week of October.   She is no longer having periods for some time. No melena or hematochezia.   No urinary complaints She cannot think of anyone around her who has been ill. She goes out to run errands on the 13th--shopping.   Feels she could sleep all day.   She does not feel she is depressed or anxious--she is following with her psychiatrist .   Just feels sickly.   Normal CBC, TSH and CMP save for mild elevation of Creatinine  Current Meds  Medication Sig   acetaminophen (TYLENOL) 500 MG tablet Take 1,000 mg by mouth every 6 (six) hours as needed for headache.   cholecalciferol (VITAMIN D3) 25 MCG (1000 UNIT) tablet Take 5,000 Units by mouth daily.   fluticasone (FLONASE) 50 MCG/ACT nasal spray Use 2 spray(s) in each nostril once daily   Multiple Vitamin (MULTIVITAMIN WITH MINERALS) TABS Take 1 tablet by mouth daily.   nitroGLYCERIN (NITROSTAT) 0.4 MG SL tablet Place 0.4 mg under the tongue every 5 (five) minutes as needed.   No Known Allergies   Review of Systems    Objective:   BP (!) 140/88 (BP Location: Left Arm, Patient Position: Sitting, Cuff Size: Normal)   Pulse 82   Resp 14   Ht 5\' 4"  (1.626 m)   Wt 171 lb (77.6 kg)   LMP 09/25/2020 (Approximate)   BMI 29.35 kg/m   Physical Exam NAD HEENT:  PERRL, EOMI, TMs pearly gray, unable to see posterior pharynx well.  Nasal mucosa is erythematous without significant clear drainage. NT over sinuses Neck:  Supple, No adenopathy, no thyromegaly Chest:  CTA CV:  RRR without murmur  or rub.  Radial and DP pulses normal and equal Abd:  S, NT, No HSM or mass, + BS LE:  No edema Skin:  no rash.   Assessment & Plan   Malaise and fatigue with headache:  not clear if a prodrome for virus or other infectious illness.  Concerned may be due to being without Amlodipine for 2 weeks.  Check CBC, CMP, TSH and to let us know if new symptoms.    2.  Hypertension:  not adequately controlled and concerned higher at times.  Restart Amlodipine and see if symptoms improve.  Encouraged her to call in future to discuss before stopping a chronic med.  We have had this discussion previously.   She denies depression.

## 2023-10-02 LAB — COMPREHENSIVE METABOLIC PANEL
ALT: 17 [IU]/L (ref 0–32)
AST: 20 [IU]/L (ref 0–40)
Albumin: 4.5 g/dL (ref 3.8–4.9)
Alkaline Phosphatase: 87 [IU]/L (ref 44–121)
BUN/Creatinine Ratio: 11 (ref 9–23)
BUN: 12 mg/dL (ref 6–24)
Bilirubin Total: 0.4 mg/dL (ref 0.0–1.2)
CO2: 25 mmol/L (ref 20–29)
Calcium: 10 mg/dL (ref 8.7–10.2)
Chloride: 106 mmol/L (ref 96–106)
Creatinine, Ser: 1.07 mg/dL — ABNORMAL HIGH (ref 0.57–1.00)
Globulin, Total: 2.1 g/dL (ref 1.5–4.5)
Glucose: 89 mg/dL (ref 70–99)
Potassium: 4.6 mmol/L (ref 3.5–5.2)
Sodium: 146 mmol/L — ABNORMAL HIGH (ref 134–144)
Total Protein: 6.6 g/dL (ref 6.0–8.5)
eGFR: 62 mL/min/{1.73_m2} (ref >59)

## 2023-10-02 LAB — CBC WITH DIFFERENTIAL/PLATELET
Basophils Absolute: 0.1 10*3/uL (ref 0.0–0.2)
Basos: 1 %
EOS (ABSOLUTE): 0.2 10*3/uL (ref 0.0–0.4)
Eos: 3 %
Hematocrit: 45.5 % (ref 34.0–46.6)
Hemoglobin: 14.4 g/dL (ref 11.1–15.9)
Immature Grans (Abs): 0 10*3/uL (ref 0.0–0.1)
Immature Granulocytes: 0 %
Lymphocytes Absolute: 2.6 10*3/uL (ref 0.7–3.1)
Lymphs: 31 %
MCH: 30.4 pg (ref 26.6–33.0)
MCHC: 31.6 g/dL (ref 31.5–35.7)
MCV: 96 fL (ref 79–97)
Monocytes Absolute: 0.5 10*3/uL (ref 0.1–0.9)
Monocytes: 6 %
Neutrophils Absolute: 5.2 10*3/uL (ref 1.4–7.0)
Neutrophils: 59 %
Platelets: 319 10*3/uL (ref 150–450)
RBC: 4.74 x10E6/uL (ref 3.77–5.28)
RDW: 13.6 % (ref 11.7–15.4)
WBC: 8.6 10*3/uL (ref 3.4–10.8)

## 2023-10-02 LAB — TSH: TSH: 3.34 u[IU]/mL (ref 0.450–4.500)

## 2023-10-07 ENCOUNTER — Telehealth: Payer: Self-pay

## 2023-10-07 NOTE — Telephone Encounter (Signed)
Patient called to reports that for the past few nights she has been waking up with night sweats, shivering and cold. Unsure of cause. Wanted to report to get recommendations to help with this problems.

## 2023-10-10 NOTE — Telephone Encounter (Signed)
Patient reports that since restarting amlodipine, patients symptom of fatigue and weakness have improved. She still feels fatigue and Malaise, but it is significantly less.   She also wanted to report that since yesterday 10/09/2023 she noticed right eye swelling. Patient denies, redness, itching, pain, discharge, and discomfort on her eye.

## 2023-10-11 NOTE — Telephone Encounter (Signed)
What part of eye is involved? If just lids, to apply cool compress and notify over the weekend if new concerning symptoms occur. Keep head elevated when awake and with sleep

## 2023-10-11 NOTE — Telephone Encounter (Signed)
Upper eye lid has the swelling. Patient has been notified of recommendations.

## 2023-10-15 ENCOUNTER — Ambulatory Visit: Payer: Medicaid Other | Admitting: Internal Medicine

## 2023-10-15 VITALS — BP 132/86 | HR 88 | Resp 16 | Ht 64.0 in | Wt 173.0 lb

## 2023-10-15 DIAGNOSIS — R5381 Other malaise: Secondary | ICD-10-CM | POA: Diagnosis not present

## 2023-10-15 DIAGNOSIS — R339 Retention of urine, unspecified: Secondary | ICD-10-CM | POA: Diagnosis not present

## 2023-10-15 DIAGNOSIS — H02841 Edema of right upper eyelid: Secondary | ICD-10-CM | POA: Diagnosis not present

## 2023-10-15 DIAGNOSIS — R5383 Other fatigue: Secondary | ICD-10-CM

## 2023-10-15 DIAGNOSIS — I10 Essential (primary) hypertension: Secondary | ICD-10-CM | POA: Diagnosis not present

## 2023-10-15 LAB — POCT URINALYSIS DIPSTICK
Bilirubin, UA: NEGATIVE
Blood, UA: NEGATIVE
Glucose, UA: NEGATIVE
Ketones, UA: NEGATIVE
Leukocytes, UA: NEGATIVE
Nitrite, UA: NEGATIVE
Protein, UA: NEGATIVE
Spec Grav, UA: 1.025 (ref 1.010–1.025)
Urobilinogen, UA: 0.2 U/dL
pH, UA: 6.5 (ref 5.0–8.0)

## 2023-10-15 NOTE — Progress Notes (Signed)
    Subjective:    Patient ID: [name], female   DOB: 07/28/1971, 52 y.o.   MRN: [id number]   HPI   Fatigue and weakness better since restarting amlodipine.  Still with some headaches--come and go, mainly on right side, sometimes on bilateral temple area.  Occurs daily.  Can be for hours.  Takes [employer] and goes away.   Less severe HAs since back on Amlodipine.    2.  Right upper eyelid with puffiness and skin fold lower on upper eyelid since 10/09/2023.  Comes and goes and was back this morning.  HOB elevated.  Sleeps on left side. Notes it only in the morning and can go away.  She cannot enumerate how many days this week she has had this.   No visual disturbance.   Cannot say the swelling is worse with the headaches.  3.  Hypertension:  improved control.       Current Meds  Medication Sig   acetaminophen (TYLENOL) 500 MG tablet Take 1,000 mg by mouth every 6 (six) hours as needed for headache.   amLODipine (NORVASC) 5 MG tablet Take 1 tablet by mouth once daily   cholecalciferol (VITAMIN D3) 25 MCG (1000 UNIT) tablet Take 5,000 Units by mouth daily.   cyclobenzaprine (FLEXERIL) 10 MG tablet 1/2 to 1 tab every 8 hours as needed for back pain   fluticasone (FLONASE) 50 MCG/ACT nasal spray Use 2 spray(s) in each nostril once daily   MELATONIN PO Take 1.5 mg by mouth at bedtime.   Multiple Vitamin (MULTIVITAMIN WITH MINERALS) TABS Take 1 tablet by mouth daily.   nitroGLYCERIN (NITROSTAT) 0.4 MG SL tablet Place 0.4 mg under the tongue every 5 (five) minutes as needed.   omeprazole (PRILOSEC) 40 MG capsule Take 1 capsule (40 mg total) by mouth daily.   No Known Allergies   Review of Systems    Objective:   BP 132/86 (BP Location: Left Arm, Patient Position: Sitting, Cuff Size: Normal)   Pulse 88   Resp 16   Ht 5\' 4"  (1.626 m)   Wt 173 lb (78.5 kg)   LMP 09/25/2020 (Approximate)   BMI 29.70 kg/m   Physical Exam NAD HEENT:  PERRL, EOMI, perhaps slight  edema of upper right eyelid.  No conjunctival or lid erythema.  Eyes appear symmetric. Neck:  Supple, No adenopathy Chest:  CTA CV:  RRR without murmur or rub.  Radial and DP pulses normal and equal LE:  No edema Neuro:  A & O x 3, CN II-XII grossly intact.  Motor 5/5 DTRs 2+/4 throughout  gait normal.     Assessment & Plan   Hypertension and headache:  improved with restart of amlodipine.  Encouraged her to seek out medical opinion before stopping meds in the future.   Has been a chronic issue with stopping meds on own.  2.  Fatigue and weakness:  also a chronic issue and improved since restarting amlodipine  3.  Right eye puffiness:  not clear what is cause and minimal findings today.  To call if continues to be an issue.    4.  Brings up incomplete urinary bladder emptying at end of visit:  UA

## 2023-11-14 ENCOUNTER — Telehealth: Payer: Self-pay

## 2023-11-14 NOTE — Telephone Encounter (Signed)
Offer patient an appointment for Tuesday, patient can not make it,  Patient would like an appointment by the middle or end of the week.

## 2023-11-14 NOTE — Telephone Encounter (Signed)
Patient would like an appointment for  Patient continues to have headaches and would like to talk to Dr. Delrae Alfred to see if she can change her Bp medication.    We will call patient if there is a cancellation.

## 2023-11-18 NOTE — Telephone Encounter (Signed)
Patient has been scheduled

## 2023-11-20 ENCOUNTER — Telehealth: Payer: Self-pay

## 2023-11-20 ENCOUNTER — Encounter: Payer: Self-pay | Admitting: Internal Medicine

## 2023-11-20 ENCOUNTER — Ambulatory Visit: Payer: Medicaid Other | Admitting: Internal Medicine

## 2023-11-20 VITALS — BP 138/88 | HR 90 | Resp 16 | Ht 64.0 in | Wt 174.0 lb

## 2023-11-20 DIAGNOSIS — F32A Depression, unspecified: Secondary | ICD-10-CM | POA: Diagnosis not present

## 2023-11-20 DIAGNOSIS — F410 Panic disorder [episodic paroxysmal anxiety] without agoraphobia: Secondary | ICD-10-CM

## 2023-11-20 DIAGNOSIS — R519 Headache, unspecified: Secondary | ICD-10-CM

## 2023-11-20 DIAGNOSIS — I10 Essential (primary) hypertension: Secondary | ICD-10-CM | POA: Diagnosis not present

## 2023-11-20 DIAGNOSIS — L659 Nonscarring hair loss, unspecified: Secondary | ICD-10-CM

## 2023-11-20 MED ORDER — DULOXETINE HCL 30 MG PO CPEP: ORAL_CAPSULE | ORAL | Status: DC

## 2023-11-20 NOTE — Telephone Encounter (Signed)
Patient needs appointment for work in for quick follow up Wed or Thurs.  Offered patient an appointment for Wednesday (11/27/23) but patient did not take it as patient has different appointments same day.   Patient would like the appointment on Thursday. 11/28/2023  We will call patient if there is a cancellation.

## 2023-11-20 NOTE — Patient Instructions (Signed)
Warm bath with neck stretches every night before bed. Call if headaches worsen.

## 2023-11-20 NOTE — Progress Notes (Signed)
Subjective:    Patient ID: [name], female   DOB: 05-21-1971, 52 y.o.   MRN: [id number]   HPI   Headaches:  Feels this is similar to headaches before she had meningioma removed.  States she did not feel her headaches were better when last seen after getting BP back down on Amlodipine, which I misunderstood.    2.  Hair loss:  feels due to restart of amlodipine.  Seems her hair is breaking more rather than coming out at root, but also part seems to be wider.  Seems to have a lot come out with showering or brushing hair.  3.  Depression:  has not been back to see Dr. Nash Dimmer since June.  She thought her last visit was just a couple of months ago.  She is finding herself tearful again.  Has likely been off anti-depressants for 6 months or more as she did not feel the meds helped and felt better off them.  Has also had a couple of panic attacks again as well.  Lasting 30-60 mins with the last one a week ago.   Feels she is sleeping okay Left upper and lower lids are twitching.   Was on Duloxetine most recently--only took for 2 weeks and never followed up with Dr. Nash Dimmer thereafter.  Had given a history earlier in fall she was continuing with counseling and felt it was helping and did not need medication.   History of stopping meds on own with subsequent difficulties.   Discussed high rate of recurrence for depression if more than one episode and to consider the possibility of chronic issue similar to hypertension and possible need for chronic long term treatment.      Current Meds  Medication Sig   acetaminophen (TYLENOL) 500 MG tablet Take 1,000 mg by mouth every 6 (six) hours as needed for headache.   amLODipine (NORVASC) 5 MG tablet Take 1 tablet by mouth once daily   cholecalciferol (VITAMIN D3) 25 MCG (1000 UNIT) tablet Take 5,000 Units by mouth daily.   cyclobenzaprine (FLEXERIL) 10 MG tablet 1/2 to 1 tab every 8 hours as needed for back pain   fluticasone (FLONASE) 50  MCG/ACT nasal spray Use 2 spray(s) in each nostril once daily   MELATONIN PO Take 1.5 mg by mouth at bedtime.   Multiple Vitamin (MULTIVITAMIN WITH MINERALS) TABS Take 1 tablet by mouth daily.   nitroGLYCERIN (NITROSTAT) 0.4 MG SL tablet Place 0.4 mg under the tongue every 5 (five) minutes as needed.   omeprazole (PRILOSEC) 40 MG capsule Take 1 capsule (40 mg total) by mouth daily.   No Known Allergies   Review of Systems    Objective:   BP 138/88 (BP Location: Right Arm, Patient Position: Sitting, Cuff Size: Normal)   Pulse 90   Resp 16   Ht 5\' 4"  (1.626 m)   Wt 174 lb (78.9 kg)   LMP 09/25/2020 (Approximate)   BMI 29.87 kg/m   Physical Exam NAD HEENT:  PERRL, EOMI, Discs sharp.  No eyelid twitching visible currently TMs pearly gray, throat without injection Hair parts perhaps wider.  No obvious broken hairs and no scalp abnormality noted.  Unable to gently pull out strands..   Neck:  supple, No adenopathy Chest:  CTA CV:  RRR without murmur or rub.  Radial and DP pulses normal and equal.   MS: Tender over L>>R trap and along musculature to nuchal ridge.   Neuro:  A & O x 2,  CN II-XII grossly intact.  Motor 5/5, DTRs 2+/4 throughout.  Gait normal  Assessment & Plan   Depression/panic disorder/anxiety:  She has been in recently for multiple different issues.  Has had this happen in past when depression and anxiety increasing.  She admits she also has more trouble during the holidays after loss of loved ones.  Restart duloxetine--2 bottles at home from her prescription in June.   appt in 1 week to make sure she is tolerating/not worsening.  She has an upcoming appt again with Dr Nash Dimmer and encouraged her to keep that.  2.  Headache:  will see if improves with treatment of depression and anxiety.  No concerning findings today.  She is concerned the small lesion in left frontal area on MR previously may be enlarging.  If HAs do not improve or she has new concerning symptoms, will  send for repeat MR of brain.  Component to tension HA as well.  Went over warm bath soaks before bed and neck stretches.    3.  Hypertension:  fair control.  Continue amlodipine.  4.  Hair loss:  recent normal TSH.  Suspect stress and perimenopausal time period for hair loss.  She would like to go to derm for evaluation.

## 2023-11-25 NOTE — Telephone Encounter (Signed)
Patient has been scheduled

## 2023-11-28 ENCOUNTER — Encounter: Payer: Self-pay | Admitting: Internal Medicine

## 2023-11-28 ENCOUNTER — Ambulatory Visit: Payer: Medicaid Other | Admitting: Internal Medicine

## 2023-11-28 VITALS — BP 136/86 | HR 100 | Resp 16 | Ht 64.0 in | Wt 172.0 lb

## 2023-11-28 DIAGNOSIS — R519 Headache, unspecified: Secondary | ICD-10-CM | POA: Insufficient documentation

## 2023-11-28 DIAGNOSIS — R479 Unspecified speech disturbances: Secondary | ICD-10-CM

## 2023-11-28 DIAGNOSIS — F419 Anxiety disorder, unspecified: Secondary | ICD-10-CM | POA: Insufficient documentation

## 2023-11-28 DIAGNOSIS — F32A Depression, unspecified: Secondary | ICD-10-CM | POA: Insufficient documentation

## 2023-11-28 NOTE — Progress Notes (Signed)
    Subjective:    Patient ID: [name], female   DOB: 04-26-1971, 52 y.o.   MRN: [id number]   HPI  Has been taking Duloxetine 30 mg for 8 days now.  No problems with suicidal ideation.  She does at times just feel she does not want to be here, but no active suicidal thoughts.  This is unchanged from before starting the Duloxetine.   Headaches unchanged.  She is also concerned as she feels words aren't coming out right.  Gives and example of saying "Driving down a [place]wny snoad"  instead of Driving down a snowy road"  She also has noted she is leaving lights on in the house.  This is worsening with time, but not since starting Duloxetine.   Has appt with psych next week.  Current Meds  Medication Sig   acetaminophen (TYLENOL) 500 MG tablet Take 1,000 mg by mouth every 6 (six) hours as needed for headache.   amLODipine (NORVASC) 5 MG tablet Take 1 tablet by mouth once daily   Aspirin-Salicylamide-Caffeine (BC HEADACHE PO) Take by mouth as needed.   cholecalciferol (VITAMIN D3) 25 MCG (1000 UNIT) tablet Take 5,000 Units by mouth daily.   cyclobenzaprine (FLEXERIL) 10 MG tablet 1/2 to 1 tab every 8 hours as needed for back pain   DULoxetine (CYMBALTA) 30 MG capsule 1 cap by mouth once daily for 3 to 7 days then increase to 2 caps once daily   fluticasone (FLONASE) 50 MCG/ACT nasal spray Use 2 spray(s) in each nostril once daily   MELATONIN PO Take 1.5 mg by mouth at bedtime.   Multiple Vitamin (MULTIVITAMIN WITH MINERALS) TABS Take 1 tablet by mouth daily.   nitroGLYCERIN (NITROSTAT) 0.4 MG SL tablet Place 0.4 mg under the tongue every 5 (five) minutes as needed.   No Known Allergies   Review of Systems    Objective:   BP 136/86 (BP Location: Right Arm, Patient Position: Sitting, Cuff Size: Normal)   Pulse 100   Resp 16   Ht 5\' 4"  (1.626 m)   Wt 172 lb (78 kg)   LMP 09/25/2020 (Approximate)   BMI 29.52 kg/m   Physical Exam NAD HEENT: PERRL, EOMI, discs sharp Neck:   Supple,  Chest:  CTA CV:  RRR without murmur or rub, radial pulses normal and equal Neuro:  A & O x 3, CN II-XII grossly intact.  Motor 5/5 DTRs 2+/4 throughout.  Speech very clear and concise.   Assessment & Plan   Headache with history of remaining small left frontal meningioma:  repeat MR of brain.  2.  Depression/anxiety:  Encouraged her to increase dose of Duloxetine to 60 mg daily.  If does not tolerate, to decrease back to 30 mg for another week and then try again.  Has appt with Dr. Nash Dimmer on 12/31

## 2023-12-10 ENCOUNTER — Ambulatory Visit (INDEPENDENT_AMBULATORY_CARE_PROVIDER_SITE_OTHER): Payer: Medicaid Other | Admitting: Mental Health

## 2023-12-10 ENCOUNTER — Encounter (HOSPITAL_COMMUNITY): Payer: Self-pay

## 2023-12-10 DIAGNOSIS — F332 Major depressive disorder, recurrent severe without psychotic features: Secondary | ICD-10-CM

## 2023-12-10 DIAGNOSIS — F4310 Post-traumatic stress disorder, unspecified: Secondary | ICD-10-CM

## 2023-12-10 NOTE — Progress Notes (Signed)
 THERAPIST PROGRESS NOTE  Session Time: 2:03 pm( 54 minutes)  Participation Level: Active  Behavioral Response: CasualAlertDepressed and Dysphoric  Type of Therapy: Individual Therapy  Treatment Goals addressed:  STG: Scottie will increase management of depression and anxiety AEB ability to reframe maladaptive thinking patterns with engagement in community and/or enjoyable activity weekly within the next 90 days.   ProgressTowards Goals: Progressing  Interventions: CBT and Supportive  Summary: [name] is a 52y.o. female who presents with dx of MDD and PTSD. Notes chief complaint of depression, anxiety and feelings of grief. Presents alert and oriented; mood and affect low; dysphoric. Speech clear and coherent at normal rate and tone. Engaged and receptive to interventions. Shares feelings of ongoing depression and grief. Reports to have ceased therapy due to suffering back injury in the summer and was experiencing high degree of pain, shares for pain to be ongoing and currently receives shots from her doctor. Shares to always feel sad with difficulty getting out of the house. Shares was able to spend time with friend's family for the holidays in which she enjoyed but reports to feel guilty for engaging in things that she enjoys. Notes maladaptive thoughts of husband and daughter's death to be her fault and shares interaction with them prior to their deaths. Able to explore with therapist feelings of guilt and identification of factors that lead to her decision making with interactions with them. Notes would like to not feel sad all the time and explores ability to experience additional feelings outside of sadness. Notes family hx of depression. Shares invitation of going to church for new years eve and initially hesitant, able to identify benefits of going as well as concerns for going. Reviews and agrees to ongoing goals from previous interaction with therapy with desire to engage in  community. Explores thoughts of possibly engaging n volunteer work  I like helping people and shares hx of feeling better when she presented to church noting to have sang in choir and to have head lead songs. Agrees to present to church and explore ability to engage in sitting outside and explore ability to experience pleasant emotions. Denies safety concerns. Initial goal Suicidal/Homicidal: Nowithout intent/plan  Therapist Response:  Therapist engaged [facility] in therapy session. Completed check in and assessed for current level of functioning sxs management and level of stressors. Provided safe space to share thoughts and feelings and thoughts of husband and daughter whom have passed away. Supported in processing maladaptive thoughts and encouraged working to engage balanced thoughts. Processed feeling of guilt and blame and working to build life around feelings of grief. Supported in processing feelings of grief working to challenge distorted thoughts. Explored feelings of joy and happiness and desire to engage in behavioral change to support in reduction of depression feelings. Normalized and validated feelings while providing support and encouragement. Reviewed treatment plan and provided follow up. Encouraged presenting to community x 2 or 3 by next appointment. No safety concerns reported.   Plan: Return again in  xn6 weeks.  Diagnosis: Severe episode of recurrent major depressive disorder, without psychotic features (HCC)  PTSD (post-traumatic stress disorder)  Collaboration of Care: Other None  Patient/Guardian was advised Release of Information must be obtained prior to any record release in order to collaborate their care with an outside provider. Patient/Guardian was advised if they have not already done so to contact the registration department to sign all necessary forms in order for us  to release information regarding their care.   Consent:  Patient/Guardian gives verbal consent for  treatment and assignment of benefits for services provided during this visit. Patient/Guardian expressed understanding and agreed to proceed.   Ty Asal Sulphur, [facility] 12/10/2023

## 2023-12-27 ENCOUNTER — Encounter: Payer: Self-pay | Admitting: Internal Medicine

## 2024-01-02 ENCOUNTER — Ambulatory Visit: Payer: Medicaid Other | Admitting: Internal Medicine

## 2024-01-02 ENCOUNTER — Encounter: Payer: Self-pay | Admitting: Internal Medicine

## 2024-01-02 VITALS — BP 120/80 | HR 84 | Resp 16 | Ht 64.0 in | Wt 172.0 lb

## 2024-01-02 DIAGNOSIS — R519 Headache, unspecified: Secondary | ICD-10-CM | POA: Diagnosis not present

## 2024-01-02 DIAGNOSIS — F419 Anxiety disorder, unspecified: Secondary | ICD-10-CM | POA: Diagnosis not present

## 2024-01-02 DIAGNOSIS — F32A Depression, unspecified: Secondary | ICD-10-CM | POA: Diagnosis not present

## 2024-01-02 DIAGNOSIS — F458 Other somatoform disorders: Secondary | ICD-10-CM

## 2024-01-02 DIAGNOSIS — F410 Panic disorder [episodic paroxysmal anxiety] without agoraphobia: Secondary | ICD-10-CM

## 2024-01-02 MED ORDER — DULOXETINE HCL 60 MG PO CPEP: 60.0000 mg | ORAL_CAPSULE | Freq: Every day | ORAL | 11 refills | Status: DC

## 2024-01-02 NOTE — Progress Notes (Signed)
    Subjective:    Patient ID: [name], female   DOB: 11/05/1971, 53 y.o.   MRN: [id number]   HPI   Headaches:  Now with episode of occipital headache about 1 week ago when she was getting groceries out of trunk of car.  Had to bend forward and hold hands against her occiput.  Lasted just 3-5 minutes.   Second frontal HA, throbbing in nature when getting something to drink.  Also, only 5 minutes or less.  Feels really tired subsequently.   No focal numbness, tingling, or weakness.  She does chronically grind her teeth at night and during day. Also, feeling very nauseated recently with decreased appetite. Cola 2-3 times per week Chocolate every day, but not a large amount. Taking BC HA powders twice weekly.      Her MR of brain has still not been done--scheduled for 01/11/24.    2.  Depression:  states she is feeling better since starting Cymbalta.  She has been taking 60 mg daily since about 1 month ago.  Not crying as much.  Maybe a bit more energy.  Does not like going out much.   Does not look forward to her day. I am unable to see her counseling notes.  Patient willing to sign release so I may speak with her counselor.   Current Meds  Medication Sig   acetaminophen (TYLENOL) 500 MG tablet Take 1,000 mg by mouth every 6 (six) hours as needed for headache.   amLODipine (NORVASC) 5 MG tablet Take 1 tablet by mouth once daily   Aspirin-Salicylamide-Caffeine (BC HEADACHE PO) Take by mouth as needed.   cholecalciferol (VITAMIN D3) 25 MCG (1000 UNIT) tablet Take 5,000 Units by mouth daily.   cyclobenzaprine (FLEXERIL) 10 MG tablet 1/2 to 1 tab every 8 hours as needed for back pain   DULoxetine (CYMBALTA) 30 MG capsule 1 cap by mouth once daily for 3 to 7 days then increase to 2 caps once daily   fluticasone (FLONASE) 50 MCG/ACT nasal spray Use 2 spray(s) in each nostril once daily   MELATONIN PO Take 1.5 mg by mouth at bedtime.   Multiple Vitamin (MULTIVITAMIN WITH MINERALS) TABS  Take 1 tablet by mouth daily.   nitroGLYCERIN (NITROSTAT) 0.4 MG SL tablet Place 0.4 mg under the tongue every 5 (five) minutes as needed.   No Known Allergies   Review of Systems    Objective:   BP 120/80 (BP Location: Right Arm, Patient Position: Sitting, Cuff Size: Normal)   Pulse 84   Resp 16   Ht 5\' 4"  (1.626 m)   Wt 172 lb (78 kg)   LMP 09/25/2020 (Approximate)   BMI 29.52 kg/m   Physical Exam NAD HEENT:  PERRL, EOMI, TMs pearly gray, throat without injection Neck:  Supple, with tender traps bilaterally.  Also tender over musculature medial to scapulae bilaterally Chest:  CTA CV:  RRR without murmur or rub. Neuro normal and unchanged.   Assessment & Plan   Headaches:  Tension related.  Referral to PT.. Waiting for repeat MR of head, but expect will be unchanged.  2.  Bruxism:  may be adding to headache, neck pain:  bit block from [employer].  Discussed how to fit and use.    3.  Depression/anxiety/panic disorder:  improved with Cymbalta.  Hopefully her headaches will improve with time as well.

## 2024-01-02 NOTE — Patient Instructions (Addendum)
Sleep Right bite block for sleep

## 2024-01-11 ENCOUNTER — Ambulatory Visit
Admission: RE | Admit: 2024-01-11 | Discharge: 2024-01-11 | Disposition: A | Discharge status: Home / Self Care | Payer: Medicaid Other | Source: Ambulatory Visit | Attending: Internal Medicine | Admitting: Internal Medicine

## 2024-01-11 DIAGNOSIS — R519 Headache, unspecified: Secondary | ICD-10-CM

## 2024-01-11 DIAGNOSIS — R479 Unspecified speech disturbances: Secondary | ICD-10-CM

## 2024-01-11 MED ORDER — GADOPICLENOL 0.5 MMOL/ML IV SOLN
7.5000 mL | Freq: Once | INTRAVENOUS | Status: AC | PRN
  Administered 2024-01-11: 7.5 mL via INTRAVENOUS

## 2024-01-20 ENCOUNTER — Emergency Department (HOSPITAL_COMMUNITY): Payer: Medicaid Other

## 2024-01-20 ENCOUNTER — Encounter (HOSPITAL_COMMUNITY): Payer: Self-pay

## 2024-01-20 ENCOUNTER — Other Ambulatory Visit: Payer: Self-pay

## 2024-01-20 ENCOUNTER — Emergency Department (HOSPITAL_COMMUNITY)
Admission: EM | Admit: 2024-01-20 | Discharge: 2024-01-20 | Disposition: A | Discharge status: Home / Self Care | Payer: Medicaid Other | Attending: Emergency Medicine | Admitting: Emergency Medicine

## 2024-01-20 DIAGNOSIS — D329 Benign neoplasm of meninges, unspecified: Secondary | ICD-10-CM | POA: Insufficient documentation

## 2024-01-20 DIAGNOSIS — R42 Dizziness and giddiness: Secondary | ICD-10-CM

## 2024-01-20 DIAGNOSIS — Z79899 Other long term (current) drug therapy: Secondary | ICD-10-CM | POA: Insufficient documentation

## 2024-01-20 DIAGNOSIS — R531 Weakness: Secondary | ICD-10-CM

## 2024-01-20 LAB — BASIC METABOLIC PANEL
Anion gap: 9 (ref 5–15)
BUN: 16 mg/dL (ref 6–20)
CO2: 24 mmol/L (ref 22–32)
Calcium: 9.5 mg/dL (ref 8.9–10.3)
Chloride: 109 mmol/L (ref 98–111)
Creatinine, Ser: 0.79 mg/dL (ref 0.44–1.00)
GFR, Estimated: >60 mL/min (ref >60)
Glucose, Bld: 119 mg/dL — ABNORMAL HIGH (ref 70–99)
Potassium: 3.9 mmol/L (ref 3.5–5.1)
Sodium: 142 mmol/L (ref 135–145)

## 2024-01-20 LAB — RESP PANEL BY RT-PCR (RSV, FLU A&B, COVID)  RVPGX2
Influenza A by PCR: NEGATIVE
Influenza B by PCR: NEGATIVE
Resp Syncytial Virus by PCR: NEGATIVE
SARS Coronavirus 2 by RT PCR: NEGATIVE

## 2024-01-20 LAB — CBC WITH DIFFERENTIAL/PLATELET
Abs Immature Granulocytes: 0.02 10*3/uL (ref 0.00–0.07)
Basophils Absolute: 0.1 10*3/uL (ref 0.0–0.1)
Basophils Relative: 1 %
Eosinophils Absolute: 0.1 10*3/uL (ref 0.0–0.5)
Eosinophils Relative: 2 %
HCT: 41.1 % (ref 36.0–46.0)
Hemoglobin: 13.4 g/dL (ref 12.0–15.0)
Immature Granulocytes: 0 %
Lymphocytes Relative: 24 %
Lymphs Abs: 1.9 10*3/uL (ref 0.7–4.0)
MCH: 30.9 pg (ref 26.0–34.0)
MCHC: 32.6 g/dL (ref 30.0–36.0)
MCV: 94.7 fL (ref 80.0–100.0)
Monocytes Absolute: 0.5 10*3/uL (ref 0.1–1.0)
Monocytes Relative: 7 %
Neutro Abs: 5.2 10*3/uL (ref 1.7–7.7)
Neutrophils Relative %: 66 %
Platelets: 256 10*3/uL (ref 150–400)
RBC: 4.34 MIL/uL (ref 3.87–5.11)
RDW: 14 % (ref 11.5–15.5)
WBC: 7.8 10*3/uL (ref 4.0–10.5)
nRBC: 0 % (ref 0.0–0.2)

## 2024-01-20 NOTE — ED Provider Triage Note (Signed)
Emergency Medicine Provider Triage Evaluation Note  [name] , a 53 y.o. female  was evaluated in triage.  Pt complains of nausea, chills, fatigue starting today.  States that she was at PT when symptoms started.  Has ambulated today without difficulty.  Denies fever, headache, cough, congestion, sore throat, chest pain, shortness of breath, abdominal pain, vomiting, diarrhea, dysuria, lower extremity swelling  Review of Systems  Positive: N/a Negative: N/a  Physical Exam  BP (!) 141/76   Pulse 92   Temp 98 F (36.7 C) (Oral)   Resp 16   Ht 5\' 4"  (1.626 m)   Wt 78 kg   LMP 09/25/2020 (Approximate)   SpO2 100%   BMI 29.52 kg/m  Gen:   Awake, no distress   Resp:  Normal effort  MSK:   Moves extremities without difficulty  Other:    Medical Decision Making  Medically screening exam initiated at 5:58 PM.  Appropriate orders placed.  [name] was informed that the remainder of the evaluation will be completed by another provider, this initial triage assessment does not replace that evaluation, and the importance of remaining in the ED until their evaluation is complete.     Lunette Stands, [place] 01/20/24 1800

## 2024-01-20 NOTE — ED Provider Notes (Signed)
[place] EMERGENCY DEPARTMENT AT [facility] Provider Note   CSN: [id number] Arrival date & time: 01/20/24  1649     History  No chief complaint on file.   [name] is a 53 y.o. female presents with shortness of breath, dizziness and leg weakness with difficulty ambulating that occurred earlier today at 5 PM.  In triage she complained of nausea and chills.   States she had a craniotomy in 2023 for a meningioma.  She is concerned that she is having complications now.  She has been followed up with her primary care doctor for chronic headaches and dizziness over the past 2 months.  She had a repeat MRI completed on 2/1.  Results are still pending.  HPI     Home Medications Prior to Admission medications   Medication Sig Start Date End Date Taking? Authorizing Provider  acetaminophen (TYLENOL) 500 MG tablet Take 1,000 mg by mouth every 6 (six) hours as needed for headache.    [provider]  amLODipine (NORVASC) 5 MG tablet Take 1 tablet by mouth once daily 02/22/23   Julieanne Manson, MD  Aspirin-Salicylamide-Caffeine [facility] HEADACHE PO) Take by mouth as needed.    [provider]  cholecalciferol (VITAMIN D3) 25 MCG (1000 UNIT) tablet Take 5,000 Units by mouth daily.    [provider]  cyclobenzaprine (FLEXERIL) 10 MG tablet 1/2 to 1 tab every 8 hours as needed for back pain 05/20/23   Julieanne Manson, MD  DULoxetine (CYMBALTA) 60 MG capsule Take 1 capsule (60 mg total) by mouth daily. 01/02/24   Julieanne Manson, MD  fluticasone Aleda Grana) 50 MCG/ACT nasal spray Use 2 spray(s) in each nostril once daily 03/05/23   Julieanne Manson, MD  MELATONIN PO Take 1.5 mg by mouth at bedtime.    [provider]  Multiple Vitamin (MULTIVITAMIN WITH MINERALS) TABS Take 1 tablet by mouth daily.    [provider]  nitroGLYCERIN (NITROSTAT) 0.4 MG SL tablet Place 0.4 mg under the tongue every 5 (five) minutes as needed.     [provider]  omeprazole (PRILOSEC) 40 MG capsule Take 1 capsule (40 mg total) by mouth daily. Patient not taking: Reported on 11/28/2023 11/20/21   Unk Lightning, PA      Allergies    Patient has no known allergies.    Review of Systems   Review of Systems  Neurological:  Positive for dizziness.    Physical Exam Updated Vital Signs BP 133/82   Pulse (!) 106   Temp 98 F (36.7 C) (Oral)   Resp 17   Ht 5\' 4"  (1.626 m)   Wt 78 kg   LMP 09/25/2020 (Approximate)   SpO2 100%   BMI 29.52 kg/m  Physical Exam Vitals and nursing note reviewed.  Constitutional:      General: She is not in acute distress.    Appearance: She is well-developed.  HENT:     Head: Normocephalic and atraumatic.  Eyes:     Conjunctiva/sclera: Conjunctivae normal.  Cardiovascular:     Rate and Rhythm: Normal rate and regular rhythm.     Heart sounds: No murmur heard. Pulmonary:     Effort: Pulmonary effort is normal. No respiratory distress.     Breath sounds: Normal breath sounds.  Abdominal:     Palpations: Abdomen is soft.     Tenderness: There is no abdominal tenderness.  Musculoskeletal:        General: No swelling.     Cervical  back: Neck supple.  Skin:    General: Skin is warm and dry.     Capillary Refill: Capillary refill takes less than 2 seconds.  Neurological:     General: No focal deficit present.     Mental Status: She is alert and oriented to person, place, and time.     Cranial Nerves: No cranial nerve deficit.     Sensory: No sensory deficit.     Motor: No weakness.     Coordination: Coordination normal.     Comments: There is no facial droop or abnormal phonation.  Van negative.  She is able to ambulate with some difficulty  Psychiatric:        Mood and Affect: Mood normal.     ED Results / Procedures / Treatments   Labs (all labs ordered are listed, but only abnormal results are displayed) Labs Reviewed  BASIC METABOLIC PANEL - Abnormal; Notable  for the following components:      Result Value   Glucose, Bld 119 (*)    All other components within normal limits  RESP PANEL BY RT-PCR (RSV, FLU A&B, COVID)  RVPGX2  CBC WITH DIFFERENTIAL/PLATELET    EKG None  Radiology CT Head Wo Contrast Result Date: 01/20/2024 CLINICAL DATA:  Meningioma EXAM: CT HEAD WITHOUT CONTRAST TECHNIQUE: Contiguous axial images were obtained from the base of the skull through the vertex without intravenous contrast. RADIATION DOSE REDUCTION: This exam was performed according to the departmental dose-optimization program which includes automated exposure control, adjustment of the mA and/or kV according to patient size and/or use of iterative reconstruction technique. COMPARISON:  MRI head January 11, 2024. FINDINGS: Brain: No evidence of acute large vascular territory infarction, acute hemorrhage, hydrocephalus, or extra-axial collection. No abnormal mass effect or midline shift. Small meningioma better seen on recent MRI. Vascular: No hyperdense vessel. Skull: No acute fracture.  Right frontal craniotomy. Sinuses/Orbits: Clear sinuses.  No acute orbital findings. Other: No mastoid effusions. IMPRESSION: 1. No evidence of acute intracranial abnormality. 2. Small meningioma better seen on recent MRI. Electronically Signed   By: Feliberto Harts M.D.   On: 01/20/2024 22:07    Procedures Procedures    Medications Ordered in ED Medications - No data to display  ED Course/ Medical Decision Making/ A&P   {  This patient presents to the ED with chief complaint(s) of leg weakness, headache and dizziness.  The complaint involves an extensive differential diagnosis and also carries with it a high risk of complications and morbidity.   pertinent past medical history as listed in HPI  The differential diagnosis includes  CVA, TIA, subarachnoid hemorrhage, mass effect, cardiac etiology, pneumonia, URI, COPD, asthma, CHF  Additional history obtained:  Records reviewed  previous admission documents, Care Everywhere/External Records, and Primary Care Documents  Initial Assessment:   Patient presents mildly hypertensive with initial complaints in triage of nausea and chills, now reporting headache, dizziness, leg weakness with difficulty ambulating that started around 5 PM this afternoon.  She does have a history of meningioma treated operatively with craniotomy in 2023.  Reports that she has had headache and dizziness rather persistently over the past 2 months, however with the leg weakness and difficulty ambulating is new for her.  On exam she has 5 out of 5 lower extremity strength, sensation is intact.  The remainder of her neuroexam is benign.  Although she does describe difficulty ambulating and feeling like her legs are weak and shaky.  Independent ECG interpretation:  Sinus tachycardia  Independent labs interpretation:  The following labs were independently interpreted:  BMP without significant abnormality, CBC unremarkable, respiratory panel negative  Independent visualization and interpretation of imaging: I independently visualized the following imaging with scope of interpretation limited to determining acute life threatening conditions related to emergency care: CT head which revealed no acute intracranial abnormality, meningioma noted  MRI from 2/1 showed unchanged meningioma  Discussed findings with patient and discharge plan with patient.  She is agreeable.  She is able to ambulate now without difficulty.  Treatment and Reassessment: No medications administered during visit  Consultations obtained:   none  Disposition:   Patient will be discharged home encouraged to follow-up with primary care doctor earlier this week. The patient has been appropriately medically screened and/or stabilized in the ED. I have low suspicion for any other emergent medical condition which would require further screening, evaluation or treatment in the ED or  require inpatient management. At time of discharge the patient is hemodynamically stable and in no acute distress. I have discussed work-up results and diagnosis with patient and answered all questions. Patient is agreeable with discharge plan. We discussed strict return precautions for returning to the emergency department and they verbalized understanding.     Social Determinants of Health:   none  This note was dictated with voice recognition software.  Despite best efforts at proofreading, errors may have occurred which can change the documentation meaning.    Final Clinical Impression(s) / ED Diagnoses Final diagnoses:  Weakness  Dizziness  Meningioma [facility])    Rx / DC Orders ED Discharge Orders     None         Fabienne Bruns 01/20/24 2336    Bethann Berkshire, MD 01/22/24 [phone]

## 2024-01-20 NOTE — ED Triage Notes (Signed)
 BIB EMS from her PT appt for nausea and chills that started today.

## 2024-01-20 NOTE — Discharge Instructions (Addendum)
 You were evaluated in the emergency room for difficulty walking, dizziness and headache.  Your lab work did not show any significant abnormality.  Your imaging was consistent with a meningioma.  Please follow-up with your primary care doctor within the next week to review these findings.

## 2024-01-22 ENCOUNTER — Telehealth: Payer: Self-pay

## 2024-01-22 NOTE — Telephone Encounter (Signed)
Patient needs an after ED appointment 01/22/2024.

## 2024-01-23 ENCOUNTER — Ambulatory Visit: Payer: Medicaid Other | Admitting: Internal Medicine

## 2024-01-23 ENCOUNTER — Encounter: Payer: Self-pay | Admitting: Internal Medicine

## 2024-01-23 VITALS — BP 138/90 | HR 84 | Resp 16 | Ht 64.0 in | Wt 168.5 lb

## 2024-01-23 DIAGNOSIS — R519 Headache, unspecified: Secondary | ICD-10-CM

## 2024-01-23 DIAGNOSIS — F458 Other somatoform disorders: Secondary | ICD-10-CM | POA: Diagnosis not present

## 2024-01-23 DIAGNOSIS — I10 Essential (primary) hypertension: Secondary | ICD-10-CM

## 2024-01-23 DIAGNOSIS — F32A Depression, unspecified: Secondary | ICD-10-CM

## 2024-01-23 NOTE — Telephone Encounter (Signed)
Patient has been scheduled

## 2024-01-23 NOTE — Progress Notes (Signed)
Subjective:    Patient ID: [name], female   DOB: 03-Jun-1971, 53 y.o.   MRN: [id number]   HPI  Generalized weakness, and dizziness as if she would pass out, legs felt really weak after receiving PT for low back pain on 01/20/2024.  She states her BP went up as did HR and so she was transported to ED for evaluation.  Her MR of brain was resulted from 2/1 while there and left small meningioma without mass effect was unchanged.  She also underwent CT of brain which did not show any concerning findings.   She was discharged from the ED Later, states she awakened that morning before PT not feeling well with nausea.  She never had any fever, diarrhea, cough or congestion, but has felt chills, esp on the day she was seen in PT.  Her COVID, influenza and RSV testing at PT that day was negative.      She is still having dizziness, but more so describes balance issues.  She has nausea at times with the dizziness and sometimes just alone.    She has not had PT for her tension headaches yet--was also having imbalance with the headache.    Denies missing her cymbalta.     Current Meds  Medication Sig   acetaminophen (TYLENOL) 500 MG tablet Take 1,000 mg by mouth every 6 (six) hours as needed for headache.   amLODipine (NORVASC) 5 MG tablet Take 1 tablet by mouth once daily   Aspirin-Salicylamide-Caffeine (BC HEADACHE PO) Take by mouth as needed.   cholecalciferol (VITAMIN D3) 25 MCG (1000 UNIT) tablet Take 5,000 Units by mouth daily.   cyclobenzaprine (FLEXERIL) 10 MG tablet 1/2 to 1 tab every 8 hours as needed for back pain   DULoxetine (CYMBALTA) 60 MG capsule Take 1 capsule (60 mg total) by mouth daily.   fluticasone (FLONASE) 50 MCG/ACT nasal spray Use 2 spray(s) in each nostril once daily   MELATONIN PO Take 1.5 mg by mouth at bedtime.   Multiple Vitamin (MULTIVITAMIN WITH MINERALS) TABS Take 1 tablet by mouth daily.   nitroGLYCERIN (NITROSTAT) 0.4 MG SL tablet Place 0.4 mg under  the tongue every 5 (five) minutes as needed.   No Known Allergies   Review of Systems    Objective:   Vitals:   01/23/24 1414  BP: (!) 138/90  Pulse: 84  Resp: 16  Height: 5\' 4"  (1.626 m)  Weight: 168 lb 8 oz (76.4 kg)  SpO2: 99%  BMI (Calculated): 28.91     Physical Exam NAD HEENT:  PERRL, EOMI, discs sharp, TMs pearly gray.  Throat without injection Neck:  Supple, traps remain tight and patient winces when muscles palpated, though she denies tenderness. Chest:  CTA CV:  RRR without murmur or rub.  Radial and DP pulses normal and equal Neuro:  A & O x 3, CN II-XII grossly intact.  Motor 5/5, DTRs 2+/4, negative symptoms or nystagmus with [place] maneuver.  Romberg negative.  Gait normal   Assessment & Plan   Suspect viral illness exacerbating her symptoms.  Encouraged pushing fluids, bland diet as tolerated.  Call if does not continue to improve.    2.  Left frontal meningioma:  stable.  3.  Headaches:  to start PT for what appeared to be at last visit tension headaches.  Continue with bite guard at night for bruxism.    4.  Depression:  continue cymbalta.  5.  Elevated BP:  bp check  in 2 weeks. Would like a month of PT for neck  before considering sending to Neuro for her headaches.

## 2024-01-24 ENCOUNTER — Ambulatory Visit: Payer: Medicaid Other | Admitting: Physical Therapy

## 2024-01-24 ENCOUNTER — Other Ambulatory Visit: Payer: Self-pay

## 2024-01-24 ENCOUNTER — Ambulatory Visit: Payer: Medicaid Other | Attending: Internal Medicine | Admitting: Physical Therapy

## 2024-01-24 DIAGNOSIS — M6281 Muscle weakness (generalized): Secondary | ICD-10-CM | POA: Diagnosis present

## 2024-01-24 DIAGNOSIS — R42 Dizziness and giddiness: Secondary | ICD-10-CM | POA: Insufficient documentation

## 2024-01-24 DIAGNOSIS — M542 Cervicalgia: Secondary | ICD-10-CM | POA: Diagnosis present

## 2024-01-24 DIAGNOSIS — R2689 Other abnormalities of gait and mobility: Secondary | ICD-10-CM | POA: Insufficient documentation

## 2024-01-24 DIAGNOSIS — R519 Headache, unspecified: Secondary | ICD-10-CM | POA: Insufficient documentation

## 2024-01-24 DIAGNOSIS — M62838 Other muscle spasm: Secondary | ICD-10-CM | POA: Insufficient documentation

## 2024-01-24 NOTE — Therapy (Signed)
OUTPATIENT PHYSICAL THERAPY CERVICAL EVALUATION     Patient Name: [name] MRN: [id number] DOB:Feb 19, 1971, 53 y.o., female Today's Date: 01/24/2024   END OF SESSION:     PT End of Session - 01/24/24 1521       Visit Number 1     Authorization Type [employer]     PT Start Time 1521     PT Stop Time 1600     PT Time Calculation (min) 39 min     Activity Tolerance Patient tolerated treatment well     Behavior During Therapy WFL for tasks assessed/performed                            Past Medical History:  Diagnosis Date   Anxiety     Benign meningioma of brain (HCC) 09/06/2022    crainiotomy   Depression     GERD (gastroesophageal reflux disease)     H/O amniotic fluid embolism 02/05/2003    in setting SVD with forceps   Headache     History of anemia     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/05/2003    DIC in setting SVD with amniotic fluid embolism   History of hypertension 2004    PIH   History of hypothyroidism      per pt yrs ago -- resolved   History of MI (myocardial infarction) 02-05-2003----  resolved    in setting PIH during pregnancy--- SVD with amniotic fluid embolism, DIC, respiratory failure with pulmonary edema (pt was venterd)   Hypertension     Irritable bowel syndrome                    Past Surgical History:  Procedure Laterality Date   ANTERIOR CRUCIATE LIGAMENT (ACL) REVISION Left 07/24/2018    Procedure: Left knee arthroscopy, debridement, allograft revision anterior cruciate ligament reconstruction,  hardware removal deep, partialmedial and lateral  menisectomy;  Surgeon: Eugenia Mcalpine, MD;  Location: [facility];  Service: Orthopedics;  Laterality: Left;  2 hrs General with adductor canal block   ANTERIOR CRUCIATE LIGAMENT REPAIR Left 04-20-2003    dr Jerl Santos   [facility]   APPLICATION OF CRANIAL NAVIGATION N/A 09/06/2022    Procedure: APPLICATION OF CRANIAL NAVIGATION;  Surgeon: Barnett Abu,  MD;  Location: [facility];  Service: Neurosurgery;  Laterality: N/A;  RM 21   CARDIAC CATHETERIZATION   07-01-2003   dr Sharyn Lull    [facility]    positive stress cardiolite:  normal coronaries,  LV w/ mild global hypokinesis, ef 50%   CRANIOTOMY Right 09/06/2022    Procedure: Right Frontal craniotomy for meningioma with stealth;  Surgeon: Barnett Abu, MD;  Location: [facility];  Service: Neurosurgery;  Laterality: Right;   CURRETTAGE FOR RETAINED PRODUCTS OF CONCEPTION   11-25-2007      @[facility]     FOR PORTPARTUM HEMORRHAGE   EXCISION MASS HEAD Right 04/01/2023    Procedure: EXCISION OF RIGHT PERI-MANDIBULAR MASS;  Surgeon: Serena Colonel, MD;  Location: [facility];  Service: ENT;  Laterality: Right;   HYSTEROSCOPY WITH NOVASURE N/A 11/27/2013    Procedure: HYSTEROSCOPY WITH NOVASURE;  Surgeon: Willodean Rosenthal, MD;  Location: [facility];  Service: Gynecology;  Laterality: N/A;   Left ACL reconstruction Left 08/2018    MCL sprain as well.   POST PARTUM TUBAL LIGATION WITH FILSHIE CLIPS   11-17-2002     dr leggett  [facility]   TRANSTHORACIC  ECHOCARDIOGRAM   05/09/2010    ef 55-60%/  trivial MR and TR/  trivial pericardial effusion   ULNAR SHORTENING WITH BONE GRAFT Right 03/28/2017    Procedure: RIGHT ULNA WRIST OSTEOTOMY;  Surgeon: Cindee Salt, MD;  Location: [facility];  Service: Orthopedics;  Laterality: Right;   WRIST ARTHROSCOPY WITH FOVEAL TRIANGULAR FIBROCARTILAGE COMPLEX REPAIR Right 09/25/2016    Procedure: Right WRIST ARTHROSCOPY WITH FOVEAL TRIANGULAR FIBROCARTILAGE COMPLEX REPAIR;  Surgeon: Cindee Salt, MD;  Location: [facility];  Service: Orthopedics;  Laterality: Right;                Patient Active Problem List    Diagnosis Date Noted   Bruxism (teeth grinding) 01/02/2024   Panic disorder 01/02/2024   Depression 11/28/2023   Anxiety 11/28/2023   Nonintractable headache 11/28/2023   Lumbar back pain with radiculopathy affecting right lower extremity 05/20/2023    Meningioma (HCC) 09/06/2022   Meningioma, cerebral (HCC) 09/06/2022   Hypercholesterolemia 08/23/2022   Mass of right parotid gland 08/23/2022   Right frontal lobe mass 08/23/2022   Facial weakness 08/23/2022   Primary hypertension 07/02/2022   Post traumatic stress disorder 07/02/2022   Elevated serum creatinine 07/02/2022   Postmenopausal bleeding 10/14/2020   Fatigue 03/08/2020   Poor sleep 03/08/2020   S/P ACL reconstruction 07/24/2018   Environmental and seasonal allergies 03/12/2017   Insomnia 12/11/2016   Pulmonary embolism complicating pregnancy 10/19/2013   Hypothyroidism 07/30/2007   MDD (major depressive disorder), recurrent episode (HCC) 07/30/2007   MYOCARDIAL INFARCTION, HX OF 07/30/2007   EMBOLISM, AMNIOTIC FLUID, DELIVERED W/PP 07/30/2007   HX, PERSONAL, SUDDEN CARDIAC ARRREST 07/30/2007      PCP: Julieanne Manson, MD   REFERRING PROVIDER: Julieanne Manson, MD   REFERRING DIAG: R51.9 (ICD-10-CM) - Nonintractable headache, unspecified chronicity pattern, unspecified headache type   THERAPY DIAG:  No diagnosis found.   Rationale for Evaluation and Treatment: Rehabilitation   ONSET DATE: 01/02/2024 (referral date); 09/06/22 brain surgery (R frontal lobe mass)   SUBJECTIVE:                                                                                                                                                                                                          SUBJECTIVE STATEMENT: Pt states since her brain surgery she has still been having really bad headaches. Daily headaches. States they happen sporadically and she gets 10/10 headache. Can last 30 min to an hour but can come back 30 min to an hour later. Pt reports a lot of right side  neck pain. States she feels that it did start with the headaches. Pt does note some R UE tingling. She reports some dizziness and decreased balance which occurred when she first had the tumor, improved after  surgery but felt it restarted again ~1 year after the surgery.    Hand dominance: Right   PERTINENT HISTORY:  Chronic back pain (getting shots for that)   PAIN:  Are you having pain? Yes: NPRS scale: 0 currently, 10 at worst  Pain location: Temple, around forehead, sometimes on the R side of temple where her surgery is at Pain description: Sporadically  Aggravating factors: Bending over Relieving factors: Sleeping pills   PRECAUTIONS: None   RED FLAGS: None                      WEIGHT BEARING RESTRICTIONS: No   FALLS:  Has patient fallen in last 6 months? 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   DIAGNOSTIC FINDINGS:  Brain MRI 01/11/24 IMPRESSION: 1. No acute or reversible finding. No abnormality seen to explain the clinical presentation. Underlying the site of the previously resected right frontal meningioma, there is a small focus of cortical volume loss and gliosis. No evidence of brain edema or enhancement. 2. 2 x 4 mm meningioma along the left frontal convexity, unchanged and without any mass-effect upon the brain. Certainly incidental at this time.   Head CT 01/20/24 IMPRESSION: 1. No evidence of acute intracranial abnormality. 2. Small meningioma better seen on recent MRI.   PATIENT SURVEYS:  Headache impact Test (HIT-6): 69   COGNITION: Overall cognitive status: Within functional limits for tasks assessed   SENSATION: Reports some R UE N/T   POSTURE: rounded shoulders   PALPATION: TTP R>L UT, mid thoracic paraspinals, SCM          CERVICAL ROM:    Active ROM A/PROM (deg) eval  Flexion 30  *  Extension 30  Right lateral flexion 38   Left lateral flexion  43 pain on R neck  Right rotation  47  Left rotation  45   (Blank rows = not tested, * = pain)   UPPER EXTREMITY ROM:   Active ROM Right eval Left eval  Shoulder flexion      Shoulder extension      Shoulder abduction      Shoulder adduction      Shoulder extension      Shoulder internal rotation      Shoulder external rotation      Elbow flexion      Elbow extension      Wrist flexion      Wrist extension      Wrist ulnar deviation      Wrist radial deviation      Wrist pronation      Wrist supination       (Blank rows = not tested)   UPPER EXTREMITY MMT:   MMT Right eval Left eval  Shoulder flexion  3+ 3+  Shoulder extension  4 4  Shoulder abduction  3+ 3+  Shoulder adduction      Shoulder extension 4-  4-  Shoulder internal rotation  4 4  Shoulder external rotation  3+ 3+  Middle trapezius  3  3  Lower trapezius      Elbow flexion      Elbow extension      Wrist flexion      Wrist extension      Wrist ulnar deviation      Wrist radial deviation      Wrist pronation      Wrist supination      Grip strength       (Blank rows = not tested)   CERVICAL SPECIAL TESTS:  Did not assess   FUNCTIONAL TESTS:  DGI: To be assessed   TREATMENT DATE:  01/24/24 See HEP below                                                                                                                                    PATIENT EDUCATION:  Education details: Exam findings, POC, initial HEP Person educated: Patient Education method: Explanation, Demonstration, and Handouts Education comprehension: verbalized understanding, returned demonstration, and needs further education   HOME EXERCISE PROGRAM: Access Code: [email or web address] URL: [email or web address] Date: 01/24/2024 Prepared by: Vernon Prey April Kirstie Peri   Exercises - Seated Scapular Retraction  - 1 x daily - 7 x weekly - 2 sets - 10 reps -  Shoulder External Rotation and Scapular Retraction  - 1 x daily - 7 x weekly - 2 sets - 10 reps - Seated Upper Trapezius Stretch  - 1 x daily - 7 x weekly - 2 sets - 30 sec hold - Sternocleidomastoid Stretch  - 1 x daily - 7 x weekly - 2 sets - 30 sec hold   ASSESSMENT:   CLINICAL IMPRESSION: Patient is a 53 year old F referred to Neuro OPPT for headache and neck pain.   Pt's PMH is significant for: brain surgery in 2023 (tumor removal) and back pain.  The following deficits were present during the exam: limited cervical ROM with trigger points most notable in UTs, SCM, and R>L periscapular muscles along with gross UE and scapular weakness impacting her postural stability. Her HIT-6 score indicates a severe disability. Pt has also noted some recurrence of her dizziness and decreased balance a few months after her tumor removal which may benefit from further assessment. Pt would benefit from trial of PT to see if decreasing/addressing her neck/shoulder pain and stability will benefit/improve her headache symptoms to maximize functional mobility independence     OBJECTIVE IMPAIRMENTS: decreased activity tolerance, decreased balance, decreased coordination, decreased mobility, decreased ROM, decreased strength, dizziness, increased muscle spasms, impaired sensation, impaired UE functional use, improper body mechanics, postural dysfunction, and pain.    ACTIVITY LIMITATIONS: carrying, lifting, bending, hygiene/grooming, and locomotion level   PARTICIPATION LIMITATIONS: meal prep, cleaning, laundry, driving, shopping, community activity, and occupation   PERSONAL FACTORS: Fitness, Past/current experiences, and Time since onset of injury/illness/exacerbation are also  affecting patient's functional outcome.    REHAB POTENTIAL: Good   CLINICAL DECISION MAKING: Unstable/unpredictable   EVALUATION COMPLEXITY: High     GOALS: Goals reviewed with patient? Yes   SHORT TERM GOALS: Target date:  02/21/2024     Pt will be ind with initial HEP Baseline:  Goal status: INITIAL   2.  PT will administer DGI to obtain baseline score  Baseline:  Goal status: INITIAL   3.  Pt will demo improved cervical ROM by at least 10 deg in each direction to demo improved muscle tension Baseline:  Goal status: INITIAL       LONG TERM GOALS: Target date: 03/20/2024     Pt will be ind with management and progression of HEP Baseline:  Goal status: INITIAL   2.  Pt will report improved HIT-6 to </=50 to demo decreased disability Baseline:  Goal status: INITIAL   3.  Pt will demo at least 4/5 bilat UE strength for increased postural stability with home tasks Baseline:  Goal status: INITIAL   4.  Pt will report at least >/=50% improvement in her headache intensity, frequency and duration Baseline:  Goal status: INITIAL   5.  Pt will have improved DGI score by at least 3 points to demo MDC Baseline:  Goal status: INITIAL       PLAN:   PT FREQUENCY: 1-2x/week (pt reports financial constraints)   PT DURATION: 6 weeks   PLANNED INTERVENTIONS: 97164- PT Re-evaluation, 97110-Therapeutic exercises, 97530- Therapeutic activity, 97112- Neuromuscular re-education, 97535- Self Care, 16109- Manual therapy, [id number]- Gait training, 97014- Electrical stimulation (unattended), [phone]- Ionotophoresis 4mg /ml Dexamethasone, Patient/Family education, Balance training, Stair training, Dry Needling, Joint mobilization, Spinal mobilization, Cryotherapy, and Moist heat   PLAN FOR NEXT SESSION: Assess response to HEP. Manual therapy as indicated to address muscle tension. Consider TPDN. Assess DGI and work on postural strengthening.      Lazara Grieser April Ma L Toshiyuki Fredell, PT, DPT 01/24/2024, 8:21 AM

## 2024-01-24 NOTE — Therapy (Addendum)
Encounter duplicate -- created by mistake. See PT Initial Evaluation created by [facility] April Ma L Shirline Kendle, PT

## 2024-01-29 ENCOUNTER — Ambulatory Visit: Payer: Medicaid Other | Admitting: Physical Therapy

## 2024-02-04 ENCOUNTER — Ambulatory Visit (HOSPITAL_COMMUNITY): Payer: Medicaid Other | Admitting: Mental Health

## 2024-02-05 ENCOUNTER — Ambulatory Visit: Payer: Medicaid Other | Admitting: Physical Therapy

## 2024-02-05 DIAGNOSIS — M6281 Muscle weakness (generalized): Secondary | ICD-10-CM

## 2024-02-05 DIAGNOSIS — R2689 Other abnormalities of gait and mobility: Secondary | ICD-10-CM

## 2024-02-05 DIAGNOSIS — M62838 Other muscle spasm: Secondary | ICD-10-CM

## 2024-02-05 DIAGNOSIS — M542 Cervicalgia: Secondary | ICD-10-CM

## 2024-02-05 DIAGNOSIS — R42 Dizziness and giddiness: Secondary | ICD-10-CM

## 2024-02-05 NOTE — Therapy (Signed)
 OUTPATIENT PHYSICAL THERAPY TREATMENT     Patient Name: [name] MRN: [id number] DOB:July 18, 1971, 53 y.o., female Today's Date: 01/24/2024   END OF SESSION:    PT End of Session - 02/05/24 1409     Visit Number 2    Authorization Type [employer]    PT Start Time 1415    PT Stop Time 1455    PT Time Calculation (min) 40 min    Activity Tolerance Patient tolerated treatment well    Behavior During Therapy WFL for tasks assessed/performed            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   DIC in setting SVD with amniotic fluid embolism   History of hypertension 2004   PIH   History of hypothyroidism    per pt yrs ago -- resolved   History of MI (myocardial infarction) 02-05-2003----  resolved   in setting PIH during pregnancy--- SVD with amniotic fluid embolism, DIC, respiratory failure with pulmonary edema (pt was venterd)   Hypertension    Irritable bowel syndrome    Past Surgical History:  Procedure Laterality Date   ANTERIOR CRUCIATE LIGAMENT (ACL) REVISION Left 07/24/2018   Procedure: Left knee arthroscopy, debridement, allograft revision anterior cruciate ligament reconstruction,  hardware removal deep, partialmedial and lateral  menisectomy;  Surgeon: Eugenia Mcalpine, MD;  Location: [facility] [place];  Service: Orthopedics;  Laterality: Left;  2 hrs General with adductor canal block   ANTERIOR CRUCIATE LIGAMENT REPAIR Left 04-20-2003    dr Jerl Santos   [facility]   APPLICATION OF CRANIAL NAVIGATION N/A 09/06/2022   Procedure: APPLICATION OF CRANIAL NAVIGATION;  Surgeon: Barnett Abu, MD;  Location: [facility] OR;  Service: Neurosurgery;  Laterality: N/A;  RM 21    CARDIAC CATHETERIZATION  07-01-2003   dr Sharyn Lull    [facility]   positive stress cardiolite:  normal coronaries,  LV w/ mild global hypokinesis, ef 50%   CRANIOTOMY Right 09/06/2022   Procedure: Right Frontal craniotomy for meningioma with stealth;  Surgeon: Barnett Abu, MD;  Location: [facility] OR;  Service: Neurosurgery;  Laterality: Right;   CURRETTAGE FOR RETAINED PRODUCTS OF CONCEPTION  11-25-2007      @[facility]    FOR PORTPARTUM HEMORRHAGE   EXCISION MASS HEAD Right 04/01/2023   Procedure: EXCISION OF RIGHT PERI-MANDIBULAR MASS;  Surgeon: Serena Colonel, MD;  Location: [facility];  Service: ENT;  Laterality: Right;   HYSTEROSCOPY WITH NOVASURE N/A 11/27/2013   Procedure: HYSTEROSCOPY WITH NOVASURE;  Surgeon: Willodean Rosenthal, MD;  Location: [facility];  Service: Gynecology;  Laterality: N/A;   Left ACL reconstruction Left 08/2018   MCL sprain as well.   POST PARTUM TUBAL LIGATION WITH FILSHIE CLIPS  11-17-2002     dr leggett  [facility]   TRANSTHORACIC ECHOCARDIOGRAM  05/09/2010   ef 55-60%/  trivial MR and TR/  trivial pericardial effusion   ULNAR SHORTENING WITH BONE GRAFT Right 03/28/2017   Procedure: RIGHT ULNA WRIST OSTEOTOMY;  Surgeon: Cindee Salt, MD;  Location: [facility];  Service: Orthopedics;  Laterality: Right;   WRIST ARTHROSCOPY WITH FOVEAL TRIANGULAR FIBROCARTILAGE COMPLEX REPAIR Right 09/25/2016   Procedure: Right WRIST  ARTHROSCOPY WITH FOVEAL TRIANGULAR FIBROCARTILAGE COMPLEX REPAIR;  Surgeon: Cindee Salt, MD;  Location: [facility];  Service: Orthopedics;  Laterality: Right;    PCP: Julieanne Manson, MD   REFERRING PROVIDER: Julieanne Manson, MD   REFERRING DIAG: R51.9 (ICD-10-CM) - Nonintractable headache, unspecified chronicity pattern, unspecified headache type   THERAPY DIAG:  No diagnosis found.   Rationale for Evaluation and Treatment: Rehabilitation   ONSET DATE: 01/02/2024 (referral date); 09/06/22 brain surgery (R frontal lobe mass)    SUBJECTIVE:                                                                                                                                                                                                          SUBJECTIVE STATEMENT: Pt reports she's been able to do her exercises daily. Pt thinks headaches have been a little better.   From eval: Pt states since her brain surgery she has still been having really bad headaches. Daily headaches. States they happen sporadically and she gets 10/10 headache. Can last 30 min to an hour but can come back 30 min to an hour later. Pt reports a lot of right side neck pain. States she feels that it did start with the headaches. Pt does note some R UE tingling. She reports some dizziness and decreased balance which occurred when she first had the tumor, improved after surgery but felt it restarted again ~1 year after the surgery.    Hand dominance: Right   PERTINENT HISTORY:  Chronic back pain (getting shots for that)   PAIN:  Are you having pain? Yes: NPRS scale: 7 currently, 10 at worst  Pain location: Temple, around forehead, sometimes on the R side of temple where her surgery is at Pain description: Sporadically  Aggravating factors: Bending over Relieving factors: Sleeping pills   PRECAUTIONS: None   WEIGHT BEARING RESTRICTIONS: No   FALLS:  Has patient fallen in last 6 months? 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   DIAGNOSTIC FINDINGS:  Brain MRI 01/11/24 IMPRESSION: 1. No acute or reversible finding. No abnormality seen to explain the clinical presentation. Underlying the site of the  previously resected right frontal meningioma, there is a small focus of cortical volume loss  and gliosis. No evidence of brain edema or enhancement. 2. 2 x 4 mm meningioma along the left frontal convexity, unchanged and without any mass-effect upon the brain. Certainly incidental at this time.   Head CT 01/20/24 IMPRESSION: 1. No evidence of acute intracranial abnormality. 2. Small meningioma better seen on recent MRI.   PATIENT SURVEYS:  Headache impact Test (HIT-6): 69  POSTURE: rounded shoulders   PALPATION: TTP R>L UT, mid thoracic paraspinals, SCM          CERVICAL ROM:    Active ROM A/PROM (deg) eval  Flexion 30 *  Extension 30  Right lateral flexion 38   Left lateral flexion  43 pain on R neck  Right rotation  47  Left rotation  45   (Blank rows = not tested, * = pain)   UPPER EXTREMITY ROM:   Active ROM Right eval Left eval  Shoulder flexion      Shoulder extension      Shoulder abduction      Shoulder adduction      Shoulder extension      Shoulder internal rotation      Shoulder external rotation      Elbow flexion      Elbow extension      Wrist flexion      Wrist extension      Wrist ulnar deviation      Wrist radial deviation      Wrist pronation      Wrist supination       (Blank rows = not tested)   UPPER EXTREMITY MMT:   MMT Right eval Left eval  Shoulder flexion  3+ 3+  Shoulder extension  4 4  Shoulder abduction  3+ 3+  Shoulder adduction      Shoulder extension 4-  4-  Shoulder internal rotation  4 4  Shoulder external rotation  3+ 3+  Middle trapezius  3  3  Lower trapezius      Elbow flexion      Elbow extension      Wrist flexion      Wrist extension      Wrist ulnar deviation      Wrist radial deviation      Wrist pronation      Wrist supination      Grip strength       (Blank rows = not tested)   CERVICAL SPECIAL TESTS:  Did not assess   FUNCTIONAL TESTS:  FGA 02/05/24: 18/30   TREATMENT DATE:   02/05/24 Manual therapy  Gentle cervical traction  Suboccipital release, STM & MFR Self care  Chirp wheel, tennis ball, golf balls for self suboccipital release at home Neuro Re-ed (in supine)  Cervical retraction x10  Scap squeeze x10  Shoulder ER yellow TB 2x10  Shoulder horizontal abd yellow TB 2x10  Shoulder ext yellow TB 2x10 Physical performance test: FGA 18/30  [facility] PT Assessment - 02/05/24 0001       Functional Gait  Assessment   Gait assessed  Yes    Gait Level Surface Walks 20 ft in less than 7 sec but greater than 5.5 sec, uses assistive device, slower speed, mild gait deviations, or deviates 6-10 in outside of the 12 in walkway width.    Change in Gait Speed Able to smoothly change walking speed without loss of balance or gait deviation. Deviate no more than 6 in outside of the 12 in walkway width.    Gait with Horizontal Head  Turns Performs head turns smoothly with slight change in gait velocity (eg, minor disruption to smooth gait path), deviates 6-10 in outside 12 in walkway width, or uses an assistive device.    Gait with Vertical Head Turns Performs task with slight change in gait velocity (eg, minor disruption to smooth gait path), deviates 6 - 10 in outside 12 in walkway width or uses assistive device    Gait and Pivot Turn Pivot turns safely within 3 sec and stops quickly with no loss of balance.    Step Over Obstacle Is able to step over one shoe box (4.5 in total height) without changing gait speed. No evidence of imbalance.    Gait with Narrow Base of Support Ambulates less than 4 steps heel to toe or cannot perform without assistance.    Gait with Eyes Closed Walks 20 ft, slow speed, abnormal gait pattern, evidence for imbalance, deviates 10-15 in outside 12 in walkway width. Requires more than 9 sec to ambulate 20 ft.    Ambulating Backwards Walks 20 ft, slow speed, abnormal gait pattern, evidence for imbalance, deviates 10-15 in outside 12 in walkway width.     Steps Alternating feet, must use rail.    Total Score 18              01/24/24 See HEP below                                                                                                                                    PATIENT EDUCATION:  Education details: Exam findings, POC, initial HEP Person educated: Patient Education method: Explanation, Demonstration, and Handouts Education comprehension: verbalized understanding, returned demonstration, and needs further education   HOME EXERCISE PROGRAM: Access Code: [email or web address] URL: [email or web address] Date: 02/05/2024 Prepared by: Vernon Prey April Kirstie Peri  Exercises - Seated Scapular Retraction  - 1 x daily - 7 x weekly - 2 sets - 10 reps - Seated Upper Trapezius Stretch  - 1 x daily - 7 x weekly - 2 sets - 30 sec hold - Sternocleidomastoid Stretch  - 1 x daily - 7 x weekly - 2 sets - 30 sec hold - Supine Suboccipital Release with Tennis Balls  - 1 x daily - 7 x weekly - 2 sets - 1 min hold - Supine Cervical Retraction with Towel  - 1 x daily - 7 x weekly - 2 sets - 10 reps - Shoulder External Rotation and Scapular Retraction with Resistance  - 1 x daily - 7 x weekly - 2 sets - 10 reps - Supine Shoulder Horizontal Abduction with Resistance  - 1 x daily - 7 x weekly - 2 sets - 10 reps - Seated Shoulder Diagonal with Resistance  - 1 x daily - 7 x weekly - 2 sets - 10 reps   ASSESSMENT:   CLINICAL IMPRESSION: Less tension in SCM today. Most of her trigger points were in her suboccipitals.  Provided education on how to perform self suboccipital release at home. Progressed postural strengthening without UT or neck compensatory motion. Pt demos fall risk based on her FGA.   From eval: Patient is a 53 year old F referred to Neuro OPPT for headache and neck pain.   Pt's PMH is significant for: brain surgery in 2023 (tumor removal) and back pain.  The following deficits were present during the exam: limited cervical ROM  with trigger points most notable in UTs, SCM, and R>L periscapular muscles along with gross UE and scapular weakness impacting her postural stability. Her HIT-6 score indicates a severe disability. Pt has also noted some recurrence of her dizziness and decreased balance a few months after her tumor removal which may benefit from further assessment. Pt would benefit from trial of PT to see if decreasing/addressing her neck/shoulder pain and stability will benefit/improve her headache symptoms to maximize functional mobility independence     OBJECTIVE IMPAIRMENTS: decreased activity tolerance, decreased balance, decreased coordination, decreased mobility, decreased ROM, decreased strength, dizziness, increased muscle spasms, impaired sensation, impaired UE functional use, improper body mechanics, postural dysfunction, and pain.      GOALS: Goals reviewed with patient? Yes   SHORT TERM GOALS: Target date: 02/21/2024     Pt will be ind with initial HEP Baseline:  Goal status: INITIAL   2.  PT will administer DGI to obtain baseline score  Baseline:  Goal status: INITIAL   3.  Pt will demo improved cervical ROM by at least 10 deg in each direction to demo improved muscle tension Baseline:  Goal status: INITIAL       LONG TERM GOALS: Target date: 03/20/2024     Pt will be ind with management and progression of HEP Baseline:  Goal status: INITIAL   2.  Pt will report improved HIT-6 to </=50 to demo decreased disability Baseline:  Goal status: INITIAL   3.  Pt will demo at least 4/5 bilat UE strength for increased postural stability with home tasks Baseline:  Goal status: INITIAL   4.  Pt will report at least >/=50% improvement in her headache intensity, frequency and duration Baseline:  Goal status: INITIAL   5.  Pt will have improved FGA to >/=22/30  Baseline:  Goal status: INITIAL       PLAN:   PT FREQUENCY: 1-2x/week (pt reports financial constraints)   PT DURATION: 6  weeks   PLANNED INTERVENTIONS: 97164- PT Re-evaluation, 97110-Therapeutic exercises, 97530- Therapeutic activity, 97112- Neuromuscular re-education, 97535- Self Care, 16109- Manual therapy, [id number]- Gait training, 97014- Electrical stimulation (unattended), [phone]- Ionotophoresis 4mg /ml Dexamethasone, Patient/Family education, Balance training, Stair training, Dry Needling, Joint mobilization, Spinal mobilization, Cryotherapy, and Moist heat   PLAN FOR NEXT SESSION: Assess response to HEP. Manual therapy as indicated to address muscle tension. Consider TPDN. Assess DGI and work on postural strengthening.      Braylynn Lewing April Ma L [place] Rollyson, PT, DPT 01/24/2024, 8:21 AM

## 2024-02-19 ENCOUNTER — Ambulatory Visit: Payer: Medicaid Other | Attending: Internal Medicine | Admitting: Physical Therapy

## 2024-02-19 ENCOUNTER — Telehealth: Payer: Self-pay

## 2024-02-19 DIAGNOSIS — M62838 Other muscle spasm: Secondary | ICD-10-CM | POA: Insufficient documentation

## 2024-02-19 DIAGNOSIS — M542 Cervicalgia: Secondary | ICD-10-CM | POA: Diagnosis present

## 2024-02-19 DIAGNOSIS — M6281 Muscle weakness (generalized): Secondary | ICD-10-CM | POA: Insufficient documentation

## 2024-02-19 DIAGNOSIS — R2689 Other abnormalities of gait and mobility: Secondary | ICD-10-CM | POA: Diagnosis present

## 2024-02-19 DIAGNOSIS — R42 Dizziness and giddiness: Secondary | ICD-10-CM | POA: Diagnosis present

## 2024-02-19 NOTE — Telephone Encounter (Signed)
 Will call patient if pcp has cancellation

## 2024-02-19 NOTE — Telephone Encounter (Signed)
 Per dr. Delrae Alfred have patient to check temp for a couple of weeks and make sure patient is taking Cymbalta daily   Patient reports taking Cymbalta daily and reports she is following up with psychiatrist. [name] to check temps daily with a thermometer for oral only

## 2024-02-19 NOTE — Therapy (Signed)
 OUTPATIENT PHYSICAL THERAPY TREATMENT     Patient Name: [name] MRN: [id number] DOB:11-Oct-1971, 53 y.o., female Today's Date: 01/24/2024   END OF SESSION:    PT End of Session - 02/19/24 1504     Visit Number 3    Authorization Type [employer]    PT Start Time 1520    PT Stop Time 1600    PT Time Calculation (min) 40 min    Activity Tolerance Patient tolerated treatment well    Behavior During Therapy WFL for tasks assessed/performed             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   DIC in setting SVD with amniotic fluid embolism   History of hypertension 2004   PIH   History of hypothyroidism    per pt yrs ago -- resolved   History of MI (myocardial infarction) 02-05-2003----  resolved   in setting PIH during pregnancy--- SVD with amniotic fluid embolism, DIC, respiratory failure with pulmonary edema (pt was venterd)   Hypertension    Irritable bowel syndrome    Past Surgical History:  Procedure Laterality Date   ANTERIOR CRUCIATE LIGAMENT (ACL) REVISION Left 07/24/2018   Procedure: Left knee arthroscopy, debridement, allograft revision anterior cruciate ligament reconstruction,  hardware removal deep, partialmedial and lateral  menisectomy;  Surgeon: Eugenia Mcalpine, MD;  Location: [facility];  Service: Orthopedics;  Laterality: Left;  2 hrs General with adductor canal block   ANTERIOR CRUCIATE LIGAMENT REPAIR Left 04-20-2003    dr Jerl Santos   [facility]   APPLICATION OF CRANIAL NAVIGATION N/A 09/06/2022   Procedure: APPLICATION OF CRANIAL NAVIGATION;  Surgeon: Barnett Abu, MD;  Location: [facility] OR;  Service: Neurosurgery;  Laterality: N/A;  RM 21    CARDIAC CATHETERIZATION  07-01-2003   dr Sharyn Lull    [facility]   positive stress cardiolite:  normal coronaries,  LV w/ mild global hypokinesis, ef 50%   CRANIOTOMY Right 09/06/2022   Procedure: Right Frontal craniotomy for meningioma with stealth;  Surgeon: Barnett Abu, MD;  Location: [facility] OR;  Service: Neurosurgery;  Laterality: Right;   CURRETTAGE FOR RETAINED PRODUCTS OF CONCEPTION  11-25-2007      @[facility]    FOR PORTPARTUM HEMORRHAGE   EXCISION MASS HEAD Right 04/01/2023   Procedure: EXCISION OF RIGHT PERI-MANDIBULAR MASS;  Surgeon: Serena Colonel, MD;  Location: [facility];  Service: ENT;  Laterality: Right;   HYSTEROSCOPY WITH NOVASURE N/A 11/27/2013   Procedure: HYSTEROSCOPY WITH NOVASURE;  Surgeon: Willodean Rosenthal, MD;  Location: [facility]S;  Service: Gynecology;  Laterality: N/A;   Left ACL reconstruction Left 08/2018   MCL sprain as well.   POST PARTUM TUBAL LIGATION WITH FILSHIE CLIPS  11-17-2002     dr leggett  [facility]   TRANSTHORACIC ECHOCARDIOGRAM  05/09/2010   ef 55-60%/  trivial MR and TR/  trivial pericardial effusion   ULNAR SHORTENING WITH BONE GRAFT Right 03/28/2017   Procedure: RIGHT ULNA WRIST OSTEOTOMY;  Surgeon: Cindee Salt, MD;  Location: [facility];  Service: Orthopedics;  Laterality: Right;   WRIST ARTHROSCOPY WITH FOVEAL TRIANGULAR FIBROCARTILAGE COMPLEX REPAIR Right 09/25/2016   Procedure: Right  WRIST ARTHROSCOPY WITH FOVEAL TRIANGULAR FIBROCARTILAGE COMPLEX REPAIR;  Surgeon: Cindee Salt, MD;  Location: [facility];  Service: Orthopedics;  Laterality: Right;    PCP: Julieanne Manson, MD   REFERRING PROVIDER: Julieanne Manson, MD   REFERRING DIAG: R51.9 (ICD-10-CM) - Nonintractable headache, unspecified chronicity pattern, unspecified headache type   THERAPY DIAG:  No diagnosis found.   Rationale for Evaluation and Treatment: Rehabilitation   ONSET DATE: 01/02/2024 (referral date); 09/06/22 brain surgery (R frontal lobe  mass)   SUBJECTIVE:                                                                                                                                                                                                          SUBJECTIVE STATEMENT: Pt states her headaches were really bad lately. Pt reports her neck was hurting too. Pt states she did get sick earlier this week so has not been able to do as much of her exercises. Pt reports it's going okay today though. More tight than pain today.   From eval: Pt states since her brain surgery she has still been having really bad headaches. Daily headaches. States they happen sporadically and she gets 10/10 headache. Can last 30 min to an hour but can come back 30 min to an hour later. Pt reports a lot of right side neck pain. States she feels that it did start with the headaches. Pt does note some R UE tingling. She reports some dizziness and decreased balance which occurred when she first had the tumor, improved after surgery but felt it restarted again ~1 year after the surgery.    Hand dominance: Right   PERTINENT HISTORY:  Chronic back pain (getting shots for that)   PAIN:  Are you having pain? Yes: NPRS scale: 0 currently, 10 at worst  Pain location: Temple, around forehead, sometimes on the R side of temple where her surgery is at Pain description: Sporadically  Aggravating factors: Bending over Relieving factors: Sleeping pills   PRECAUTIONS: None   WEIGHT BEARING RESTRICTIONS: No   FALLS:  Has patient fallen in last 6 months? 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   DIAGNOSTIC FINDINGS:  Brain  MRI 01/11/24 IMPRESSION: 1. No  acute or reversible finding. No abnormality seen to explain the clinical presentation. Underlying the site of the previously resected right frontal meningioma, there is a small focus of cortical volume loss and gliosis. No evidence of brain edema or enhancement. 2. 2 x 4 mm meningioma along the left frontal convexity, unchanged and without any mass-effect upon the brain. Certainly incidental at this time.   Head CT 01/20/24 IMPRESSION: 1. No evidence of acute intracranial abnormality. 2. Small meningioma better seen on recent MRI.   PATIENT SURVEYS:  Headache impact Test (HIT-6): 69  POSTURE: rounded shoulders   PALPATION: TTP R>L UT, mid thoracic paraspinals, SCM          CERVICAL ROM:    Active ROM A/PROM (deg) eval A/PROM 02/19/24  Flexion 30 * 35*  Extension 30 40  Right lateral flexion 38  30  Left lateral flexion  43 pain on R neck 40  Right rotation  47 54  Left rotation  45 45   (Blank rows = not tested, * = pain)   UPPER EXTREMITY MMT:   MMT Right eval Left eval  Shoulder flexion  3+ 3+  Shoulder extension  4 4  Shoulder abduction  3+ 3+  Shoulder adduction      Shoulder extension 4-  4-  Shoulder internal rotation  4 4  Shoulder external rotation  3+ 3+  Middle trapezius  3  3  Lower trapezius      Elbow flexion      Elbow extension      Wrist flexion      Wrist extension      Wrist ulnar deviation      Wrist radial deviation      Wrist pronation      Wrist supination      Grip strength       (Blank rows = not tested)   CERVICAL SPECIAL TESTS:  Did not assess   FUNCTIONAL TESTS:  FGA 02/05/24: 18/30   TREATMENT DATE:  02/19/24 Self care: Reviewed headache trigger points and corresponding stretches UT stretch x30" Suboccipital stretch x 30" Levator scap stretch x 30" Sitting  Shoulder ER yellow TB 2x10  Shoulder horizontal abd yellow TB 2x10  "W" 2x10  Shoulder diagonals yellow TB 2x10  Cervical rotation  with towel assist x30" R&L Rechecked ROM Manual therapy  Gentle cervical traction  Suboccipital release, STM & MFR upper cervical paraspinals, temporalis     PATIENT EDUCATION:  Education details: Exam findings, POC, initial HEP Person educated: Patient Education method: Explanation, Demonstration, and Handouts Education comprehension: verbalized understanding, returned demonstration, and needs further education   HOME EXERCISE PROGRAM: Access Code: [email or web address] URL: [email or web address] Date: 02/05/2024 Prepared by: Vernon Prey April Kirstie Peri  Exercises - Seated Scapular Retraction  - 1 x daily - 7 x weekly - 2 sets - 10 reps - Seated Upper Trapezius Stretch  - 1 x daily - 7 x weekly - 2 sets - 30 sec hold - Sternocleidomastoid Stretch  - 1 x daily - 7 x weekly - 2 sets - 30 sec hold - Supine Suboccipital Release with Tennis Balls  - 1 x daily - 7 x weekly - 2 sets - 1 min hold - Supine Cervical Retraction with Towel  - 1 x daily - 7 x weekly - 2 sets - 10 reps - Shoulder External Rotation and Scapular Retraction with Resistance  - 1 x daily - 7 x weekly - 2 sets - 10 reps - Supine Shoulder Horizontal  Abduction with Resistance  - 1 x daily - 7 x weekly - 2 sets - 10 reps - Seated Shoulder Diagonal with Resistance  - 1 x daily - 7 x weekly - 2 sets - 10 reps   ASSESSMENT:   CLINICAL IMPRESSION: Pt with no pain today, just tightness. Continued to work on improving cervical ROM and tightness. Worked on postural stability and midback strengthening -- pt does get fatigued quickly when performing these. Some mild increases noted in cervical ROM but still gets the most pain and pull with flexion. Educated pt on headache trigger points and corresponding places to massage and stretches.   From eval: Patient is a 53 year old F referred to Neuro OPPT for headache and neck pain.   Pt's PMH is significant for: brain surgery in 2023 (tumor removal) and back pain.  The following  deficits were present during the exam: limited cervical ROM with trigger points most notable in UTs, SCM, and R>L periscapular muscles along with gross UE and scapular weakness impacting her postural stability. Her HIT-6 score indicates a severe disability. Pt has also noted some recurrence of her dizziness and decreased balance a few months after her tumor removal which may benefit from further assessment. Pt would benefit from trial of PT to see if decreasing/addressing her neck/shoulder pain and stability will benefit/improve her headache symptoms to maximize functional mobility independence     OBJECTIVE IMPAIRMENTS: decreased activity tolerance, decreased balance, decreased coordination, decreased mobility, decreased ROM, decreased strength, dizziness, increased muscle spasms, impaired sensation, impaired UE functional use, improper body mechanics, postural dysfunction, and pain.      GOALS: Goals reviewed with patient? Yes   SHORT TERM GOALS: Target date: 02/21/2024     Pt will be ind with initial HEP Baseline:  02/19/24: Has not been consistent with HEP due to sickness Goal status: IN PROGRESS   2.  PT will administer DGI to obtain baseline score  Baseline:  02/05/24: Obtained FGA Goal status: MET   3.  Pt will demo improved cervical ROM by at least 10 deg in each direction to demo improved muscle tension Baseline:  02/19/24: See ROM chart above Goal status: IN PROGRESS       LONG TERM GOALS: Target date: 03/20/2024     Pt will be ind with management and progression of HEP Baseline:  Goal status: INITIAL   2.  Pt will report improved HIT-6 to </=50 to demo decreased disability Baseline:  Goal status: INITIAL   3.  Pt will demo at least 4/5 bilat UE strength for increased postural stability with home tasks Baseline:  Goal status: INITIAL   4.  Pt will report at least >/=50% improvement in her headache intensity, frequency and duration Baseline:  Goal status: INITIAL    5.  Pt will have improved FGA to >/=22/30  Baseline:  Goal status: INITIAL       PLAN:   PT FREQUENCY: 1-2x/week (pt reports financial constraints)   PT DURATION: 6 weeks   PLANNED INTERVENTIONS: 97164- PT Re-evaluation, 97110-Therapeutic exercises, 97530- Therapeutic activity, [id number]- Neuromuscular re-education, 97535- Self Care, 28413- Manual therapy, [id number]- Gait training, 97014- Electrical stimulation (unattended), [phone]- Ionotophoresis 4mg /ml Dexamethasone, Patient/Family education, Balance training, Stair training, Dry Needling, Joint mobilization, Spinal mobilization, Cryotherapy, and Moist heat   PLAN FOR NEXT SESSION: Assess response to HEP. Manual therapy as indicated to address muscle tension. Consider TPDN. Postural strengthening.   Sy Saintjean April Ma L Jeyden Coffelt, PT, DPT 01/24/2024, 8:21 AM

## 2024-02-19 NOTE — Telephone Encounter (Signed)
 Patient would like an appointment for , patient stated she continued to have fever , chills and body weakness. Patient was previously seen before for the same illness, but patient stated she is having this symptoms more constant now.   We will call patient if there is a cancellation.

## 2024-02-20 ENCOUNTER — Telehealth: Payer: Self-pay

## 2024-02-20 NOTE — Telephone Encounter (Signed)
 Patient called and stated she is changing pcp and would like records sent to new office.   Informed patient to either come in and fill out a release form or go to new pcp to fill their form out  to give to [facility]    Discussed information with Dr. Delrae Alfred

## 2024-02-26 ENCOUNTER — Ambulatory Visit: Payer: Medicaid Other | Admitting: Physical Therapy

## 2024-02-26 DIAGNOSIS — M542 Cervicalgia: Secondary | ICD-10-CM | POA: Diagnosis not present

## 2024-02-26 DIAGNOSIS — R2689 Other abnormalities of gait and mobility: Secondary | ICD-10-CM

## 2024-02-26 DIAGNOSIS — M62838 Other muscle spasm: Secondary | ICD-10-CM

## 2024-02-26 DIAGNOSIS — M6281 Muscle weakness (generalized): Secondary | ICD-10-CM

## 2024-02-26 NOTE — Therapy (Signed)
 OUTPATIENT PHYSICAL THERAPY TREATMENT     Patient Name: RUTH KOVICH MRN: [id number] DOB:07-Aug-1971, 53 y.o., female Today's Date: 01/24/2024   END OF SESSION:    PT End of Session - 02/26/24 1450     Visit Number 4    Authorization Type [employer]    PT Start Time 1450    PT Stop Time 1530    PT Time Calculation (min) 40 min    Activity Tolerance Patient tolerated treatment well    Behavior During Therapy WFL for tasks assessed/performed              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   DIC in setting SVD with amniotic fluid embolism   History of hypertension 2004   PIH   History of hypothyroidism    per pt yrs ago -- resolved   History of MI (myocardial infarction) 02-05-2003----  resolved   in setting PIH during pregnancy--- SVD with amniotic fluid embolism, DIC, respiratory failure with pulmonary edema (pt was venterd)   Hypertension    Irritable bowel syndrome    Past Surgical History:  Procedure Laterality Date   ANTERIOR CRUCIATE LIGAMENT (ACL) REVISION Left 07/24/2018   Procedure: Left knee arthroscopy, debridement, allograft revision anterior cruciate ligament reconstruction,  hardware removal deep, partialmedial and lateral  menisectomy;  Surgeon: Eugenia Mcalpine, MD;  Location: [facility];  Service: Orthopedics;  Laterality: Left;  2 hrs General with adductor canal block   ANTERIOR CRUCIATE LIGAMENT REPAIR Left 04-20-2003    dr Jerl Santos   [facility]   APPLICATION OF CRANIAL NAVIGATION N/A 09/06/2022   Procedure: APPLICATION OF CRANIAL NAVIGATION;  Surgeon: Barnett Abu, MD;  Location: [facility] OR;  Service: Neurosurgery;  Laterality: N/A;  RM  21   CARDIAC CATHETERIZATION  07-01-2003   dr Sharyn Lull    [facility]   positive stress cardiolite:  normal coronaries,  LV w/ mild global hypokinesis, ef 50%   CRANIOTOMY Right 09/06/2022   Procedure: Right Frontal craniotomy for meningioma with stealth;  Surgeon: Barnett Abu, MD;  Location: [facility] OR;  Service: Neurosurgery;  Laterality: Right;   CURRETTAGE FOR RETAINED PRODUCTS OF CONCEPTION  11-25-2007      @[facility]    FOR PORTPARTUM HEMORRHAGE   EXCISION MASS HEAD Right 04/01/2023   Procedure: EXCISION OF RIGHT PERI-MANDIBULAR MASS;  Surgeon: Serena Colonel, MD;  Location: [facility];  Service: ENT;  Laterality: Right;   HYSTEROSCOPY WITH NOVASURE N/A 11/27/2013   Procedure: HYSTEROSCOPY WITH NOVASURE;  Surgeon: Willodean Rosenthal, MD;  Location: [facility];  Service: Gynecology;  Laterality: N/A;   Left ACL reconstruction Left 08/2018   MCL sprain as well.   POST PARTUM TUBAL LIGATION WITH FILSHIE CLIPS  11-17-2002     dr leggett  [facility]   TRANSTHORACIC ECHOCARDIOGRAM  05/09/2010   ef 55-60%/  trivial MR and TR/  trivial pericardial effusion   ULNAR SHORTENING WITH BONE GRAFT Right 03/28/2017   Procedure: RIGHT ULNA WRIST OSTEOTOMY;  Surgeon: Cindee Salt, MD;  Location: [facility];  Service: Orthopedics;  Laterality: Right;   WRIST ARTHROSCOPY WITH FOVEAL TRIANGULAR FIBROCARTILAGE COMPLEX REPAIR Right 09/25/2016   Procedure:  Right WRIST ARTHROSCOPY WITH FOVEAL TRIANGULAR FIBROCARTILAGE COMPLEX REPAIR;  Surgeon: Cindee Salt, MD;  Location: [facility];  Service: Orthopedics;  Laterality: Right;    PCP: Julieanne Manson, MD   REFERRING PROVIDER: Julieanne Manson, MD   REFERRING DIAG: R51.9 (ICD-10-CM) - Nonintractable headache, unspecified chronicity pattern, unspecified headache type   THERAPY DIAG:  No diagnosis found.   Rationale for Evaluation and Treatment: Rehabilitation   ONSET DATE: 01/02/2024 (referral date); 09/06/22 brain surgery (R frontal lobe  mass)   SUBJECTIVE:                                                                                                                                                                                                          SUBJECTIVE STATEMENT: Pt states headache is a 6 or 7. Neck pain is "so so" (rates it a 5/10 in the back of her neck). "It seems like when my head hurts my neck hurts too." Pt states she has been doing "some of her stretches." Pt reports she has a referral to see the neurosurgeon.  From eval: Pt states since her brain surgery she has still been having really bad headaches. Daily headaches. States they happen sporadically and she gets 10/10 headache. Can last 30 min to an hour but can come back 30 min to an hour later. Pt reports a lot of right side neck pain. States she feels that it did start with the headaches. Pt does note some R UE tingling. She reports some dizziness and decreased balance which occurred when she first had the tumor, improved after surgery but felt it restarted again ~1 year after the surgery.    Hand dominance: Right   PERTINENT HISTORY:  Chronic back pain (getting shots for that)   PAIN:  Are you having pain? Yes: NPRS scale: 6-7 currently, 10 at worst  Pain location: Headache -- Temple, around forehead, sometimes on the R side of temple where her surgery is at Pain description: Sporadically  Aggravating factors: Bending over Relieving factors: Sleeping pills  5 in the back of her neck    PRECAUTIONS: None   WEIGHT BEARING RESTRICTIONS: No   FALLS:  Has patient fallen in last 6 months? 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   DIAGNOSTIC FINDINGS:  Brain MRI 01/11/24 IMPRESSION: 1. No acute or reversible finding. No abnormality seen to explain the clinical presentation. Underlying the site of the previously resected right frontal meningioma, there is a small focus of cortical volume loss and gliosis. No evidence of brain edema or enhancement. 2. 2 x 4 mm meningioma along the left frontal convexity, unchanged and without any mass-effect upon the brain. Certainly incidental at this time.   Head CT 01/20/24 IMPRESSION: 1. No evidence of acute intracranial abnormality. 2. Small meningioma better seen on recent MRI.   PATIENT SURVEYS:  Headache impact Test (HIT-6): 69  POSTURE: rounded shoulders   PALPATION: TTP R>L UT, mid thoracic paraspinals, SCM          CERVICAL ROM:    Active ROM A/PROM (deg) eval A/PROM 02/19/24  Flexion 30 * 35*  Extension 30 40  Right lateral flexion 38  30  Left lateral flexion  43 pain on R neck 40  Right rotation  47 54  Left rotation  45 45   (Blank rows = not tested, * = pain)   UPPER EXTREMITY MMT:   MMT Right eval Left eval  Shoulder flexion  3+ 3+  Shoulder extension  4 4  Shoulder abduction  3+ 3+  Shoulder adduction      Shoulder extension 4-  4-  Shoulder internal rotation  4 4  Shoulder external rotation  3+ 3+  Middle trapezius  3  3  Lower trapezius      Elbow flexion      Elbow extension      Wrist flexion      Wrist extension      Wrist ulnar deviation      Wrist radial deviation      Wrist pronation      Wrist supination      Grip strength       (Blank rows = not tested)   CERVICAL SPECIAL TESTS:  Did not assess   FUNCTIONAL TESTS:  FGA 02/05/24: 18/30   TREATMENT DATE:  02/26/24 UBE L1; 2 min fwd, 2 min bwd Manual therapy  Gentle cervical traction  Suboccipital release, STM & MFR upper cervical paraspinals, UTs, temporalis Supine  Cervical retraction x10  Cervical rotation iso 3x5"  Scap squeeze x10  Shoulder  ER red TB 2x8  "W" red 2x8  Shoulder ext red TB 2x8  Shoulder flexion WATE bar 2# x8   02/19/24 Self care: Reviewed headache trigger points and corresponding stretches UT stretch x30" Suboccipital stretch x 30" Levator scap stretch x 30" Sitting  Shoulder ER yellow TB 2x10  Shoulder horizontal abd yellow TB 2x10  "W" 2x10  Shoulder diagonals yellow TB 2x10  Cervical rotation with towel assist x30" R&L Rechecked ROM Manual therapy  Gentle cervical traction  Suboccipital release, STM & MFR upper cervical paraspinals, temporalis     PATIENT EDUCATION:  Education details: Exam findings, POC, initial HEP Person educated: Patient Education method: Explanation, Demonstration, and Handouts Education comprehension: verbalized understanding, returned demonstration, and needs further education   HOME EXERCISE PROGRAM: Access Code: [email or web address] URL: [email or web address] Date: 02/05/2024 Prepared by: Vernon Prey April Kirstie Peri  Exercises - Seated Scapular Retraction  - 1 x daily - 7 x weekly - 2 sets - 10 reps - Seated Upper Trapezius Stretch  - 1 x daily - 7 x weekly - 2 sets - 30 sec hold - Sternocleidomastoid Stretch  - 1 x daily -  7 x weekly - 2 sets - 30 sec hold - Supine Suboccipital Release with Tennis Balls  - 1 x daily - 7 x weekly - 2 sets - 1 min hold - Supine Cervical Retraction with Towel  - 1 x daily - 7 x weekly - 2 sets - 10 reps - Shoulder External Rotation and Scapular Retraction with Resistance  - 1 x daily - 7 x weekly - 2 sets - 10 reps - Supine Shoulder Horizontal Abduction with Resistance  - 1 x daily - 7 x weekly - 2 sets - 10 reps - Seated Shoulder Diagonal with Resistance  - 1 x daily - 7 x weekly - 2 sets - 10 reps   ASSESSMENT:   CLINICAL IMPRESSION: Headache 7/10 post session after strengthening but did improve after manual work. Continued to work on postural strengthening and improving midback strength without UT and neck compensation.  Challenged and fatigue with shoulder flexion using 2# bar. Able to progress to using red TB for her posterior shoulder girdle strengthening. Encouraged pt to try and do more stretching and strengthening at home. Discussed trial of dry needling and/or taping next session to help improve her muscle imbalance and increase proprioception of her posture throughout the day.  From eval: Patient is a 53 year old F referred to Neuro OPPT for headache and neck pain.   Pt's PMH is significant for: brain surgery in 2023 (tumor removal) and back pain.  The following deficits were present during the exam: limited cervical ROM with trigger points most notable in UTs, SCM, and R>L periscapular muscles along with gross UE and scapular weakness impacting her postural stability. Her HIT-6 score indicates a severe disability. Pt has also noted some recurrence of her dizziness and decreased balance a few months after her tumor removal which may benefit from further assessment. Pt would benefit from trial of PT to see if decreasing/addressing her neck/shoulder pain and stability will benefit/improve her headache symptoms to maximize functional mobility independence     OBJECTIVE IMPAIRMENTS: decreased activity tolerance, decreased balance, decreased coordination, decreased mobility, decreased ROM, decreased strength, dizziness, increased muscle spasms, impaired sensation, impaired UE functional use, improper body mechanics, postural dysfunction, and pain.      GOALS: Goals reviewed with patient? Yes   SHORT TERM GOALS: Target date: 02/21/2024     Pt will be ind with initial HEP Baseline:  02/19/24: Has not been consistent with HEP due to sickness Goal status: IN PROGRESS   2.  PT will administer DGI to obtain baseline score  Baseline:  02/05/24: Obtained FGA Goal status: MET   3.  Pt will demo improved cervical ROM by at least 10 deg in each direction to demo improved muscle tension Baseline:  02/19/24: See ROM  chart above Goal status: IN PROGRESS       LONG TERM GOALS: Target date: 03/20/2024     Pt will be ind with management and progression of HEP Baseline:  Goal status: INITIAL   2.  Pt will report improved HIT-6 to </=50 to demo decreased disability Baseline:  Goal status: INITIAL   3.  Pt will demo at least 4/5 bilat UE strength for increased postural stability with home tasks Baseline:  Goal status: INITIAL   4.  Pt will report at least >/=50% improvement in her headache intensity, frequency and duration Baseline:  Goal status: INITIAL   5.  Pt will have improved FGA to >/=22/30  Baseline:  Goal status: INITIAL  PLAN:   PT FREQUENCY: 1-2x/week (pt reports financial constraints)   PT DURATION: 6 weeks   PLANNED INTERVENTIONS: 97164- PT Re-evaluation, 97110-Therapeutic exercises, 97530- Therapeutic activity, [id number]- Neuromuscular re-education, 97535- Self Care, 95621- Manual therapy, [id number]- Gait training, 97014- Electrical stimulation (unattended), [phone]- Ionotophoresis 4mg /ml Dexamethasone, Patient/Family education, Balance training, Stair training, Dry Needling, Joint mobilization, Spinal mobilization, Cryotherapy, and Moist heat   PLAN FOR NEXT SESSION: Assess response to HEP. Manual therapy as indicated to address muscle tension. Consider TPDN. Postural strengthening.   Janetta Vandoren April Ma L Fraidy Mccarrick, PT, DPT 01/24/2024, 8:21 AM

## 2024-02-26 NOTE — Patient Instructions (Signed)

## 2024-03-02 NOTE — Telephone Encounter (Signed)
 Per [place], Patient called on 02/20/2024. And stated that she will be changing PCP.

## 2024-03-04 ENCOUNTER — Ambulatory Visit: Payer: Medicaid Other | Admitting: Physical Therapy

## 2024-03-31 ENCOUNTER — Ambulatory Visit (HOSPITAL_COMMUNITY): Payer: Medicaid Other | Admitting: Mental Health

## 2024-03-31 ENCOUNTER — Encounter (HOSPITAL_COMMUNITY): Payer: Self-pay

## 2024-04-14 ENCOUNTER — Other Ambulatory Visit: Payer: Medicaid Other

## 2024-04-16 ENCOUNTER — Encounter: Payer: Medicaid Other | Admitting: Internal Medicine

## 2024-04-20 ENCOUNTER — Encounter: Payer: Self-pay | Admitting: Neurology

## 2024-05-07 ENCOUNTER — Ambulatory Visit: Payer: Self-pay | Admitting: Nurse Practitioner

## 2024-06-01 ENCOUNTER — Ambulatory Visit (INDEPENDENT_AMBULATORY_CARE_PROVIDER_SITE_OTHER): Admitting: Mental Health

## 2024-06-01 DIAGNOSIS — F332 Major depressive disorder, recurrent severe without psychotic features: Secondary | ICD-10-CM

## 2024-06-01 DIAGNOSIS — F4310 Post-traumatic stress disorder, unspecified: Secondary | ICD-10-CM

## 2024-06-01 NOTE — Progress Notes (Unsigned)
 THERAPIST PROGRESS NOTE Virtual Visit via Video Note  I connected with [name] on 06/01/24 at  2:00 PM EDT by a video enabled telemedicine application and verified that I am speaking with the correct person using two identifiers.  Location: Patient: home address on file Provider: home office   I discussed the limitations of evaluation and management by telemedicine and the availability of in person appointments. The patient expressed understanding and agreed to proceed.  I discussed the assessment and treatment plan with the patient. The patient was provided an opportunity to ask questions and all were answered. The patient agreed with the plan and demonstrated an understanding of the instructions.   The patient was advised to call back or seek an in-person evaluation if the symptoms worsen or if the condition fails to improve as anticipated.  I provided 48 minutes of non-face-to-face time during this encounter.   [name], [facility]   Session Time: 2:03 pm (48 minutes)  Participation Level: Active  Behavioral Response: CasualAlertDysphoric  Type of Therapy: Individual Therapy  Treatment Goals addressed:  STG: [name] will increase management of depression and anxiety AEB ability to reframe maladaptive thinking patterns with engagement in community and/or enjoyable activity weekly within the next 90 days.  Active     OP Depression     STG: [name] will increase management of depression and anxiety AEB ability to reframe maladaptive thinking patterns with engagement in community and/or enjoyable activity weekly within the next 90 days.  (Progressing)     Start:  05/13/23    Expected End:  12/08/24       06/01/2024:  I am doing much better. [name] shares for there to have been improvements in her mood since last engagement in OPT. Reports to be engaged in church x 3 times weekly and attended a community church event this past week. Shares appetite and sleep  has improved. Notes to feel happier and is able to smile and laugh. Shares ongoing feelings of grief missing daughter with feelings of guilt with work to reframe distorted thoughts of grief and depression.       LTG: [name] will improve level of functioning AEB PHQ score of 5 or less within the next 6 months  (Progressing)     Start:  05/13/23    Expected End:  12/08/24         Victoria will identify  personal goals for managing depression symptoms to work on during the current treatment episode     Start:  03/04/23            ProgressTowards Goals: Progressing  Interventions: Supportive  Summary: [name] is a 53y.o. female who presents with dx of MDD and PTSD. Notes chief complaint of depression, anxiety and feelings of grief. Presents alert and oriented; mood and affect adequate. Speech clear and coherent at normal rate and tone. Engaged and receptive to interventions. Shares feelings of ongoing depression and grief; noting depression can be 8/10 (10 the worst) on some days secondary to feelings of grief and missing daughter. Shares to feel as if moods have improved and shares increased in engagement in community sharing to attend church x 3 weekly for service, bible study and choir practice. Shares has joined choir and feels this has been good for her. Shares to have also attended a church picnic she was initially hesitant to go but glad she did. Shares for appetite to have increased. Notes ongoing distorted negative thoughts at times of concern  if she was a good mother and feelings of guilt of choice to not let daughter reside with her. Shares has been trying to write music and practice singing as well as going for walks with daughter for means of coping. Engages with therapist to challenged distorted thoughts of grief and shares would like to go continue to work on mental health increase and stability. Denies safety concerns.    Suicidal/Homicidal: Nowithout  intent/plan  Therapist Response: Therapist engaged Radiation protection practitioner in therapy session. Completed check in and assessed for current level of functioning sxs management and level of stressors. Provided safe space to share thoughts and feelings and thoughts of daughter who passed away. Engaged in CBT strategies.  Supported in processing maladaptive thoughts related to grief experienced and support in normalizing feelings of of grief and episodes of depression. Explores behavioral changes that have supported in increase in mood and level of functioning. Explored behavioral activation and role avoidance can play in maintaining feelings of depression and anxiety. Encouraged engagement with others and discouraged isolative behaviors and encouraged reaching out to natural supports as needed. Reviewed and discussed PHQ and GAD scores. Reviewed session and provided follow up.      06/01/2024    2:43 PM 03/04/2023    9:25 AM 08/28/2021    5:31 PM 03/15/2021    3:50 PM  GAD 7 : Generalized Anxiety Score  Nervous, Anxious, on Edge 1 2 2 1   Control/stop worrying 1 2 2 2   Worry too much - different things 1 2 2 2   Trouble relaxing 1 2 1 1   Restless 1 2 2 2   Easily annoyed or irritable 1 2 3 2   Afraid - awful might happen 2 2 2 2   Total GAD 7 Score 8 14 14 12   Anxiety Difficulty Somewhat difficult Very difficult        06/01/2024    2:44 PM 03/04/2023    9:25 AM 08/28/2021    5:30 PM 03/15/2021    3:50 PM 11/08/2016    8:57 AM  Depression screen PHQ 2/9  Decreased Interest 1 2 1 1 2   Down, Depressed, Hopeless 0 2 2 2 2   PHQ - 2 Score 1 4 3 3 4   Altered sleeping 1 2 1 2 3   Tired, decreased energy 1 2 2 1 3   Change in appetite 1 2 2 2 3   Feeling bad or failure about yourself  1 2 3 1 2   Trouble concentrating 1 2 1 2 2   Moving slowly or fidgety/restless 1 2 0 1 3  Suicidal thoughts 0 0 1 1 2    PHQ-9 Score 7 16 13 13 22   Difficult doing work/chores Somewhat difficult Very difficult        Data saved with a  previous flowsheet row definition     Plan: Return again in  x 7 weeks.  Diagnosis: Severe episode of recurrent major depressive disorder, without psychotic features (HCC)  PTSD (post-traumatic stress disorder)  Collaboration of Care: Other None  Patient/Guardian was advised Release of Information must be obtained prior to any record release in order to collaborate their care with an outside provider. Patient/Guardian was advised if they have not already done so to contact the registration department to sign all necessary forms in order for us  to release information regarding their care.   Consent: Patient/Guardian gives verbal consent for treatment and assignment of benefits for services provided during this visit. Patient/Guardian expressed understanding and agreed to proceed.   [name],  [facility] 06/01/2024

## 2024-07-03 ENCOUNTER — Other Ambulatory Visit: Payer: Self-pay | Admitting: Physician Assistant

## 2024-07-03 DIAGNOSIS — Z1231 Encounter for screening mammogram for malignant neoplasm of breast: Secondary | ICD-10-CM

## 2024-07-06 ENCOUNTER — Encounter: Payer: Self-pay | Admitting: Neurology

## 2024-07-23 ENCOUNTER — Telehealth: Payer: Self-pay

## 2024-07-23 ENCOUNTER — Ambulatory Visit: Payer: Self-pay | Admitting: Nurse Practitioner

## 2024-07-23 NOTE — Telephone Encounter (Signed)
 TOO MANY NO SHOWS DO NOT RESCHEDULE APPT

## 2024-07-27 ENCOUNTER — Ambulatory Visit (HOSPITAL_COMMUNITY): Admitting: Mental Health

## 2024-07-27 ENCOUNTER — Encounter (HOSPITAL_COMMUNITY): Payer: Self-pay
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
## 2024-08-25 ENCOUNTER — Telehealth: Payer: Self-pay | Admitting: Neurology

## 2024-08-25 ENCOUNTER — Telehealth: Payer: Self-pay

## 2024-08-25 ENCOUNTER — Encounter: Payer: Self-pay | Admitting: Neurology

## 2024-08-25 ENCOUNTER — Other Ambulatory Visit (HOSPITAL_COMMUNITY): Payer: Self-pay

## 2024-08-25 ENCOUNTER — Ambulatory Visit (INDEPENDENT_AMBULATORY_CARE_PROVIDER_SITE_OTHER): Admitting: Neurology

## 2024-08-25 VITALS — BP 130/84 | HR 71 | Wt 159.0 lb

## 2024-08-25 DIAGNOSIS — Z8249 Family history of ischemic heart disease and other diseases of the circulatory system: Secondary | ICD-10-CM | POA: Diagnosis not present

## 2024-08-25 DIAGNOSIS — G43709 Chronic migraine without aura, not intractable, without status migrainosus: Secondary | ICD-10-CM | POA: Diagnosis not present

## 2024-08-25 DIAGNOSIS — D320 Benign neoplasm of cerebral meninges: Secondary | ICD-10-CM | POA: Diagnosis not present

## 2024-08-25 MED ORDER — AIMOVIG 140 MG/ML ~~LOC~~ SOAJ: 140.0000 mg | SUBCUTANEOUS | 5 refills | Status: DC

## 2024-08-25 MED ORDER — ONDANSETRON HCL 4 MG PO TABS: 4.0000 mg | ORAL_TABLET | Freq: Three times a day (TID) | ORAL | 5 refills | Status: DC | PRN

## 2024-08-25 MED ORDER — RIZATRIPTAN BENZOATE 10 MG PO TABS: 10.0000 mg | ORAL_TABLET | ORAL | 5 refills | Status: DC | PRN

## 2024-08-25 NOTE — Patient Instructions (Addendum)
  CTA of head Start Aimovig  140mg  injection every 28 days.  Contact us  in 3 months with update Take rizatriptan  at earliest onset of headache.  May repeat dose once in 2 hours if needed.  Maximum 2 tablets in 24 hours. Take ondansetron   Must stop BC powder Limit use of pain relievers to no more than 9 days out of the month.  These medications include acetaminophen , NSAIDs (ibuprofen /Advil /Motrin , naproxen /Aleve , triptans (Imitrex/sumatriptan), Excedrin, and narcotics.  This will help reduce risk of rebound headaches. Be aware of common food triggers:  - Caffeine:  coffee, black tea, cola, Mt. Dew  - Chocolate  - Dairy:  aged cheeses (brie, blue, cheddar, gouda, Parmasan, provolone, romano, Swiss, etc), chocolate milk, buttermilk, sour cream, limit eggs and yogurt  - Nuts, peanut butter  - Alcohol  - Cereals/grains:  FRESH breads (fresh bagels, sourdough, doughnuts), yeast productions  - Processed/canned/aged/cured meats (pre-packaged deli meats, hotdogs)  - MSG/glutamate:  soy sauce, flavor enhancer, pickled/preserved/marinated foods  - Sweeteners:  aspartame (Equal, Nutrasweet).  Sugar and Splenda are okay  - Vegetables:  legumes (lima beans, lentils, snow peas, fava beans, pinto peans, peas, garbanzo beans), sauerkraut, onions, olives, pickles  - Fruit:  avocados, bananas, citrus fruit (orange, lemon, grapefruit), mango  - Other:  Frozen meals, macaroni and cheese Routine exercise Stay adequately hydrated (aim for 64 oz water daily) Keep headache diary Maintain proper stress management Maintain proper sleep hygiene Do not skip meals Consider supplements:  magnesium citrate 400mg  daily, riboflavin 400mg  daily, coenzyme Q10 100mg  three times daily.

## 2024-08-25 NOTE — Telephone Encounter (Signed)
 Pharmacy Patient Advocate Encounter  Received notification from [employer] that Prior Authorization for AIMOVIG  140MG  has been APPROVED from 9.16.25 to 12.15.25. Ran test claim, Copay is $4. This test claim was processed through [facility]- copay amounts may vary at other pharmacies due to pharmacy/plan contracts, or as the patient moves through the different stages of their insurance plan.   PA #/Case ID/Reference #: [id number]

## 2024-08-25 NOTE — Addendum Note (Signed)
 Addended by: TERRIL CHARLIES MATSU on: 08/25/2024 01:43 PM   Modules accepted: Orders

## 2024-08-25 NOTE — Telephone Encounter (Signed)
 Pharmacy Patient Advocate Encounter   Received notification from Pt Calls Messages that prior authorization for AIMOVIG  140MG  is required/requested.   Insurance verification completed.   The patient is insured through [employer] .   Per test claim: PA required; PA submitted to above mentioned insurance via Latent Key/confirmation #/EOC [id number] Status is pending

## 2024-08-25 NOTE — Telephone Encounter (Signed)
 Pt just called and stated that she read that hair loss is a side effect from the prescription called Aimovig  she was prescribed. She wants to know is there another prescription she can be prescribe without hair loss. Thanks

## 2024-08-25 NOTE — Telephone Encounter (Signed)
 PA has been submitted.

## 2024-08-26 ENCOUNTER — Encounter: Payer: Self-pay | Admitting: Neurology

## 2024-08-26 NOTE — Telephone Encounter (Signed)
 Called pt and left message on voice mail that Aimovig  was approved .

## 2024-08-26 NOTE — Telephone Encounter (Signed)
 Called patient and informed her of Dr. Jayme response and advice. Patient verbalize understating and will give it a try. Pt aware to call us  if she experiences any side effects. Patient verbalize understanding and had no further questions concerns.

## 2024-08-27 ENCOUNTER — Other Ambulatory Visit (HOSPITAL_COMMUNITY): Payer: Self-pay

## 2024-09-01 ENCOUNTER — Ambulatory Visit
Admission: RE | Admit: 2024-09-01 | Discharge: 2024-09-01 | Disposition: A | Discharge status: Home / Self Care | Source: Ambulatory Visit | Attending: Neurology

## 2024-09-01 DIAGNOSIS — G43709 Chronic migraine without aura, not intractable, without status migrainosus: Secondary | ICD-10-CM

## 2024-09-01 DIAGNOSIS — D320 Benign neoplasm of cerebral meninges: Secondary | ICD-10-CM

## 2024-09-01 DIAGNOSIS — Z8249 Family history of ischemic heart disease and other diseases of the circulatory system: Secondary | ICD-10-CM

## 2024-09-01 MED ORDER — IOPAMIDOL (ISOVUE-370) INJECTION 76%
75.0000 mL | Freq: Once | INTRAVENOUS | Status: AC | PRN
  Administered 2024-09-01: 75 mL via INTRAVENOUS

## 2024-09-02 ENCOUNTER — Emergency Department (HOSPITAL_COMMUNITY)

## 2024-09-02 ENCOUNTER — Other Ambulatory Visit: Payer: Self-pay

## 2024-09-02 ENCOUNTER — Emergency Department (HOSPITAL_COMMUNITY)
Admission: EM | Admit: 2024-09-02 | Discharge: 2024-09-02 | Disposition: A | Discharge status: Home / Self Care | Attending: Emergency Medicine | Admitting: Emergency Medicine

## 2024-09-02 ENCOUNTER — Ambulatory Visit (INDEPENDENT_AMBULATORY_CARE_PROVIDER_SITE_OTHER)

## 2024-09-02 ENCOUNTER — Encounter (HOSPITAL_COMMUNITY): Payer: Self-pay | Admitting: Emergency Medicine

## 2024-09-02 ENCOUNTER — Encounter (HOSPITAL_COMMUNITY): Payer: Self-pay | Admitting: Pharmacy Technician

## 2024-09-02 ENCOUNTER — Ambulatory Visit (HOSPITAL_COMMUNITY)
Admission: EM | Admit: 2024-09-02 | Discharge: 2024-09-02 | Disposition: A | Discharge status: Home / Self Care | Attending: Physician Assistant | Admitting: Physician Assistant

## 2024-09-02 DIAGNOSIS — I2510 Atherosclerotic heart disease of native coronary artery without angina pectoris: Secondary | ICD-10-CM | POA: Insufficient documentation

## 2024-09-02 DIAGNOSIS — M546 Pain in thoracic spine: Secondary | ICD-10-CM

## 2024-09-02 DIAGNOSIS — R079 Chest pain, unspecified: Secondary | ICD-10-CM | POA: Diagnosis not present

## 2024-09-02 DIAGNOSIS — R0602 Shortness of breath: Secondary | ICD-10-CM | POA: Diagnosis present

## 2024-09-02 DIAGNOSIS — Z7982 Long term (current) use of aspirin: Secondary | ICD-10-CM | POA: Insufficient documentation

## 2024-09-02 DIAGNOSIS — Z79899 Other long term (current) drug therapy: Secondary | ICD-10-CM | POA: Diagnosis not present

## 2024-09-02 DIAGNOSIS — R0600 Dyspnea, unspecified: Secondary | ICD-10-CM | POA: Diagnosis not present

## 2024-09-02 DIAGNOSIS — M549 Dorsalgia, unspecified: Secondary | ICD-10-CM | POA: Insufficient documentation

## 2024-09-02 DIAGNOSIS — I10 Essential (primary) hypertension: Secondary | ICD-10-CM | POA: Diagnosis not present

## 2024-09-02 LAB — RESP PANEL BY RT-PCR (RSV, FLU A&B, COVID)  RVPGX2
Influenza A by PCR: NEGATIVE
Influenza B by PCR: NEGATIVE
Resp Syncytial Virus by PCR: NEGATIVE
SARS Coronavirus 2 by RT PCR: NEGATIVE

## 2024-09-02 LAB — CBC WITH DIFFERENTIAL/PLATELET
Abs Immature Granulocytes: 0.02 K/uL (ref 0.00–0.07)
Basophils Absolute: 0 K/uL (ref 0.0–0.1)
Basophils Relative: 1 %
Eosinophils Absolute: 0.1 K/uL (ref 0.0–0.5)
Eosinophils Relative: 2 %
HCT: 46.5 % — ABNORMAL HIGH (ref 36.0–46.0)
Hemoglobin: 14.9 g/dL (ref 12.0–15.0)
Immature Granulocytes: 0 %
Lymphocytes Relative: 26 %
Lymphs Abs: 2 K/uL (ref 0.7–4.0)
MCH: 30.4 pg (ref 26.0–34.0)
MCHC: 32 g/dL (ref 30.0–36.0)
MCV: 94.9 fL (ref 80.0–100.0)
Monocytes Absolute: 0.5 K/uL (ref 0.1–1.0)
Monocytes Relative: 6 %
Neutro Abs: 5.1 K/uL (ref 1.7–7.7)
Neutrophils Relative %: 65 %
Platelets: 315 K/uL (ref 150–400)
RBC: 4.9 MIL/uL (ref 3.87–5.11)
RDW: 14.4 % (ref 11.5–15.5)
WBC: 7.7 K/uL (ref 4.0–10.5)
nRBC: 0 % (ref 0.0–0.2)

## 2024-09-02 LAB — HEPATIC FUNCTION PANEL
ALT: 16 U/L (ref 0–44)
AST: 22 U/L (ref 15–41)
Albumin: 4 g/dL (ref 3.5–5.0)
Alkaline Phosphatase: 59 U/L (ref 38–126)
Bilirubin, Direct: 0.2 mg/dL (ref 0.0–0.2)
Indirect Bilirubin: 0.7 mg/dL (ref 0.3–0.9)
Total Bilirubin: 0.9 mg/dL (ref 0.0–1.2)
Total Protein: 6.9 g/dL (ref 6.5–8.1)

## 2024-09-02 LAB — TROPONIN I (HIGH SENSITIVITY)
Troponin I (High Sensitivity): 3 ng/L (ref <18)
Troponin I (High Sensitivity): 3 ng/L (ref <18)

## 2024-09-02 LAB — LIPASE, BLOOD: Lipase: 30 U/L (ref 11–51)

## 2024-09-02 LAB — BRAIN NATRIURETIC PEPTIDE: B Natriuretic Peptide: 9.2 pg/mL (ref 0.0–100.0)

## 2024-09-02 LAB — BASIC METABOLIC PANEL WITH GFR
Anion gap: 13 (ref 5–15)
BUN: 12 mg/dL (ref 6–20)
CO2: 22 mmol/L (ref 22–32)
Calcium: 10.2 mg/dL (ref 8.9–10.3)
Chloride: 106 mmol/L (ref 98–111)
Creatinine, Ser: 1.01 mg/dL — ABNORMAL HIGH (ref 0.44–1.00)
GFR, Estimated: >60 mL/min (ref >60)
Glucose, Bld: 91 mg/dL (ref 70–99)
Potassium: 3.5 mmol/L (ref 3.5–5.1)
Sodium: 141 mmol/L (ref 135–145)

## 2024-09-02 MED ORDER — CYCLOBENZAPRINE HCL 10 MG PO TABS: 10.0000 mg | ORAL_TABLET | Freq: Two times a day (BID) | ORAL | 0 refills | Status: AC | PRN

## 2024-09-02 MED ORDER — IBUPROFEN 600 MG PO TABS: 600.0000 mg | ORAL_TABLET | Freq: Four times a day (QID) | ORAL | 0 refills | Status: AC | PRN

## 2024-09-02 MED ORDER — IOHEXOL 350 MG/ML SOLN
75.0000 mL | Freq: Once | INTRAVENOUS | Status: AC | PRN
  Administered 2024-09-02: 75 mL via INTRAVENOUS

## 2024-09-02 MED ORDER — MORPHINE SULFATE (PF) 4 MG/ML IV SOLN
4.0000 mg | Freq: Once | INTRAVENOUS | Status: AC
  Administered 2024-09-02: 4 mg via INTRAVENOUS
  Filled 2024-09-02: qty 1

## 2024-09-02 NOTE — ED Triage Notes (Signed)
 Symptoms started this morning.  Patient reports pain in chest and back with breathing.  Points to center chest and right shoulder blade as areas that have pain with breathing.    Has not taken any medications for symptoms today.  Patient now reports nausea, poor appetite.  Has history of blood clots and reports this is how she felt the last episode

## 2024-09-02 NOTE — Discharge Instructions (Signed)
 Your symptoms is likely due to an acute back spasm.  May take medication prescribed.  Follow-up closely with your doctor for further care.  Return to the ER if your condition worsen or if you have other concern.

## 2024-09-02 NOTE — ED Notes (Signed)
..  Patient is A&Ox4 upon discharge. Patient verbalized understanding of prescriptions, discharge instructions and follow-up care. Patient ambulatory from ED with steady gait.

## 2024-09-02 NOTE — ED Notes (Signed)
 Patient reports her headache is gone following medication. Also says it gave her the ability to sleep and is resting comfortably

## 2024-09-02 NOTE — ED Notes (Signed)
 Report given to Megan, CN at [facility]

## 2024-09-02 NOTE — ED Notes (Signed)
 Transported to [place].

## 2024-09-02 NOTE — ED Notes (Signed)
 Patient is being discharged from the Urgent Care and sent to the Emergency Department via [facility] . Per provider, patient is in need of higher level of care due to SOB. Patient is aware and verbalizes understanding of plan of care.  Vitals:   09/02/24 1318 09/02/24 1325  BP:  137/76  Pulse: 88 81  Resp: (!) 30 (!) 24  Temp:  98.2 F (36.8 C)  SpO2: 99% 99%

## 2024-09-02 NOTE — ED Provider Notes (Addendum)
 [facility]    CSN: [id number] Arrival date & time: 09/02/24  1243      History   Chief Complaint Chief Complaint  Patient presents with   Shortness of Breath    HPI [name] is a 53 y.o. female.   Patient here for evaluation of shortness of breath x today.  She reports history of blood clot 20 years ago after childbirth.  She states it feels similar to that.  She reports chest pain with deep inspiration, fever, nausea, myalgia.  No sick contacts.    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   DIC in setting SVD with amniotic fluid embolism   History of hypertension 2004   PIH   History of hypothyroidism    per pt yrs ago -- resolved   History of MI (myocardial infarction) 02-05-2003----  resolved   in setting PIH during pregnancy--- SVD with amniotic fluid embolism, DIC, respiratory failure with pulmonary edema (pt was venterd)   Hypertension    Irritable bowel syndrome     Patient Active Problem List   Diagnosis Date Noted   Bruxism (teeth grinding) 01/02/2024   Panic disorder 01/02/2024   Depression 11/28/2023   Anxiety 11/28/2023   Nonintractable headache 11/28/2023   Lumbar back pain with radiculopathy affecting right lower extremity 05/20/2023   Meningioma (HCC) 09/06/2022   Meningioma, cerebral (HCC) 09/06/2022   Hypercholesterolemia 08/23/2022   Mass of right parotid gland 08/23/2022   Right frontal lobe mass 08/23/2022   Facial weakness 08/23/2022   Primary hypertension 07/02/2022   Post traumatic stress disorder 07/02/2022   Elevated serum creatinine 07/02/2022   Postmenopausal bleeding 10/14/2020   Fatigue 03/08/2020   Poor sleep  03/08/2020   S/P ACL reconstruction 07/24/2018   Environmental and seasonal allergies 03/12/2017   Insomnia 12/11/2016   Pulmonary embolism complicating pregnancy 10/19/2013   Hypothyroidism 07/30/2007   MDD (major depressive disorder), recurrent episode 07/30/2007   MYOCARDIAL INFARCTION, HX OF 07/30/2007   EMBOLISM, AMNIOTIC FLUID, DELIVERED W/PP 07/30/2007   HX, PERSONAL, SUDDEN CARDIAC ARRREST 07/30/2007    Past Surgical History:  Procedure Laterality Date   ANTERIOR CRUCIATE LIGAMENT (ACL) REVISION Left 07/24/2018   Procedure: Left knee arthroscopy, debridement, allograft revision anterior cruciate ligament reconstruction,  hardware removal deep, partialmedial and lateral  menisectomy;  Surgeon: Gerome Charleston, MD;  Location: [facility];  Service: Orthopedics;  Laterality: Left;  2 hrs General with adductor canal block   ANTERIOR CRUCIATE LIGAMENT REPAIR Left 04-20-2003    dr sheril   [facility]   APPLICATION OF CRANIAL NAVIGATION N/A 09/06/2022   Procedure: APPLICATION OF CRANIAL NAVIGATION;  Surgeon: Colon Shove, MD;  Location: [facility];  Service: Neurosurgery;  Laterality: N/A;  RM 21   CARDIAC CATHETERIZATION  07-01-2003   dr levern    [facility]   positive stress cardiolite:  normal coronaries,  LV w/ mild global hypokinesis, ef 50%   CRANIOTOMY Right 09/06/2022   Procedure: Right Frontal craniotomy for meningioma with stealth;  Surgeon: Colon Shove, MD;  Location: [facility] OR;  Service: Neurosurgery;  Laterality: Right;   CURRETTAGE FOR RETAINED PRODUCTS OF CONCEPTION  11-25-2007      @  [facility]   FOR PORTPARTUM HEMORRHAGE   EXCISION MASS HEAD Right 04/01/2023   Procedure: EXCISION OF RIGHT PERI-MANDIBULAR MASS;  Surgeon: Jesus Oliphant, MD;  Location: [facility];  Service: ENT;  Laterality: Right;   HYSTEROSCOPY WITH NOVASURE N/A 11/27/2013   Procedure: HYSTEROSCOPY WITH NOVASURE;  Surgeon: Elveria Mungo, MD;  Location: [facility];  Service: Gynecology;  Laterality:  N/A;   Left ACL reconstruction Left 08/2018   MCL sprain as well.   POST PARTUM TUBAL LIGATION WITH FILSHIE CLIPS  11-17-2002     dr leggett  [facility]   TRANSTHORACIC ECHOCARDIOGRAM  05/09/2010   ef 55-60%/  trivial MR and TR/  trivial pericardial effusion   ULNAR SHORTENING WITH BONE GRAFT Right 03/28/2017   Procedure: RIGHT ULNA WRIST OSTEOTOMY;  Surgeon: Arley Curia, MD;  Location: [facility];  Service: Orthopedics;  Laterality: Right;   WRIST ARTHROSCOPY WITH FOVEAL TRIANGULAR FIBROCARTILAGE COMPLEX REPAIR Right 09/25/2016   Procedure: Right WRIST ARTHROSCOPY WITH FOVEAL TRIANGULAR FIBROCARTILAGE COMPLEX REPAIR;  Surgeon: Arley Curia, MD;  Location:  SURGERY CENTER;  Service: Orthopedics;  Laterality: Right;    OB History     Gravida  6   Para  5   Term  5   Preterm  0   AB  1   Living  5      SAB  1   IAB  0   Ectopic  0   Multiple  0   Live Births               Home Medications    Prior to Admission medications   Medication Sig Start Date End Date Taking? Authorizing Provider  acetaminophen  (TYLENOL ) 500 MG tablet Take 1,000 mg by mouth every 6 (six) hours as needed for headache.    [provider]  amLODipine  (NORVASC ) 5 MG tablet Take 1 tablet by mouth once daily 02/22/23   Adella Norris, MD  Aspirin -Salicylamide-Caffeine (BC HEADACHE PO) Take by mouth as needed.    [provider]  cholecalciferol (VITAMIN D3) 25 MCG (1000 UNIT) tablet Take 5,000 Units by mouth daily.    [provider]  cyclobenzaprine  (FLEXERIL ) 10 MG tablet 1/2 to 1 tab every 8 hours as needed for back pain 05/20/23   Adella Norris, MD  Erenumab -aooe (AIMOVIG ) 140 MG/ML SOAJ Inject 140 mg into the skin every 28 (twenty-eight) days. 08/25/24   Skeet Juliene SAUNDERS, DO  fluticasone  (FLONASE ) 50 MCG/ACT nasal spray Use 2 spray(s) in each nostril once daily 03/05/23   Adella Norris, MD  gabapentin  (NEURONTIN ) 300 MG capsule Take 300 mg by  mouth 2 (two) times daily.    [provider]  MELATONIN PO Take 1.5 mg by mouth at bedtime.    [provider]  Multiple Vitamin (MULTIVITAMIN WITH MINERALS) TABS Take 1 tablet by mouth daily.    [provider]  nitroGLYCERIN  (NITROSTAT ) 0.4 MG SL tablet Place 0.4 mg under the tongue every 5 (five) minutes as needed.    [provider]  ondansetron  (ZOFRAN ) 4 MG tablet Take 1 tablet (4 mg total) by mouth every 8 (eight) hours as needed. 08/25/24   Skeet Juliene SAUNDERS, DO  rizatriptan  (MAXALT ) 10 MG tablet Take 1 tablet (10 mg total) by mouth as needed for migraine. May repeat in 2 hours if needed.  Maximum 2 tablets in 24 hours 08/25/24   Skeet Juliene SAUNDERS, DO    Family History Family History  Problem Relation Age of Onset  Hypertension Mother    Arthritis Father    Hypertension Father    Depression Brother    Diabetes Maternal Aunt    Prostate cancer Paternal Uncle    Stomach cancer Paternal Grandmother    Depression Daughter    Bipolar disorder Daughter    Allergies Son    Colon cancer Neg Hx    Esophageal cancer Neg Hx    Rectal cancer Neg Hx     Social History Social History   Tobacco Use   Smoking status: Former    Types: Cigars    Quit date: 1998    Years since quitting: 27.7    Passive exposure: Past   Smokeless tobacco: Never  Vaping Use   Vaping status: Never Used  Substance Use Topics   Alcohol use: Yes    Comment: rarely   Drug use: Not Currently    Types: Marijuana     Allergies   Patient has no known allergies.   Review of Systems Review of Systems  Constitutional:  Positive for chills, fatigue and fever.  HENT:  Negative for congestion, ear pain, nosebleeds, postnasal drip, rhinorrhea, sinus pressure, sinus pain and sore throat.   Eyes:  Negative for pain and redness.  Respiratory:  Positive for shortness of breath. Negative for cough and wheezing.   Cardiovascular:  Negative for chest pain, palpitations and leg  swelling.  Gastrointestinal:  Positive for nausea. Negative for abdominal pain, diarrhea and vomiting.  Musculoskeletal:  Positive for myalgias. Negative for arthralgias.  Skin:  Negative for rash.  Neurological:  Negative for dizziness, weakness, light-headedness and headaches.  Hematological:  Negative for adenopathy. Does not bruise/bleed easily.  Psychiatric/Behavioral:  Negative for confusion and sleep disturbance.      Physical Exam Triage Vital Signs ED Triage Vitals  Encounter Vitals Group     BP 09/02/24 1325 137/76     Girls Systolic BP Percentile --      Girls Diastolic BP Percentile --      Boys Systolic BP Percentile --      Boys Diastolic BP Percentile --      Pulse Rate 09/02/24 1318 88     Resp 09/02/24 1318 (!) 30     Temp 09/02/24 1325 98.2 F (36.8 C)     Temp Source 09/02/24 1325 Oral     SpO2 09/02/24 1318 99 %     Weight --      Height --      Head Circumference --      Peak Flow --      Pain Score 09/02/24 1321 10     Pain Loc --      Pain Education --      Exclude from Growth Chart --    No data found.  Updated Vital Signs BP 137/76 (BP Location: Left Arm)   Pulse 81   Temp 98.2 F (36.8 C) (Oral)   Resp (!) 24   LMP 09/25/2020 (Approximate)   SpO2 99%   Visual Acuity Right Eye Distance:   Left Eye Distance:   Bilateral Distance:    Right Eye Near:   Left Eye Near:    Bilateral Near:     Physical Exam Vitals and nursing note reviewed.  Constitutional:      General: She is not in acute distress.    Appearance: Normal appearance. She is not ill-appearing.  HENT:     Head: Normocephalic and atraumatic.     Right Ear: Tympanic membrane and ear canal  normal.     Left Ear: Tympanic membrane and ear canal normal.     Nose: No congestion or rhinorrhea.     Mouth/Throat:     Pharynx: No oropharyngeal exudate or posterior oropharyngeal erythema.  Eyes:     General: No scleral icterus.    Extraocular Movements: Extraocular movements  intact.     Conjunctiva/sclera: Conjunctivae normal.  Cardiovascular:     Rate and Rhythm: Normal rate and regular rhythm.     Heart sounds: No murmur heard. Pulmonary:     Effort: Pulmonary effort is normal. Tachypnea present. No respiratory distress.     Breath sounds: Normal breath sounds. No decreased air movement. No decreased breath sounds, wheezing or rales.     Comments: Speaking in complete sentences Slightly increase WOB Musculoskeletal:     Cervical back: Normal range of motion. No rigidity.  Skin:    Capillary Refill: Capillary refill takes less than 2 seconds.     Coloration: Skin is not jaundiced.     Findings: No rash.  Neurological:     General: No focal deficit present.     Mental Status: She is alert and oriented to person, place, and time.     Motor: No weakness.     Gait: Gait normal.  Psychiatric:        Mood and Affect: Mood normal.        Behavior: Behavior normal.      UC Treatments / Results  Labs (all labs ordered are listed, but only abnormal results are displayed) Labs Reviewed - No data to display  EKG   Radiology DG Chest 2 View Result Date: 09/02/2024 CLINICAL DATA:  Dyspnea today. EXAM: CHEST - 2 VIEW COMPARISON:  03/08/2020 FINDINGS: Lungs are adequately inflated and otherwise clear. Cardiomediastinal silhouette and remainder of the exam is unchanged. IMPRESSION: No active cardiopulmonary disease. Electronically Signed   By: Toribio Agreste M.D.   On: 09/02/2024 14:30    Procedures Procedures (including critical care time)  Medications Ordered in UC Medications - No data to display  Initial Impression / Assessment and Plan / UC Course  I have reviewed the triage vital signs and the nursing notes.  Pertinent labs & imaging results that were available during my care of the patient were reviewed by me and considered in my medical decision making (see chart for details).     Initial impression CXR normal, no acute cardiopulmonary  process Sent to ED via EMS, patient reports unable to go to ED under her own power Final Clinical Impressions(s) / UC Diagnoses   Final diagnoses:  Dyspnea, unspecified type     Discharge Instructions      Sent to ED for further evaluation of shortness of breath    ED Prescriptions   None    PDMP not reviewed this encounter.   Juleen Rush, PA-C 09/02/24 1427    Juleen Rush, PA-C 09/02/24 1435

## 2024-09-02 NOTE — Discharge Instructions (Signed)
 Sent to ED for further evaluation of shortness of breath

## 2024-09-02 NOTE — ED Provider Notes (Signed)
 [facility] AT [facility] Provider Note   CSN: [id number] Arrival date & time: 09/02/24  1457     Patient presents with: Shortness of Breath   [name] is a 53 y.o. female.   The history is provided by the patient and medical records. No language interpreter was used.  Shortness of Breath    53 year old female with history of of anxiety, depression, anemia, CAD, hypertension, remote history of blood clot, sent here from urgent care center with concerns of shortness of breath.  Patient report this morning when she woke up she noticed.  Chest pain, sharp pain to her right upper back, felt short of breath when the pain is intense and felt a bit nauseous.  The pain was intense but has since improved.  She went to the urgent care center today for evaluation of the symptoms but was sent to the ER for further care.  She did mention remotely been diagnosed with having blood clot in her lungs as well as having a heart attack, and pneumonia while in she was having her child.  She did not recall being on any blood thinner medication.  She denies any recent surgery, prolonged bedrest, active cancer, or taking oral hormone.  She denies any strenuous activity or heavy lifting.  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  Patient denies alcohol or tobacco use.  Prior to Admission medications   Medication Sig Start Date End Date Taking? Authorizing Provider  acetaminophen  (TYLENOL ) 500 MG tablet Take 1,000 mg by mouth every 6 (six) hours as needed for headache.    [provider]  amLODipine  (NORVASC ) 5 MG tablet Take 1 tablet by mouth once daily 02/22/23   Adella Norris, MD  Aspirin -Salicylamide-Caffeine (BC HEADACHE PO) Take by mouth as needed.    [provider]  cholecalciferol (VITAMIN D3) 25 MCG (1000 UNIT) tablet Take 5,000 Units by mouth daily.    [provider]   cyclobenzaprine  (FLEXERIL ) 10 MG tablet 1/2 to 1 tab every 8 hours as needed for back pain 05/20/23   Adella Norris, MD  Erenumab -aooe (AIMOVIG ) 140 MG/ML SOAJ Inject 140 mg into the skin every 28 (twenty-eight) days. 08/25/24   Skeet Juliene SAUNDERS, DO  fluticasone  (FLONASE ) 50 MCG/ACT nasal spray Use 2 spray(s) in each nostril once daily 03/05/23   Adella Norris, MD  gabapentin  (NEURONTIN ) 300 MG capsule Take 300 mg by mouth 2 (two) times daily.    [provider]  MELATONIN PO Take 1.5 mg by mouth at bedtime.    [provider]  Multiple Vitamin (MULTIVITAMIN WITH MINERALS) TABS Take 1 tablet by mouth daily.    [provider]  nitroGLYCERIN  (NITROSTAT ) 0.4 MG SL tablet Place 0.4 mg under the tongue every 5 (five) minutes as needed.    [provider]  ondansetron  (ZOFRAN ) 4 MG tablet Take 1 tablet (4 mg total) by mouth every 8 (eight) hours as needed. 08/25/24   Skeet Juliene SAUNDERS, DO  rizatriptan  (MAXALT ) 10 MG tablet Take 1 tablet (10 mg total) by mouth as needed for migraine. May repeat in 2 hours if needed.  Maximum 2 tablets in 24 hours 08/25/24   Skeet Juliene SAUNDERS, DO    Allergies: Patient has no known allergies.    Review of Systems  Respiratory:  Positive for shortness of breath.   All other systems reviewed and are negative.   Updated Vital Signs  BP (!) 141/79   Pulse 69   Temp 98.1 F (36.7 C)   Resp 16   LMP 09/25/2020 (Approximate)   SpO2 100%   Physical Exam Vitals and nursing note reviewed.  Constitutional:      General: She is not in acute distress.    Appearance: She is well-developed.  HENT:     Head: Atraumatic.  Eyes:     Conjunctiva/sclera: Conjunctivae normal.  Neck:     Comments: No JVD Cardiovascular:     Rate and Rhythm: Normal rate and regular rhythm.     Pulses: Normal pulses.     Heart sounds: Normal heart sounds.  Pulmonary:     Effort: Pulmonary effort is normal.     Breath sounds: No wheezing or rales.   Abdominal:     Palpations: Abdomen is soft.     Tenderness: There is no abdominal tenderness.  Musculoskeletal:        General: Tenderness (Mild tenderness noted to right upper posterior back on gentle palpation.  No overlying skin changes.) present.     Cervical back: Normal range of motion and neck supple.     Right lower leg: No edema.     Left lower leg: No edema.  Skin:    Findings: No rash.  Neurological:     Mental Status: She is alert. Mental status is at baseline.  Psychiatric:        Mood and Affect: Mood normal.     (all labs ordered are listed, but only abnormal results are displayed) Labs Reviewed  BASIC METABOLIC PANEL WITH GFR - Abnormal; Notable for the following components:      Result Value   Creatinine, Ser 1.01 (*)    All other components within normal limits  CBC WITH DIFFERENTIAL/PLATELET - Abnormal; Notable for the following components:   HCT 46.5 (*)    All other components within normal limits  RESP PANEL BY RT-PCR (RSV, FLU A&B, COVID)  RVPGX2  BRAIN NATRIURETIC PEPTIDE  HEPATIC FUNCTION PANEL  LIPASE, BLOOD  TROPONIN I (HIGH SENSITIVITY)  TROPONIN I (HIGH SENSITIVITY)    EKG: EKG Interpretation Date/Time:  Wednesday September 02 2024 15:11:07 EDT Ventricular Rate:  71 PR Interval:  170 QRS Duration:  84 QT Interval:  384 QTC Calculation: 417 R Axis:   87  Text Interpretation: Normal sinus rhythm Normal ECG No significant change since last tracing Confirmed by Emil Share [phone]) on 09/02/2024 4:42:59 PM  Radiology: US  Abdomen Limited Result Date: 09/02/2024 EXAM: Right Upper Quadrant Abdominal Ultrasound 09/02/2024 08:52:38 PM TECHNIQUE: Real-time ultrasonography of the right upper quadrant of the abdomen was performed. COMPARISON: None available. CLINICAL HISTORY: RUQ pain. FINDINGS: LIVER: The liver demonstrates normal echogenicity. No intrahepatic biliary ductal dilatation. No evidence of mass. BILIARY SYSTEM: No pericholecystic fluid or  wall thickening. No cholelithiasis. Negative sonographic Murphy's sign. Common bile duct is within normal limits measuring 2 mm. OTHER: No right upper quadrant ascites. IMPRESSION: 1. No acute abnormalities. Electronically signed by: Pinkie Pebbles MD 09/02/2024 09:00 PM EDT RP Workstation: [id number]   CT Angio Chest PE W and/or Wo Contrast Result Date: 09/02/2024 CLINICAL DATA:  High probability for PE. EXAM: CT ANGIOGRAPHY CHEST WITH CONTRAST TECHNIQUE: Multidetector CT imaging of the chest was performed using the standard protocol during bolus administration of intravenous contrast. Multiplanar CT image reconstructions and MIPs were obtained to evaluate the vascular anatomy. RADIATION DOSE REDUCTION: This exam was performed according to the departmental dose-optimization program which includes automated exposure control, adjustment  of the mA and/or kV according to patient size and/or use of iterative reconstruction technique. CONTRAST:  75mL OMNIPAQUE  IOHEXOL  350 MG/ML SOLN COMPARISON:  None Available. FINDINGS: Cardiovascular: Satisfactory opacification of the pulmonary arteries to the segmental level. No evidence of pulmonary embolism. Normal heart size. No pericardial effusion. Mediastinum/Nodes: No enlarged mediastinal, hilar, or axillary lymph nodes. Thyroid gland, trachea, and esophagus demonstrate no significant findings. Lungs/Pleura: There is atelectasis in the right lower lobe. The lungs are otherwise clear. There is no pleural effusion or pneumothorax. Upper Abdomen: There is gallbladder sludge or layering gallstones. Musculoskeletal: No chest wall abnormality. No acute or significant osseous findings. Review of the MIP images confirms the above findings. IMPRESSION: 1. No evidence for pulmonary embolism. 2. Right lower lobe atelectasis. 3. Gallbladder sludge or layering gallstones. Electronically Signed   By: Greig Pique M.D.   On: 09/02/2024 19:38   DG Chest 2 View Result Date:  09/02/2024 CLINICAL DATA:  Dyspnea today. EXAM: CHEST - 2 VIEW COMPARISON:  03/08/2020 FINDINGS: Lungs are adequately inflated and otherwise clear. Cardiomediastinal silhouette and remainder of the exam is unchanged. IMPRESSION: No active cardiopulmonary disease. Electronically Signed   By: Toribio Agreste M.D.   On: 09/02/2024 14:30     Procedures   Medications Ordered in the ED  iohexol  (OMNIPAQUE ) 350 MG/ML injection 75 mL (75 mLs Intravenous Contrast Given 09/02/24 1920)  morphine  (PF) 4 MG/ML injection 4 mg (4 mg Intravenous Given 09/02/24 2123)                                    Medical Decision Making Amount and/or Complexity of Data Reviewed Labs: ordered. Radiology: ordered.  Risk Prescription drug management.   BP (!) 141/79   Pulse 69   Temp 98.1 F (36.7 C)   Resp 16   LMP 09/25/2020 (Approximate)   SpO2 100%   46:72 PM  53 year old female with history of of anxiety, depression, anemia, CAD, hypertension, remote history of blood clot, sent here from urgent care center with concerns of shortness of breath.  Patient report this morning when she woke up she noticed.  Chest pain, sharp pain to her right upper back, felt short of breath when the pain is intense and felt a bit nauseous.  The pain was intense but has since improved.  She went to the urgent care center today for evaluation of the symptoms but was sent to the ER for further care.  She did mention remotely been diagnosed with having blood clot in her lungs as well as having a heart attack, and pneumonia while in she was having her child.  She did not recall being on any blood thinner medication.  She denies any recent surgery, prolonged bedrest, active cancer, or taking oral hormone.  She denies any strenuous activity or heavy lifting.  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  Patient denies alcohol or tobacco use.  Exam notable for mild tenderness to right upper  back on gentle palpation without any overlying skin changes.  Lungs otherwise clear, abdomen soft nontender, no peripheral edema noted to bilateral lower extremities. Suspect shortness of breath and back pain is likely muscle spasm  -Labs ordered, independently viewed and interpreted by me.  Labs remarkable for overall reassuring labs -The patient was maintained on a cardiac monitor.  I personally viewed and interpreted the cardiac monitored  which showed an underlying rhythm of: Normal sinus rhythm -Imaging independently viewed and interpreted by me and I agree with radiologist's interpretation.  Result remarkable for chest CT angiogram showed no evidence of PE however patient does have evidence of gallbladder sludge or layering gallstones.  Since patient endorsed pain to her back, symptom may be related to gallbladder etiology.  Will obtain hepatic function panel, lipase, as well as limited abdominal ultrasound for further assessment. -This patient presents to the ED for concern of shortness of breath, this involves an extensive number of treatment options, and is a complaint that carries with it a high risk of complications and morbidity.  The differential diagnosis includes PE, pneumonia, PTX, gallstone, GERD, gastritis, MSK -Co morbidities that complicate the patient evaluation includes anxiety, depression, anemia, CAD -Treatment includes morphine  -Reevaluation of the patient after these medicines showed that the patient improved -PCP office notes or outside notes reviewed -Escalation to admission/observation considered: patients feels much better, is comfortable with discharge, and will follow up with PCP -Prescription medication considered, patient comfortable with NSAIDs and muscle relaxant -Social Determinant of Health considered which includes food insecurity, housing       Final diagnoses:  None    ED Discharge Orders     None          Nivia Colon, PA-C 09/02/24 2251     Emil Share, DO 09/02/24 2252

## 2024-09-02 NOTE — ED Notes (Addendum)
 Spoke with Norleen Favorite, PA about patient .  Provider at bedside.  Hold off on EKG

## 2024-09-02 NOTE — ED Triage Notes (Signed)
 Pt bib carelink from UC with shob onset yesterday. Pain with inspiration in her back as well. VSS. NSR on 12 lead, 98% RA. L upper slightly diminished.

## 2024-09-04 ENCOUNTER — Ambulatory Visit: Payer: Self-pay | Admitting: Neurology

## 2024-09-04 NOTE — Progress Notes (Signed)
 Patient advised of her results.

## 2024-09-08 ENCOUNTER — Ambulatory Visit: Admitting: Dermatology

## 2024-09-08 ENCOUNTER — Ambulatory Visit: Admitting: Neurology

## 2024-09-08 ENCOUNTER — Encounter: Payer: Self-pay | Admitting: Dermatology

## 2024-09-08 VITALS — BP 148/97 | HR 75

## 2024-09-08 DIAGNOSIS — L649 Androgenic alopecia, unspecified: Secondary | ICD-10-CM

## 2024-09-08 DIAGNOSIS — L219 Seborrheic dermatitis, unspecified: Secondary | ICD-10-CM | POA: Diagnosis not present

## 2024-09-08 DIAGNOSIS — Z79899 Other long term (current) drug therapy: Secondary | ICD-10-CM

## 2024-09-08 MED ORDER — SAFETY SEAL MISCELLANEOUS MISC: 1.0000 | Freq: Every morning | 9 refills | Status: DC

## 2024-09-08 NOTE — Patient Instructions (Addendum)
 VISIT SUMMARY:  During your visit, we discussed your concerns about hair thinning, particularly at the temples and widening of the part. We also reviewed your history of dandruff and itching, as well as your recent medical history, including brain surgery, a collapsed lung, and ongoing back and knee issues. We have developed a treatment plan to address your hair thinning and scalp condition.  YOUR PLAN:  -ANDROGENETIC ALOPECIA: Androgenetic alopecia is a common form of hair loss that occurs due to genetics and aging, leading to thinning hair and miniaturization of hair follicles.  To manage this, you have been prescribed a compounded topical solution of minoxidil and finasteride from [facility].  Apply the medication in the morning to avoid unwanted facial hair growth. Consistent application is necessary for the treatment to be effective. Information on supplements, though not necessary, will be provided in your after-visit summary.  -SEBORRHEIC DERMATITIS OF THE SCALP: Seborrheic dermatitis is a condition that causes dandruff and itching due to inflammation of the scalp.  To manage this, use DHS Zinc  shampoo with 2% zinc , which is stronger than over-the-counter options. Let the shampoo sit for 2-3 minutes before rinsing.  Follow up with a hydrating shampoo and conditioner. Avoid oiling your scalp and shampoo every week to every two weeks.  INSTRUCTIONS:  Please follow up with your doctor next week for further evaluation of your recent lung collapse and any other ongoing health concerns.    Important Information  Due to recent changes in healthcare laws, you may see results of your pathology and/or laboratory studies on MyChart before the doctors have had a chance to review them. We understand that in some cases there may be results that are confusing or concerning to you. Please understand that not all results are received at the same time and often the doctors may need to  interpret multiple results in order to provide you with the best plan of care or course of treatment. Therefore, we ask that you please give us  2 business days to thoroughly review all your results before contacting the office for clarification. Should we see a critical lab result, you will be contacted sooner.   If You Need Anything After Your Visit  If you have any questions or concerns for your doctor, please call our main line at [phone] If no one answers, please leave a voicemail as directed and we will return your call as soon as possible. Messages left after 4 pm will be answered the following business day.   You may also send us  a message via MyChart. We typically respond to MyChart messages within 1-2 business days.  For prescription refills, please ask your pharmacy to contact our office. Our fax number is [phone].  If you have an urgent issue when the clinic is closed that cannot wait until the next business day, you can page your doctor at the number below.    Please note that while we do our best to be available for urgent issues outside of office hours, we are not available 24/7.   If you have an urgent issue and are unable to reach us , you may choose to seek medical care at your doctor's office, retail clinic, urgent care center, or emergency room.  If you have a medical emergency, please immediately call 911 or go to the emergency department. In the event of inclement weather, please call our main line at [phone] for an update on the status of any delays or closures.  Dermatology Medication  Tips: Please keep the boxes that topical medications come in in order to help keep track of the instructions about where and how to use these. Pharmacies typically print the medication instructions only on the boxes and not directly on the medication tubes.   If your medication is too expensive, please contact our office at [phone] or send us  a message through MyChart.    We are unable to tell what your co-pay for medications will be in advance as this is different depending on your insurance coverage. However, we may be able to find a substitute medication at lower cost or fill out paperwork to get insurance to cover a needed medication.   If a prior authorization is required to get your medication covered by your insurance company, please allow us  1-2 business days to complete this process.  Drug prices often vary depending on where the prescription is filled and some pharmacies may offer cheaper prices.  The website www.goodrx.com contains coupons for medications through different pharmacies. The prices here do not account for what the cost may be with help from insurance (it may be cheaper with your insurance), but the website can give you the price if you did not use any insurance.  - You can print the associated coupon and take it with your prescription to the pharmacy.  - You may also stop by our office during regular business hours and pick up a GoodRx coupon card.  - If you need your prescription sent electronically to a different pharmacy, notify our office through [facility] or by phone at [phone]

## 2024-09-08 NOTE — Progress Notes (Signed)
 New Patient Visit   Subjective  [name] is a 53 y.o. female who presents for the following: hair loss  Patient states she has hair loss that she would like to have examined. Patient reports the areas have been there for 2 years. She reports the areas are bothersome. Patient reports that areas are itchy from time to time Patient rates irritation 7 out of 10. Patient reports that it is mostly on the frontal hair line that she loses hair but sometimes it occurs in the occipital region. She states that the areas have spread.Patient reports that hair comes out sporadically in clumps. Hair loss comes and goes. Patient reports she has not previously been treated for these areas. Patient reports taking Vitamins D, E and A to help. Patient denies Hx of bx. Patient denies family history of skin cancer(s). Patient repots no family history of hair thinning. Patient reports that she has not used hair a hair relaxer in years. Patient reports that she does not usually use hair styles that tightly pull on her hair. Patient reports that sometimes she can correlate having an illness with when the hair loss occurs. Patient reports she has had recent brain surgery for a benign tumor.  The following portions of the chart were reviewed this encounter and updated as appropriate: medications, allergies, medical history  Review of Systems:  No other skin or systemic complaints except as noted in HPI or Assessment and Plan.  Patient has questions regarding [facility] derm  Objective  Well appearing patient in no apparent distress; mood and affect are within normal limits.  A focused examination was performed of the following areas: Scalp  Relevant exam findings are noted in the Assessment and Plan.               Assessment & Plan   ANDROGENETIC ALOPECIA (FEMALE PATTERN HAIR LOSS) Exam: Diffuse thinning of the crown and widening of the midline part with retention of the frontal hairline  Not at  goal  Female Androgenic Alopecia is a chronic condition related to genetics and/or hormonal changes.  In women androgenetic alopecia is commonly associated with menopause but may occur any time after puberty.  It causes hair thinning primarily on the crown with widening of the part and temporal hairline recession.  Can use OTC Rogaine (minoxidil) 5% solution/foam as directed.   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 Genetics and aging are contributing factors. Treatment can halt progression, but medications only work while in use, requiring consistent application.  - Prescribe compounded topical minoxidil and finasteride from [facility]. - Instruct to apply medication in the morning to avoid unwanted facial hair growth. - Provide information on supplements, though not necessary, with recommendations included in the after-visit summary.  Seborrheic dermatitis  Seborrheic dermatitis causing dandruff and itching. Regular shampooing and avoiding oiling the scalp are important as oiling exacerbates the condition. - Recommend DHS Zinc  shampoo with 2% zinc , stronger than over-the-counter options. - Instruct to let shampoo sit for 2-3 minutes before rinsing. - Advise to use hydrating shampoo and conditioner after treatment shampoo. - Instruct to avoid oiling the scalp and to shampoo every week to every two weeks.  Long term medication management.  Patient is using long term (months to years) prescription medication  to control their dermatologic condition.  These medications require periodic monitoring to evaluate for efficacy and  side effects and may require periodic laboratory monitoring.    Recommended washing with DHS Zinc  shampoo ANDROGENETIC ALOPECIA   Related Medications Safety Seal Miscellaneous MISC Apply 1 Application  topically in the morning. Medication name Hormonic Hair Solution (minoxidil 7%, finasteride 0.05%)  No follow-ups on file. LILLETTE Lyle Cords, am acting as Neurosurgeon for [employer], DO.   Documentation: I have reviewed the above documentation for accuracy and completeness, and I agree with the above.  Delon Lenis, DO

## 2024-09-12 NOTE — Progress Notes (Unsigned)
 [facility]:    Date:  09/14/2024   ID:  [name], DOB [birth year], MRN [id number]  PCP:  Pcp, No  Cardiologist: New -  Madonna Large, DO {(DOD today)  Referring MD: Theo Eleanor PARAS, PA-C   Chief Complaint: chest pain  History of Present Illness:    [name] is a 53 y.o. 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   Patient was seen in the ED on 09/02/2024 for further evaluation of shortness of breath and chest pain.  Workup was negative.  EKG showed normal sinus rhythm with no acute ischemic changes.  High-sensitivity troponin negative x2.  BNP negative.  Other labs were unremarkable.  Chest CTA was negative for PE.  Abdominal ultrasound showed no acute findings.  She was treated with morphine  and symptoms resolved.  She was felt to be stable for discharge.  Patient presents today for follow-up.  She states she has a history of an MI at the time of her amniotic fluid embolism.  Cardiac catheterization at that time showed normal coronaries so this sounds consistent with demand ischemia rather than ACS.  She was previously is followed by Dr. 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  She has not seen Dr. Levern in at least 2 years.  Unable to see the records.  We discussed her recent ED visit.  She states she woke up that morning and just could not breathe.  She also reported some pain in her chest every time she took a deep breath.  Her PCP told her that ED workup revealed a collapsed lung.  I do not see any mention of a pneumothorax but I do see mention of some right lower lobe  atelectasis on chest CTA.  Since the ED visit, she continues to feel very tired and continues to have atypical chest pain.  She states this is mostly when laying down but also has some pleuritic chest pain when she takes a deep breath.  She initially reported she had some chest pain when walking around as well but upon further questioning it still sounds like she is referring to pleuritic chest pain that occurs with inspiration.  Pain is sharp in nature and only lasts for a couple seconds and then goes away.  This is not sounds like cardiac chest pain.  She reports some shortness of breath.  She mostly describes it as a difficulty of taking a deep breath but also reports some dyspnea on exertion since earlier this summer.  She also describes some orthopnea.  She states she previously slept on 1-2 pillows and is now sleeping on 3-4 since recent ED visit.  No PND or edema.  She reports some palpitations/tachycardia that occur intermittently with rates in the 110-120s on her smart watch (it does not have the ability to check an EKG).  She describes little lightheadedness when walking but no severe dizziness.  No syncope.  She has states she has a history of TIA in 2023 before she was found to have a benign meningioma that same year. She ultimately underwent craniotomy and resection of this in 08/2022. She reports chronic headaches since  then. She is followed by Dr. Skeet for this who recently ordered a head CTA in 08/2024 which showed no evidence of significant stenosis, aneurysmal dilatation, or dissection involving the arteries of the head.  EKGs/Labs/Other Studies Reviewed:    The following studies were reviewed:  Carotid Dopplers 06/14/2022: Impression: Unremarkable carotid Doppler ultrasound.    EKG:  EKG ordered today.  EKG personally reviewed and shows normal sinus rhythm, rate 83 bpm, with no acute ischemic changes.  Recent Labs: 10/01/2023: TSH 3.340 09/02/2024: ALT 16; B Natriuretic Peptide 9.2;  BUN 12; Creatinine, Ser 1.01; Hemoglobin 14.9; Platelets 315; Potassium 3.5; Sodium 141  Recent Lipid Panel    Component Value Date/Time   CHOL 217 (H) 06/11/2022 1116   TRIG 153 (H) 06/11/2022 1116   HDL 57 06/11/2022 1116   LDLCALC 133 (H) 06/11/2022 1116   LDLDIRECT 69 03/28/2010 2118    Physical Exam:    Vital Signs: BP 118/88   Pulse 88   Ht 5' 2 (1.575 m)   Wt 158 lb 6.4 oz (71.8 kg)   LMP 09/25/2020 (Approximate)   SpO2 99%   BMI 28.97 kg/m     Wt Readings from Last 3 Encounters:  09/14/24 158 lb 6.4 oz (71.8 kg)  08/25/24 159 lb (72.1 kg)  01/23/24 168 lb 8 oz (76.4 kg)     General: 53 y.o. female in no acute distress. HEENT: Normocephalic and atraumatic. Sclera clear.  Neck: Supple. No carotid bruits. No JVD. Heart: RRR. Distinct S1 and S2. No murmurs, gallops, or rubs.  Lungs: No increased work of breathing. Clear to ausculation bilaterally. No wheezes, rhonchi, or rales.  Extremities: No lower extremity edema.  Skin: Warm and dry. Neuro: No focal deficits. Psych: Normal affect. Responds appropriately.  Assessment:    1. Chest pain of uncertain etiology   2. Dyspnea, unspecified type   3. Palpitations   4. Tachycardia   5. Generalized weakness   6. Primary hypertension     Plan:    Chest Pain  Patient was recently seen in the ED in 08/2024 for shortness of breath and chest pain. Work-up was negative.  - She continues to have very atypical chest pain that she describes as a very brief sharp pain when she lays down and when she takes a deep breath.  Pain improves when she sits up. - EKG today shows no acute ischemic changes. - Pain sounds atypical.  Chest CTA during recent ED visit was negative for PE.  Will check CRP and Sed Rate to screen for pericarditis given pain is worse on laying down and pleuritic in nature.  Will also get echo to look for pericardial effusion.  Will hold off on ischemic evaluation for now.  Dyspnea  Patient reports dyspnea.   She mostly describes it as a difficulty taking a deep breath but also reports some dyspnea on exertion since earlier this summer. She also describes some orthopnea since recent ED visit. BNP at recent ED visit was normal and there is no signs of edema on chest x-ray or CTA. - Euvolemic on exam. - Will order Echo.  Palpitations Tachycardia Generalized Weakness Patient reports intermittent palpitations like tachycardia with heart rates in the 110s-120s on smart watch.  She also describes intermittent weakness. - EKG today shows normal sinus rhythm with rates in the 80s. - Will check TSH. - Will order a 2 week Zio monitor to rule out any intermittent tachyarrhythmias.   Hypertension BP reasonably well controlled. Diastolic BP slightly elevated  at 44.  - She was previously on Amlodipine  but after visit realized she is no longer on this. Unclear why. She is not currently on any antihypertensives. When we call back with lab results tomorrow, will clarify this and ask her to keep a log of BP/ HR.   Disposition: Follow up in 2 months.   Signed, Aline FORBES Door, PA-C  09/14/2024 12:10 PM    [facility]

## 2024-09-14 ENCOUNTER — Ambulatory Visit: Attending: Student | Admitting: Student

## 2024-09-14 ENCOUNTER — Ambulatory Visit: Attending: Student

## 2024-09-14 ENCOUNTER — Encounter: Payer: Self-pay | Admitting: Student

## 2024-09-14 ENCOUNTER — Other Ambulatory Visit: Payer: Self-pay | Admitting: Student

## 2024-09-14 VITALS — BP 118/88 | HR 88 | Ht 62.0 in | Wt 158.4 lb

## 2024-09-14 DIAGNOSIS — R002 Palpitations: Secondary | ICD-10-CM

## 2024-09-14 DIAGNOSIS — R0600 Dyspnea, unspecified: Secondary | ICD-10-CM | POA: Insufficient documentation

## 2024-09-14 DIAGNOSIS — R531 Weakness: Secondary | ICD-10-CM | POA: Diagnosis present

## 2024-09-14 DIAGNOSIS — R000 Tachycardia, unspecified: Secondary | ICD-10-CM | POA: Diagnosis present

## 2024-09-14 DIAGNOSIS — R079 Chest pain, unspecified: Secondary | ICD-10-CM | POA: Diagnosis present

## 2024-09-14 DIAGNOSIS — I10 Essential (primary) hypertension: Secondary | ICD-10-CM | POA: Insufficient documentation

## 2024-09-14 NOTE — Progress Notes (Unsigned)
 Enrolled for [employer] to mail a ZIO XT long term holter monitor to the patients address on file.   Dr. Odis Hollingshead to read.

## 2024-09-14 NOTE — Patient Instructions (Signed)
 Medication Instructions:  Your physician recommends that you continue on your current medications as directed. Please refer to the Current Medication list given to you today.  *If you need a refill on your cardiac medications before your next appointment, please call your pharmacy*  Lab Work: TODAY:  SED RATE, CRP, & TSH  If you have labs (blood work) drawn today and your tests are completely normal, you will receive your results only by: MyChart Message (if you have MyChart) OR A paper copy in the mail If you have any lab test that is abnormal or we need to change your treatment, we will call you to review the results.  Testing/Procedures: Your physician has requested that you have an echocardiogram. Echocardiography is a painless test that uses sound waves to create images of your heart. It provides your doctor with information about the size and shape of your heart and how well your heart's chambers and valves are working. This procedure takes approximately one hour. There are no restrictions for this procedure. Please do NOT wear cologne, perfume, aftershave, or lotions (deodorant is allowed). Please arrive 15 minutes prior to your appointment time.  Please note: We ask at that you not bring children with you during ultrasound (echo/ vascular) testing. Due to room size and safety concerns, children are not allowed in the ultrasound rooms during exams. Our front office staff cannot provide observation of children in our lobby area while testing is being conducted. An adult accompanying a patient to their appointment will only be allowed in the ultrasound room at the discretion of the ultrasound technician under special circumstances. We apologize for any inconvenience.   ZIO XT- Long Term Monitor Instructions  Your physician has requested you wear a ZIO patch monitor for 14 days.  This is a single patch monitor. [employer] supplies one patch monitor per enrollment. Additional stickers are  not available. Please do not apply patch if you will be having a Nuclear Stress Test,  Echocardiogram, Cardiac CT, MRI, or Chest Xray during the period you would be wearing the  monitor. The patch cannot be worn during these tests. You cannot remove and re-apply the  ZIO XT patch monitor.  Your ZIO patch monitor will be mailed 3 day USPS to your address on file. It may take 3-5 days  to receive your monitor after you have been enrolled.  Once you have received your monitor, please review the enclosed instructions. Your monitor  has already been registered assigning a specific monitor serial # to you.  Billing and Patient Assistance Program Information  We have supplied [employer] with any of your insurance information on file for billing purposes. [employer] offers a sliding scale Patient Assistance Program for patients that do not have  insurance, or whose insurance does not completely cover the cost of the ZIO monitor.  You must apply for the Patient Assistance Program to qualify for this discounted rate.  To apply, please call [employer] at [phone], select option 4, select option 2, ask to apply for  Patient Assistance Program. Meredeth will ask your household income, and how many people  are in your household. They will quote your out-of-pocket cost based on that information.  [employer] will also be able to set up a 45-month, interest-free payment plan if needed.  Applying the monitor   Shave hair from upper left chest.  Hold abrader disc by orange tab. Rub abrader in 40 strokes over the upper left chest as  indicated in your monitor instructions.  Clean  area with 4 enclosed alcohol pads. Let dry.  Apply patch as indicated in monitor instructions. Patch will be placed under collarbone on left  side of chest with arrow pointing upward.  Rub patch adhesive wings for 2 minutes. Remove white label marked 1. Remove the white  label marked 2. Rub patch adhesive wings for 2 additional minutes.   While looking in a mirror, press and release button in center of patch. A small green light will  flash 3-4 times. This will be your only indicator that the monitor has been turned on.  Do not shower for the first 24 hours. You may shower after the first 24 hours.  Press the button if you feel a symptom. You will hear a small click. Record Date, Time and  Symptom in the Patient Logbook.  When you are ready to remove the patch, follow instructions on the last 2 pages of Patient  Logbook. Stick patch monitor onto the last page of Patient Logbook.  Place Patient Logbook in the blue and white box. Use locking tab on box and tape box closed  securely. The blue and white box has prepaid postage on it. Please place it in the mailbox as  soon as possible. Your physician should have your test results approximately 7 days after the  monitor has been mailed back to [facility].  Call [facility] Customer Care at [phone] if you have questions regarding  your ZIO XT patch monitor. Call them immediately if you see an orange light blinking on your  monitor.  If your monitor falls off in less than 4 days, contact our Monitor department at [phone].  If your monitor becomes loose or falls off after 4 days call [employer] at [phone] for  suggestions on securing your monitor   Follow-Up: At [facility], you and your health needs are our priority.  As part of our continuing mission to provide you with exceptional heart care, our providers are all part of one team.  This team includes your primary Cardiologist (physician) and Advanced Practice Providers or APPs (Physician Assistants and Nurse Practitioners) who all work together to provide you with the care you need, when you need it.  Your next appointment:   2 month(s) (CAN USE A HEART FIRST HELD SLOT)   Provider:   Madonna Large, DO    We recommend signing up for the patient portal called MyChart.  Sign up information is  provided on this After Visit Summary.  MyChart is used to connect with patients for Virtual Visits (Telemedicine).  Patients are able to view lab/test results, encounter notes, upcoming appointments, etc.  Non-urgent messages can be sent to your provider as well.   To learn more about what you can do with MyChart, go to [email or web address].   Other Instructions

## 2024-09-15 LAB — SEDIMENTATION RATE: Sed Rate: 2 mm/h (ref 0–40)

## 2024-09-15 LAB — C-REACTIVE PROTEIN: CRP: <1 mg/L (ref 0–10)

## 2024-09-15 LAB — TSH: TSH: 4.51 u[IU]/mL — ABNORMAL HIGH (ref 0.450–4.500)

## 2024-09-19 ENCOUNTER — Ambulatory Visit: Payer: Self-pay | Admitting: Student

## 2024-09-23 NOTE — Progress Notes (Signed)
 New Patient Pulmonology Office Visit   Subjective:  Patient ID: [name], female    DOB: [birth year]  MRN: [id number]  Referred by: Theo Eleanor PARAS, PA-C  CC: No chief complaint on file.   HPI [name] is a 53 y.o. 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  Patient reported sharpig, stabbing pain and sometimes goes to the mid section of back when she tries to lay flat on the bed. It seems more like a pleuritic pain. She describes orthopnea, she feels tightness of her chest, that improves when she takes a deep breath.  She also reports some wheezing throughout the day and also at night associated with some dry cough and no sputum production.  She has shortness of breath with minimal activities like walking around the house, buying groceries which has been present since the beginning of this year.  Reports these episodes of shortness of breath worsen during the winter.  Has albuterol at home but she does not use it due to headaches.  She went to the ER recently for the symptoms, with neg CTAngio for PE and some mild atelectasis on the parenchyma.  She has been evaluated by cardiology, with normal EKG, normal cardiac troponins, BNP , echocardiogram/zio patch are pending.  CRP ESR was negative for pericarditis.  They have discussed that they can treat empirically for pericarditis with colchicine and high-dose of NSAIDs.    She denied any history of pulmonary condition except for PE in 2004. She denied any PND, lower extremity edema.  Patient denied smoking, vaping, sniffing.  She reports some chills and shivering over the last 6 months however her workup has been unrevealing.  Denies any history of childhood asthma.   ROS as above  Allergies: Patient has no known  allergies.  Current Outpatient Medications:    acetaminophen  (TYLENOL ) 500 MG tablet, Take 1,000 mg by mouth every 6 (six) hours as needed for headache., Disp: , Rfl:    Albuterol Sulfate, sensor, 108 (90 Base) MCG/ACT AEPB, Inhale 2 puffs into the lungs every 4 (four) hours as needed (SOB)., Disp: , Rfl:    cholecalciferol (VITAMIN D3) 25 MCG (1000 UNIT) tablet, Take 5,000 Units by mouth daily., Disp: , Rfl:    cyclobenzaprine  (FLEXERIL ) 10 MG tablet, Take 1 tablet (10 mg total) by mouth 2 (two) times daily as needed for muscle spasms., Disp: 20 tablet, Rfl: 0   Erenumab -aooe (AIMOVIG ) 140 MG/ML SOAJ, Inject 140 mg into the skin every 28 (twenty-eight) days., Disp: 1.12 mL, Rfl: 5   fluticasone  (FLONASE ) 50 MCG/ACT nasal spray, Use 2 spray(s) in each nostril once daily, Disp: 16 g, Rfl: 10   gabapentin  (NEURONTIN ) 300 MG capsule, Take 300 mg by mouth 2 (two) times daily., Disp: , Rfl:    ibuprofen  (ADVIL ) 600 MG tablet, Take 1 tablet (600 mg total) by mouth every 6 (six) hours as needed., Disp: 30 tablet, Rfl: 0   MELATONIN PO, Take 1.5 mg by mouth at bedtime., Disp: , Rfl:    Multiple Vitamin (MULTIVITAMIN WITH MINERALS) TABS, Take 1 tablet by mouth daily., Disp: , Rfl:    nitroGLYCERIN  (NITROSTAT ) 0.4 MG SL tablet, Place 0.4 mg under the tongue every 5 (five) minutes as needed., Disp: , Rfl:    ondansetron  (ZOFRAN ) 4 MG tablet, Take 1  tablet (4 mg total) by mouth every 8 (eight) hours as needed., Disp: 20 tablet, Rfl: 5   rizatriptan  (MAXALT ) 10 MG tablet, Take 1 tablet (10 mg total) by mouth as needed for migraine. May repeat in 2 hours if needed.  Maximum 2 tablets in 24 hours, Disp: 10 tablet, Rfl: 5   Safety Seal Miscellaneous MISC, Apply 1 Application topically in the morning. 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   DIC in setting SVD with amniotic fluid embolism   History of hypertension 2004   PIH   History of hypothyroidism    per pt yrs ago -- resolved   History of MI (myocardial infarction) 02-05-2003----  resolved   in setting PIH during pregnancy--- SVD with amniotic fluid embolism, DIC, respiratory failure with pulmonary edema (pt was venterd)   Hypertension    Irritable bowel syndrome    Past Surgical History:  Procedure Laterality Date   ANTERIOR CRUCIATE LIGAMENT (ACL) REVISION Left 07/24/2018   Procedure: Left knee arthroscopy, debridement, allograft revision anterior cruciate ligament reconstruction,  hardware removal deep, partialmedial and lateral  menisectomy;  Surgeon: Gerome Charleston, MD;  Location: [facility];  Service: Orthopedics;  Laterality: Left;  2 hrs General with adductor canal block   ANTERIOR CRUCIATE LIGAMENT REPAIR Left 04-20-2003    dr sheril   [facility]   APPLICATION OF CRANIAL NAVIGATION N/A 09/06/2022   Procedure: APPLICATION OF CRANIAL NAVIGATION;  Surgeon: Colon Shove, MD;  Location: [facility] OR;  Service: Neurosurgery;  Laterality: N/A;  RM 21   CARDIAC CATHETERIZATION  07-01-2003   dr levern    [facility]   positive stress cardiolite:  normal coronaries,  LV w/ mild global hypokinesis, ef 50%   CRANIOTOMY Right 09/06/2022   Procedure: Right Frontal craniotomy for meningioma with stealth;  Surgeon: Colon Shove, MD;  Location: [facility];  Service: Neurosurgery;  Laterality: Right;   CURRETTAGE FOR RETAINED PRODUCTS OF CONCEPTION  11-25-2007      @[facility]    FOR PORTPARTUM HEMORRHAGE   EXCISION MASS HEAD Right 04/01/2023   Procedure: EXCISION OF RIGHT PERI-MANDIBULAR MASS;  Surgeon: Jesus Oliphant, MD;  Location: [facility];  Service:  ENT;  Laterality: Right;   HYSTEROSCOPY WITH NOVASURE N/A 11/27/2013   Procedure: HYSTEROSCOPY WITH NOVASURE;  Surgeon: Elveria Mungo, MD;  Location: [facility];  Service: Gynecology;  Laterality: N/A;   Left ACL reconstruction Left 08/2018   MCL sprain as well.   POST PARTUM TUBAL LIGATION WITH FILSHIE CLIPS  11-17-2002     dr leggett  [facility]   TRANSTHORACIC ECHOCARDIOGRAM  05/09/2010   ef 55-60%/  trivial MR and TR/  trivial pericardial effusion   ULNAR SHORTENING WITH BONE GRAFT Right 03/28/2017   Procedure: RIGHT ULNA WRIST OSTEOTOMY;  Surgeon: Arley Curia, MD;  Location: [facility];  Service: Orthopedics;  Laterality: Right;   WRIST ARTHROSCOPY WITH FOVEAL TRIANGULAR FIBROCARTILAGE COMPLEX REPAIR Right 09/25/2016   Procedure: Right WRIST ARTHROSCOPY WITH FOVEAL TRIANGULAR FIBROCARTILAGE COMPLEX REPAIR;  Surgeon: Arley Curia, MD;  Location: [facility];  Service: Orthopedics;  Laterality: Right;   Family History  Problem Relation Age of Onset   Hypertension Mother    Arthritis Father    Hypertension Father    Depression Brother    Diabetes Maternal Aunt    Prostate cancer Paternal Uncle    Stomach cancer Paternal Grandmother    Depression Daughter    Bipolar disorder Daughter    Allergies Son    Colon cancer Neg Hx    Esophageal cancer Neg Hx    Rectal cancer Neg Hx    Social History   Socioeconomic History   Marital status: Widowed    Spouse name: Not on file   Number of children: 4   Years of education: Not on file   Highest education level: Some college, no degree  Occupational History   Occupation: Unemployed    Comment: Has not worked since husband killed in August 2016.    Tobacco Use   Smoking status: Former    Types: Cigars    Quit date: 1998    Years since quitting: 27.8    Passive exposure: Past   Smokeless tobacco: Never  Vaping Use   Vaping status: Never Used  Substance and Sexual Activity   Alcohol use: Yes    Comment:  rarely   Drug use: Not Currently    Types: Marijuana   Sexual activity: Yes    Birth control/protection: Surgical    Comment: novasure  and PPTL  Other Topics Concern   Not on file  Social History Narrative   Lives with 2 youngest children, son and daughter.   Has not worked since estranged husband killed   Does not work.    Depression she feels is what is keeping her from working.   Social Drivers of Corporate investment banker Strain: High Risk (02/24/2024)   Received from [facility]   Overall Financial Resource Strain (CARDIA)    Difficulty of Paying Living Expenses: Very hard  Food Insecurity: Food Insecurity Present (02/24/2024)   Received from [facility]   Hunger Vital Sign    Within the past 12 months, you worried that your food would run out before you got the money to buy more.: Sometimes true    Within the past 12 months, the food you bought just didn't last and you didn't have money to get more.: Often true  Transportation Needs: No Transportation Needs (02/24/2024)   Received from [facility] - Transportation    Lack of Transportation (Medical): No    Lack of Transportation (Non-Medical): No  Physical Activity: Not on file  Stress: Not on file  Social Connections: Not on file  Intimate Partner Violence: Not At Risk (08/03/2021)   Humiliation, Afraid, Rape, and Kick questionnaire    Fear of Current or Ex-Partner: No    Emotionally Abused: No    Physically Abused: No    Sexually Abused: No    No history of smoking, vaping.  On disability due to back pain.   Objective:  LMP 09/25/2020 (Approximate)  Wt Readings from Last 3 Encounters:  09/24/24 158 lb 12.8 oz (72 kg)  09/14/24 158 lb 6.4 oz (71.8 kg)  08/25/24 159 lb (72.1 kg)   SpO2 Readings from Last 3 Encounters:  09/24/24 100%  09/14/24 99%  09/02/24 100%    Physical Exam Constitutional:      Appearance: Normal appearance.  HENT:     Head: Normocephalic.     Mouth/Throat:      Mouth: Mucous membranes  are moist.  Eyes:     Pupils: Pupils are equal, round, and reactive to light.  Cardiovascular:     Rate and Rhythm: Normal rate and regular rhythm.     Pulses: Normal pulses.     Heart sounds: Normal heart sounds.  Pulmonary:     Effort: Pulmonary effort is normal.     Breath sounds: Normal breath sounds.  Musculoskeletal:        General: Normal range of motion.     Comments: NO LE edema  Skin:    General: Skin is warm.  Neurological:     General: No focal deficit present.     Mental Status: She is alert and oriented to person, place, and time.     Diagnostic Review:  Last CBC Lab Results  Component Value Date   WBC 7.7 09/02/2024   HGB 14.9 09/02/2024   HCT 46.5 (H) 09/02/2024   MCV 94.9 09/02/2024   MCH 30.4 09/02/2024   RDW 14.4 09/02/2024   PLT 315 09/02/2024   Last metabolic panel Lab Results  Component Value Date   GLUCOSE 91 09/02/2024   NA 141 09/02/2024   K 3.5 09/02/2024   CL 106 09/02/2024   CO2 22 09/02/2024   BUN 12 09/02/2024   CREATININE 1.01 (H) 09/02/2024   GFRNONAA >60 09/02/2024   CALCIUM 10.2 09/02/2024   PROT 6.9 09/02/2024   ALBUMIN 4.0 09/02/2024   LABGLOB 2.1 10/01/2023   AGRATIO 2.4 (H) 05/16/2023   BILITOT 0.9 09/02/2024   ALKPHOS 59 09/02/2024   AST 22 09/02/2024   ALT 16 09/02/2024   ANIONGAP 13 09/02/2024      Assessment & Plan:   Assessment & Plan Mild intermittent asthma without complication Shortness of breath 53 years old who had atypical symptoms related to shortness of breath.  She has been evaluated by cardiology with unrevealing workup, echocardiogram is pending.  Given her pleuritic pain, she is treated for possible pericarditis however ESR CRP has been negative.   She reports wheezing for many months throughout the day and at night.  She feels short of breath with minimal activities and all the time.  She went to the ER with a CTA negative for PE and minimal atelectasis that does not explain  her dyspnea.  Her oxygen today was 100%, no wheezing during physical examination. She has multiple symptoms as pleuritic pain, unexplained severe dyspnea, chills/shivering, wheezing, orthopnea that it does not fit an unifying diagnosis.   Plan: I will give her a trial of Symbicort for 3 months to see if there is any improvement of her breathing.   I will order PFTs as well.   I demonstrate inhaler use as well written instructions have been provided. Awaiting for echo results. Flu shot was given today.   Orders:   Pulmonary Function Test; Future  Immunization due  Orders:   Flu vaccine trivalent PF, 6mos and older(Flulaval,Afluria,Fluarix,Fluzone)    Return in about 4 months (around 01/25/2025).    Marny Patch, MD Pulmonary and Critical Care Medicine [facility]

## 2024-09-23 NOTE — Telephone Encounter (Signed)
 Called and spoke with patient. Discussed that we could empirically treat for pericarditis with Colchicine and 1 week of high dose NSAIDs (ibuprofen  800mg  three times daily). She feels like symptoms are getting better so she would like to hold off on this for now. However, she will notify us  if symptoms worsen or if she has any new or worsening symptoms prior to follow-up visit. I did recommend that she complete the zio monitor for further evaluation of palpitations/ tachycardia.

## 2024-09-24 ENCOUNTER — Ambulatory Visit

## 2024-09-24 VITALS — BP 126/86 | HR 80 | Ht 62.0 in | Wt 158.8 lb

## 2024-09-24 DIAGNOSIS — R0602 Shortness of breath: Secondary | ICD-10-CM | POA: Diagnosis not present

## 2024-09-24 DIAGNOSIS — Z23 Encounter for immunization: Secondary | ICD-10-CM | POA: Diagnosis not present

## 2024-09-24 DIAGNOSIS — J4520 Mild intermittent asthma, uncomplicated: Secondary | ICD-10-CM | POA: Diagnosis not present

## 2024-09-24 MED ORDER — BUDESONIDE-FORMOTEROL FUMARATE 80-4.5 MCG/ACT IN AERO: 2.0000 | INHALATION_SPRAY | Freq: Two times a day (BID) | RESPIRATORY_TRACT | 6 refills | Status: AC

## 2024-09-24 MED ORDER — ALBUTEROL SULFATE (SENSOR) 108 (90 BASE) MCG/ACT IN AEPB: 1.0000 | INHALATION_SPRAY | RESPIRATORY_TRACT | 6 refills | Status: AC | PRN

## 2024-09-24 NOTE — Patient Instructions (Addendum)
 Dear Ms. [name],  Give your history of wheezing every day and shortness of breath, I think you may have asthma, I recommend the following;  -Symbicort 2 times a day. Rinse your mouth after every use. -Albuterol 1 puff as need it for severe shortness of breath. -I order Pulmonary function test.  I will see you in 4 months  INHALER INSTRUCTIONS: To use the inhaler you follow these steps: Prime the inhaler according to instructions (which means waste between 1-4 doses before next use).  Follow package insert Shake the inhaler before each use Exhale completely by blowing all the air out of your lungs Seal your mouth around the inhaler Press down on the canister then inhale SLOW and STEADY until your fill your lungs completely. Hold the breath for 6-10 seconds Gently exhale Wait 60 seconds then repeat steps 2-8. Rinse the mouth after each use to prevent thrush  Your pharmacist can also review proper technique if you have any remaining questions. Let me know if the cost is too high, your insurance may be able to recommend a more affordable option for you.

## 2024-10-28 ENCOUNTER — Other Ambulatory Visit (HOSPITAL_COMMUNITY): Payer: Self-pay

## 2024-10-29 ENCOUNTER — Telehealth: Payer: Self-pay

## 2024-10-29 NOTE — Telephone Encounter (Signed)
 Pharmacy Patient Advocate Encounter   Received notification from CoverMyMeds that prior authorization for Aimovig  140MG /ML auto-injectors  is required/requested.   Insurance verification completed.   The patient is insured through [employer].   Per test claim: xxx

## 2024-10-30 NOTE — Telephone Encounter (Signed)
 Mychart message sent to see if patient taking aimovig  and how she is doing on it.

## 2024-11-01 DIAGNOSIS — R000 Tachycardia, unspecified: Secondary | ICD-10-CM

## 2024-11-01 DIAGNOSIS — R0600 Dyspnea, unspecified: Secondary | ICD-10-CM

## 2024-11-01 DIAGNOSIS — R079 Chest pain, unspecified: Secondary | ICD-10-CM | POA: Diagnosis not present

## 2024-11-01 DIAGNOSIS — R002 Palpitations: Secondary | ICD-10-CM

## 2024-11-02 ENCOUNTER — Ambulatory Visit: Payer: Self-pay | Admitting: Student

## 2024-11-16 ENCOUNTER — Ambulatory Visit (HOSPITAL_COMMUNITY)
Admission: RE | Admit: 2024-11-16 | Discharge: 2024-11-16 | Disposition: A | Source: Ambulatory Visit | Attending: Student

## 2024-11-16 DIAGNOSIS — R002 Palpitations: Secondary | ICD-10-CM

## 2024-11-16 DIAGNOSIS — R079 Chest pain, unspecified: Secondary | ICD-10-CM

## 2024-11-16 LAB — ECHOCARDIOGRAM COMPLETE
AR max vel: 1.94 cm2
AV Area VTI: 1.78 cm2
AV Area mean vel: 1.76 cm2
AV Mean grad: 2 mmHg
AV Peak grad: 3.8 mmHg
Ao pk vel: 0.97 m/s
Area-P 1/2: 3.68 cm2
S' Lateral: 2.87 cm

## 2024-11-18 ENCOUNTER — Encounter: Payer: Self-pay | Admitting: Cardiology

## 2024-11-18 ENCOUNTER — Ambulatory Visit: Attending: Cardiology | Admitting: Cardiology

## 2024-11-18 VITALS — BP 130/80 | HR 78 | Ht 62.0 in | Wt 155.0 lb

## 2024-11-18 DIAGNOSIS — R000 Tachycardia, unspecified: Secondary | ICD-10-CM | POA: Diagnosis present

## 2024-11-18 DIAGNOSIS — E782 Mixed hyperlipidemia: Secondary | ICD-10-CM | POA: Insufficient documentation

## 2024-11-18 DIAGNOSIS — R072 Precordial pain: Secondary | ICD-10-CM | POA: Insufficient documentation

## 2024-11-18 DIAGNOSIS — R0602 Shortness of breath: Secondary | ICD-10-CM | POA: Insufficient documentation

## 2024-11-18 NOTE — Progress Notes (Unsigned)
 Cardiology Office Note:  .   ID:  [name], DOB 02-Jul-1971, MRN [id number] PCP:  Theo Eleanor JINNY DEVONNA  Former Cardiology Providers: N/A [facility] Providers Cardiologist:  Madonna Large, DO , [facility] (established care 11/18/24) Electrophysiologist:  None  Click to update primary MD,subspecialty MD or APP then REFRESH:1}    Chief Complaint  Patient presents with   Follow-up    Reevaluate chest pain and discuss test results    History of Present Illness: .   [name] is a 53 y.o. 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  Patient was seen by Aline Door on 09/14/2024 in the [facility] and now referred to me for further evaluation and management.  Based on the last progress note from 09/14/2024 patient was seen in the ED in September 2025 for shortness of breath and chest pain.  EKG at that time was nonischemic, high sensitive troponins were negative x 2, CTA chest was negative for pulmonary embolism, and she was later discharged.  Patient endorsed having history of MI at the time of her amniotic fluid embolism.  Coronary angiography at that time noted normal coronaries.  She was followed by Dr. Levern.  At the last office visit in October 2025 patient was endorsing chest pain and dyspnea.  The chest pain was felt to be noncardiac/atypical, at times pleuritic in nature, pronounced with laying supine, EKG was nonischemic, inflammatory markers were checked which were within normal limits, echocardiogram ordered results reviewed with her at today's visit.  Shared decision was to hold off on ischemic evaluation.  In addition, given her  palpitations and tachycardia thyroid function was checked and the cardiac monitor was ordered.  Since last office visit patient states that her chest pain and shortness of breath have improved significantly likely secondary to starting inhalers.  However symptoms are still present but much less in intensity frequency and duration.  She is walking twice a week without any exertional symptoms. When she does have chest pain she describes it as a sharp sensation, couple minutes duration, anterior chest, not brought on by effort related activities, does not resolve with rest.  Symptoms more noticeable when she lays down.  No sick contacts no prior history of tuberculosis, and no recent travels  No structured exercise program or daily routine.  Her primary care provider works at [employer].  Review of Systems: .   Review of Systems  Cardiovascular:  Positive for chest pain (improved since last visit.). Negative for claudication, irregular heartbeat, leg swelling, near-syncope, orthopnea, palpitations, paroxysmal nocturnal dyspnea and syncope.  Respiratory:  Positive for shortness of breath (improved since last visit.).   Hematologic/Lymphatic: Negative for bleeding problem.    Studies Reviewed:   EKG: 09/14/2024: SR 83bpm  Echocardiogram: 11/16/2024  1. Left ventricular ejection fraction, by estimation, is 55 to 60%. The  left ventricle has normal function. The left ventricle has no regional  wall motion abnormalities. Left ventricular diastolic parameters are  consistent with Grade I diastolic  dysfunction (impaired relaxation).   2. Right ventricular systolic function is mildly reduced. The right  ventricular size is normal. There is normal pulmonary artery systolic  pressure. The estimated right ventricular systolic pressure is 18.2 mmHg.   3. The mitral  valve is normal in structure. Trivial mitral valve  regurgitation. No evidence of mitral stenosis.   4. The aortic valve is normal in  structure. Aortic valve regurgitation is  not visualized. No aortic stenosis is present.   5. The inferior vena cava is normal in size with greater than 50%  respiratory variability, suggesting right atrial pressure of 3 mmHg.   Cardiac monitor: Cardiac monitor (Zio Patch): 09/16/2024 - 09/21/2024 Dominant rhythm sinus.  Tachycardia burden, 24% Heart rate 45-189 bpm. Avg HR 85 bpm. No atrial fibrillation detected during the monitoring period. No ventricular tachycardia, high grade AV block, pauses (3 seconds or longer). 1 asymptomatic episode of supraventricular tachycardia on 09/20/2024, 5:21 AM, 189 bpm. Total supraventricular ectopic burden <1%. Total ventricular ectopic burden <1%. Patient triggered events: 33. Underlying rhythm either sinus or sinus tachycardia without sustained arrhythmia.   RADIOLOGY: N/A  Risk Assessment/Calculations:   NA   Labs:       Latest Ref Rng & Units 09/02/2024    3:05 PM 01/20/2024    6:05 PM 10/01/2023   11:28 AM  CBC  WBC 4.0 - 10.5 K/uL 7.7  7.8  8.6   Hemoglobin 12.0 - 15.0 g/dL 85.0  86.5  85.5   Hematocrit 36.0 - 46.0 % 46.5  41.1  45.5   Platelets 150 - 400 K/uL 315  256  319        Latest Ref Rng & Units 09/02/2024    3:05 PM 01/20/2024    6:05 PM 10/01/2023   11:28 AM  BMP  Glucose 70 - 99 mg/dL 91  880  89   BUN 6 - 20 mg/dL 12  16  12    Creatinine 0.44 - 1.00 mg/dL 8.98  9.20  8.92   BUN/Creat Ratio 9 - 23   11   Sodium 135 - 145 mmol/L 141  142  146   Potassium 3.5 - 5.1 mmol/L 3.5  3.9  4.6   Chloride 98 - 111 mmol/L 106  109  106   CO2 22 - 32 mmol/L 22  24  25    Calcium 8.9 - 10.3 mg/dL 89.7  9.5  89.9       Latest Ref Rng & Units 09/02/2024    6:49 PM 09/02/2024    3:05 PM 01/20/2024    6:05 PM  CMP  Glucose 70 - 99 mg/dL  91  880   BUN 6 - 20 mg/dL  12  16   Creatinine 9.55 - 1.00 mg/dL  8.98  9.20   Sodium 864 - 145 mmol/L  141  142   Potassium 3.5 - 5.1 mmol/L  3.5  3.9   Chloride 98 - 111 mmol/L  106   109   CO2 22 - 32 mmol/L  22  24   Calcium 8.9 - 10.3 mg/dL  89.7  9.5   Total Protein 6.5 - 8.1 g/dL 6.9     Total Bilirubin 0.0 - 1.2 mg/dL 0.9     Alkaline Phos 38 - 126 U/L 59     AST 15 - 41 U/L 22     ALT 0 - 44 U/L 16       Lab Results  Component Value Date   CHOL 217 (H) 06/11/2022   HDL 57 06/11/2022   LDLCALC 133 (H) 06/11/2022   LDLDIRECT 69 03/28/2010   TRIG 153 (H) 06/11/2022   No results for input(s): LIPOA in the last 8760 hours. No components found for: NTPROBNP No results  for input(s): PROBNP in the last 8760 hours. Recent Labs    09/14/24 1245  TSH 4.510*    Physical Exam:    Today's Vitals   11/18/24 1413  BP: 130/80  Pulse: 78  SpO2: 97%  Weight: 155 lb (70.3 kg)  Height: 5' 2 (1.575 m)  PainSc: 0-No pain   Body mass index is 28.35 kg/m. Wt Readings from Last 3 Encounters:  11/18/24 155 lb (70.3 kg)  09/24/24 158 lb 12.8 oz (72 kg)  09/14/24 158 lb 6.4 oz (71.8 kg)    Physical Exam  Constitutional: No distress.  hemodynamically stable  Neck: No JVD present.  Cardiovascular: Normal rate, regular rhythm, S1 normal and S2 normal. Exam reveals no gallop, no S3 and no S4.  No murmur heard. Pulmonary/Chest: Effort normal and breath sounds normal. No stridor. She has no wheezes. She has no rales.  Musculoskeletal:        General: No edema.     Cervical back: Neck supple.  Skin: Skin is warm.     Impression & Recommendation(s):  Impression:   ICD-10-CM   1. Precordial pain  R07.2     2. Shortness of breath  R06.02     3. Tachycardia  R00.0     4. Mixed hyperlipidemia  E78.2        Recommendation(s):  Precordial pain Shortness of breath Improving Echo December 2025: LVEF 55 to 60%, grade 1 diastolic dysfunction, no significant valvular heart disease Patient endorses that symptoms have improved after starting inhalers. ESR and CRP markers are within normal limits No recent travels, sick contacts, and no pericardial  effusion Given the echocardiographic findings of diastolic dysfunction we discussed monitoring for blood pressures at home to see if medications are needed and to also improve her other modifiable cardiovascular risk factors.  Patient tells me that she was on blood pressure medication in the past but discontinued it on her own because numbers were normal while taking the medications.   Patient education provided. For now recommend checking blood pressures at home and to follow-up with PCP to see if further medication titration is warranted.    Tachycardia Cardiac monitor results reviewed. No sustained arrhythmias. Tachycardic at times, patient states that she does have underlying anxiety which could be contributory  Mixed hyperlipidemia July 2023: LDL 133 mg/dL. Recommended having it rechecked with PCP  Orders Placed:  No orders of the defined types were placed in this encounter.    Final Medication List:   No orders of the defined types were placed in this encounter.   Medications Discontinued During This Encounter  Medication Reason   Erenumab -aooe (AIMOVIG ) 140 MG/ML SOAJ Discontinued by provider   ondansetron  (ZOFRAN ) 4 MG tablet Patient Preference   Safety Seal Miscellaneous MISC Discontinued by provider     Current Outpatient Medications:    Albuterol  Sulfate, sensor, 108 (90 Base) MCG/ACT AEPB, Inhale 1 puff into the lungs every 4 (four) hours as needed (SOB)., Disp: 1 each, Rfl: 6   budesonide -formoterol  (SYMBICORT ) 80-4.5 MCG/ACT inhaler, Inhale 2 puffs into the lungs in the morning and at bedtime., Disp: 1 each, Rfl: 6   cholecalciferol (VITAMIN D3) 25 MCG (1000 UNIT) tablet, Take 5,000 Units by mouth daily., Disp: , Rfl:    cyclobenzaprine  (FLEXERIL ) 10 MG tablet, Take 1 tablet (10 mg total) by mouth 2 (two) times daily as needed for muscle spasms., Disp: 20 tablet, Rfl: 0   fluticasone  (FLONASE ) 50 MCG/ACT nasal spray, Use 2 spray(s) in each nostril  once daily, Disp: 16  g, Rfl: 10   gabapentin  (NEURONTIN ) 300 MG capsule, Take 300 mg by mouth 2 (two) times daily., Disp: , Rfl:    Multiple Vitamin (MULTIVITAMIN WITH MINERALS) TABS, Take 1 tablet by mouth daily., Disp: , Rfl:    rizatriptan  (MAXALT ) 10 MG tablet, Take 1 tablet (10 mg total) by mouth as needed for migraine. May repeat in 2 hours if needed.  Maximum 2 tablets in 24 hours, Disp: 10 tablet, Rfl: 5   acetaminophen  (TYLENOL ) 500 MG tablet, Take 1,000 mg by mouth every 6 (six) hours as needed for headache., Disp: , Rfl:    ibuprofen  (ADVIL ) 600 MG tablet, Take 1 tablet (600 mg total) by mouth every 6 (six) hours as needed. (Patient not taking: Reported on 11/18/2024), Disp: 30 tablet, Rfl: 0   MELATONIN PO, Take 1.5 mg by mouth at bedtime., Disp: , Rfl:    nitroGLYCERIN  (NITROSTAT ) 0.4 MG SL tablet, Place 0.4 mg under the tongue every 5 (five) minutes as needed. (Patient not taking: Reported on 11/18/2024), Disp: , Rfl:   Consent:   NA  Disposition:   As needed  Her questions and concerns were addressed to her satisfaction. She voices understanding of the recommendations provided during this encounter.   Medical decision making Discussed management of at least 2 symptoms and the workup thus far. Reviewed results of the echocardiogram 11/16/2024, cardiac monitor October 2025, labs independently reviewed from 09/14/2024. Patient education provided. Emphasized the importance of following up with PCP. Will share records with PCP after today's visit. Patient will follow-up on as-needed basis.  Patient agreeable with the plan of care all questions and concerns were addressed   Signed, Madonna Michele HAS, [facility]  A Division of [facility] [address] [place], [place] [address]

## 2024-11-18 NOTE — Patient Instructions (Signed)
 Medication Instructions:  NO CHANGES  *If you need a refill on your cardiac medications before your next appointment, please call your pharmacy*  Follow-Up: At [facility], you and your health needs are our priority.  As part of our continuing mission to provide you with exceptional heart care, our providers are all part of one team.  This team includes your primary Cardiologist (physician) and Advanced Practice Providers or APPs (Physician Assistants and Nurse Practitioners) who all work together to provide you with the care you need, when you need it.  Your next appointment:   AS NEEDED with Dr. Michele  Please follow up with PCP for BP and cholesterol management   We recommend signing up for the patient portal called MyChart.  Sign up information is provided on this After Visit Summary.  MyChart is used to connect with patients for Virtual Visits (Telemedicine).  Patients are able to view lab/test results, encounter notes, upcoming appointments, etc.  Non-urgent messages can be sent to your provider as well.   To learn more about what you can do with MyChart, go to [email or web address].   Other Instructions

## 2024-11-21 ENCOUNTER — Encounter: Payer: Self-pay | Admitting: Cardiology

## 2024-12-15 ENCOUNTER — Encounter: Payer: Self-pay | Admitting: Neurology

## 2024-12-15 ENCOUNTER — Other Ambulatory Visit: Payer: Self-pay | Admitting: Neurology

## 2024-12-15 MED ORDER — NURTEC 75 MG PO TBDP: 1.0000 | ORAL_TABLET | ORAL | 11 refills | Status: DC

## 2024-12-22 ENCOUNTER — Other Ambulatory Visit (HOSPITAL_COMMUNITY): Payer: Self-pay

## 2024-12-22 ENCOUNTER — Telehealth: Payer: Self-pay | Admitting: Pharmacy Technician

## 2024-12-22 NOTE — Telephone Encounter (Signed)
 Pharmacy Patient Advocate Encounter   Received notification from Patient Advice Request messages that prior authorization for NURTEC 75MG  is required/requested.   Insurance verification completed.   The patient is insured through [employer].   Per test claim: PA required; PA submitted to above mentioned insurance via Latent Key/confirmation #/EOC [facility] Status is pending

## 2024-12-22 NOTE — Telephone Encounter (Signed)
 PA has been submitted, and telephone encounter has been created. Please see telephone encounter dated 1.13.26.  Patient will likely need to try another preferred injectable CGRP before they will cover Nurtec based off of the criteria:

## 2024-12-29 ENCOUNTER — Telehealth: Payer: Self-pay | Admitting: Neurology

## 2024-12-29 NOTE — Telephone Encounter (Signed)
 PA pending, Will check with the Prior Auth.

## 2024-12-29 NOTE — Telephone Encounter (Signed)
 Pt called in this morning. Pt wants to know why the prescription called: Nurtec 75 mg has not been approved . Please call. Thanks

## 2024-12-29 NOTE — Telephone Encounter (Signed)
 Pharmacy Patient Advocate Encounter  Received notification from [employer] that Prior Authorization for NURTEC 75MG  has been DENIED.  See denial reason below. No denial letter attached in CMM. Will attach denial letter to Media tab once received.    PA #/Case ID/Reference #: [id number]

## 2024-12-29 NOTE — Telephone Encounter (Signed)
 PA status has been updated in encounter. Pt will need to try TWO preferred injectable such Aimovig , Ajovy , or Emgality.

## 2024-12-31 MED ORDER — AJOVY 225 MG/1.5ML ~~LOC~~ SOAJ: 225.0000 mg | SUBCUTANEOUS | 5 refills | Status: AC

## 2024-12-31 NOTE — Telephone Encounter (Signed)
 Patient advised per [facility], She will need to try one other monthly injection before her insurance would consider approving Nurtec. I would recommend Ajovy  every 28 days. Out of all of the medications in that class, Aimovig  has the highest probability of constipation, so this doesn't mean she will still get constipation. If she is agreeable, please send in prescription for AJOVY  EVERY 28 DAYS, REFILL 5. We would need to give this medication a 3 month trial before determining if it is effective or not.    Spoke to [facility] to cancel Nurtec.

## 2025-01-01 ENCOUNTER — Telehealth: Payer: Self-pay | Admitting: Neurology

## 2025-01-01 ENCOUNTER — Other Ambulatory Visit: Payer: Self-pay | Admitting: Neurology

## 2025-01-01 MED ORDER — RIZATRIPTAN BENZOATE 10 MG PO TABS: 10.0000 mg | ORAL_TABLET | ORAL | 5 refills | Status: AC | PRN

## 2025-01-01 NOTE — Telephone Encounter (Signed)
 Patient called and wanted to let us  know that she was informed that the ajovy  needs to have a prior auth. She uses the [facility] on [address]   She would like a call to let her know when this is done

## 2025-01-12 ENCOUNTER — Telehealth: Payer: Self-pay | Admitting: Pharmacy Technician

## 2025-01-12 ENCOUNTER — Other Ambulatory Visit (HOSPITAL_COMMUNITY): Payer: Self-pay

## 2025-01-12 NOTE — Telephone Encounter (Signed)
 Pharmacy Patient Advocate Encounter  Received notification from [employer] that Prior Authorization for AJOVY  225MG  has been APPROVED from 2.3.26 to 5.4.26. Ran test claim, Copay is $4. This test claim was processed through [facility]- copay amounts may vary at other pharmacies due to pharmacy/plan contracts, or as the patient moves through the different stages of their insurance plan.   PA #/Case ID/Reference #: [id number]

## 2025-01-12 NOTE — Telephone Encounter (Signed)
 PA has been submitted, and telephone encounter has been created. Please see telephone encounter dated 2.3.26.

## 2025-01-12 NOTE — Telephone Encounter (Signed)
 Pharmacy Patient Advocate Encounter   Received notification from Pt Calls Messages that prior authorization for AJOVY  225MG  is required/requested.   Insurance verification completed.   The patient is insured through [employer].   Per test claim: PA required; PA submitted to above mentioned insurance via Latent Key/confirmation #/EOC [facility] Status is pending

## 2025-01-25 ENCOUNTER — Ambulatory Visit

## 2025-01-25 ENCOUNTER — Encounter

## 2025-03-09 ENCOUNTER — Ambulatory Visit: Admitting: Dermatology

## 2025-03-09 ENCOUNTER — Ambulatory Visit: Admitting: Neurology
